# Patient Record
Sex: Female | Born: 1935 | Race: White | Hispanic: No | Smoking: Never smoker
Health system: Southern US, Community
[De-identification: ages and names within clinical notes are randomized; demographics above are authoritative.]

## PROBLEM LIST (undated history)

## (undated) DIAGNOSIS — I10 Essential (primary) hypertension: Secondary | ICD-10-CM

## (undated) DIAGNOSIS — T7840XA Allergy, unspecified, initial encounter: Secondary | ICD-10-CM

## (undated) DIAGNOSIS — F419 Anxiety disorder, unspecified: Secondary | ICD-10-CM

## (undated) DIAGNOSIS — K219 Gastro-esophageal reflux disease without esophagitis: Secondary | ICD-10-CM

## (undated) DIAGNOSIS — J189 Pneumonia, unspecified organism: Secondary | ICD-10-CM

## (undated) DIAGNOSIS — IMO0001 Reserved for inherently not codable concepts without codable children: Secondary | ICD-10-CM

## (undated) DIAGNOSIS — K648 Other hemorrhoids: Secondary | ICD-10-CM

## (undated) DIAGNOSIS — F32A Depression, unspecified: Secondary | ICD-10-CM

## (undated) DIAGNOSIS — F329 Major depressive disorder, single episode, unspecified: Secondary | ICD-10-CM

## (undated) DIAGNOSIS — K5790 Diverticulosis of intestine, part unspecified, without perforation or abscess without bleeding: Secondary | ICD-10-CM

## (undated) DIAGNOSIS — G4700 Insomnia, unspecified: Secondary | ICD-10-CM

## (undated) DIAGNOSIS — D649 Anemia, unspecified: Secondary | ICD-10-CM

## (undated) DIAGNOSIS — K635 Polyp of colon: Secondary | ICD-10-CM

## (undated) DIAGNOSIS — Z5189 Encounter for other specified aftercare: Secondary | ICD-10-CM

## (undated) DIAGNOSIS — M1990 Unspecified osteoarthritis, unspecified site: Secondary | ICD-10-CM

## (undated) HISTORY — DX: Allergy, unspecified, initial encounter: T78.40XA

## (undated) HISTORY — DX: Major depressive disorder, single episode, unspecified: F32.9

## (undated) HISTORY — PX: COLON SURGERY: SHX602

## (undated) HISTORY — DX: Other hemorrhoids: K64.8

## (undated) HISTORY — PX: UPPER GASTROINTESTINAL ENDOSCOPY: SHX188

## (undated) HISTORY — DX: Gastro-esophageal reflux disease without esophagitis: K21.9

## (undated) HISTORY — DX: Reserved for inherently not codable concepts without codable children: IMO0001

## (undated) HISTORY — DX: Polyp of colon: K63.5

## (undated) HISTORY — DX: Depression, unspecified: F32.A

## (undated) HISTORY — DX: Insomnia, unspecified: G47.00

## (undated) HISTORY — DX: Anemia, unspecified: D64.9

## (undated) HISTORY — DX: Diverticulosis of intestine, part unspecified, without perforation or abscess without bleeding: K57.90

## (undated) HISTORY — DX: Unspecified osteoarthritis, unspecified site: M19.90

## (undated) HISTORY — DX: Essential (primary) hypertension: I10

## (undated) HISTORY — PX: CHOLECYSTECTOMY: SHX55

---

## 1994-10-18 HISTORY — PX: LOW ANTERIOR BOWEL RESECTION: SUR1240

## 1998-08-18 ENCOUNTER — Other Ambulatory Visit: Admission: RE | Admit: 1998-08-18 | Discharge: 1998-08-18 | Payer: Self-pay | Admitting: Obstetrics and Gynecology

## 1998-10-02 ENCOUNTER — Other Ambulatory Visit: Admission: RE | Admit: 1998-10-02 | Discharge: 1998-10-02 | Payer: Self-pay | Admitting: Gastroenterology

## 1998-11-10 ENCOUNTER — Ambulatory Visit (HOSPITAL_COMMUNITY): Admission: RE | Admit: 1998-11-10 | Discharge: 1998-11-10 | Payer: Self-pay | Admitting: *Deleted

## 1999-04-01 ENCOUNTER — Ambulatory Visit (HOSPITAL_COMMUNITY): Admission: RE | Admit: 1999-04-01 | Discharge: 1999-04-01 | Payer: Self-pay | Admitting: Obstetrics and Gynecology

## 1999-04-01 ENCOUNTER — Encounter: Payer: Self-pay | Admitting: Obstetrics and Gynecology

## 1999-10-01 ENCOUNTER — Other Ambulatory Visit: Admission: RE | Admit: 1999-10-01 | Discharge: 1999-10-01 | Payer: Self-pay | Admitting: Obstetrics and Gynecology

## 1999-10-01 ENCOUNTER — Ambulatory Visit (HOSPITAL_COMMUNITY): Admission: RE | Admit: 1999-10-01 | Discharge: 1999-10-01 | Payer: Self-pay | Admitting: Family Medicine

## 1999-10-01 ENCOUNTER — Encounter: Payer: Self-pay | Admitting: Obstetrics and Gynecology

## 2001-10-20 ENCOUNTER — Other Ambulatory Visit: Admission: RE | Admit: 2001-10-20 | Discharge: 2001-10-20 | Payer: Self-pay | Admitting: Obstetrics and Gynecology

## 2002-06-04 ENCOUNTER — Encounter: Admission: RE | Admit: 2002-06-04 | Discharge: 2002-06-04 | Payer: Self-pay | Admitting: Obstetrics and Gynecology

## 2002-06-04 ENCOUNTER — Encounter: Payer: Self-pay | Admitting: Obstetrics and Gynecology

## 2004-01-03 ENCOUNTER — Other Ambulatory Visit: Admission: RE | Admit: 2004-01-03 | Discharge: 2004-01-03 | Payer: Self-pay | Admitting: Obstetrics and Gynecology

## 2004-08-27 ENCOUNTER — Ambulatory Visit: Payer: Self-pay | Admitting: Gastroenterology

## 2004-10-05 ENCOUNTER — Ambulatory Visit: Payer: Self-pay | Admitting: Gastroenterology

## 2005-01-20 ENCOUNTER — Other Ambulatory Visit: Admission: RE | Admit: 2005-01-20 | Discharge: 2005-01-20 | Payer: Self-pay | Admitting: Obstetrics and Gynecology

## 2005-08-19 ENCOUNTER — Ambulatory Visit: Payer: Self-pay | Admitting: Gastroenterology

## 2005-11-24 ENCOUNTER — Ambulatory Visit: Payer: Self-pay | Admitting: Gastroenterology

## 2006-01-04 ENCOUNTER — Ambulatory Visit: Payer: Self-pay | Admitting: Gastroenterology

## 2007-04-06 ENCOUNTER — Emergency Department (HOSPITAL_COMMUNITY): Admission: EM | Admit: 2007-04-06 | Discharge: 2007-04-07 | Payer: Self-pay | Admitting: Emergency Medicine

## 2007-04-14 ENCOUNTER — Emergency Department (HOSPITAL_COMMUNITY): Admission: EM | Admit: 2007-04-14 | Discharge: 2007-04-14 | Payer: Self-pay | Admitting: Emergency Medicine

## 2008-11-05 ENCOUNTER — Encounter: Admission: RE | Admit: 2008-11-05 | Discharge: 2008-11-05 | Payer: Self-pay | Admitting: Family Medicine

## 2010-01-20 ENCOUNTER — Encounter: Admission: RE | Admit: 2010-01-20 | Discharge: 2010-01-20 | Payer: Self-pay | Admitting: Family Medicine

## 2011-01-07 ENCOUNTER — Telehealth: Payer: Self-pay | Admitting: Internal Medicine

## 2011-01-07 NOTE — Telephone Encounter (Signed)
Susan Henry states pt was seen in their office today with blood in her stool.  Patient has history with Dr Corinda Gubler and will come in and see Dr Leone Payor tomorrow at 11:00.  Susan Henry will notify the patient and send records.

## 2011-01-08 ENCOUNTER — Other Ambulatory Visit (INDEPENDENT_AMBULATORY_CARE_PROVIDER_SITE_OTHER): Payer: Medicare Other | Admitting: Internal Medicine

## 2011-01-08 ENCOUNTER — Other Ambulatory Visit (INDEPENDENT_AMBULATORY_CARE_PROVIDER_SITE_OTHER): Payer: Medicare Other

## 2011-01-08 ENCOUNTER — Ambulatory Visit (INDEPENDENT_AMBULATORY_CARE_PROVIDER_SITE_OTHER): Payer: Medicare Other | Admitting: Internal Medicine

## 2011-01-08 ENCOUNTER — Encounter: Payer: Self-pay | Admitting: Internal Medicine

## 2011-01-08 VITALS — BP 126/64 | HR 60 | Ht 66.0 in | Wt 155.0 lb

## 2011-01-08 DIAGNOSIS — D649 Anemia, unspecified: Secondary | ICD-10-CM

## 2011-01-08 DIAGNOSIS — K648 Other hemorrhoids: Secondary | ICD-10-CM

## 2011-01-08 DIAGNOSIS — K625 Hemorrhage of anus and rectum: Secondary | ICD-10-CM

## 2011-01-08 DIAGNOSIS — D509 Iron deficiency anemia, unspecified: Secondary | ICD-10-CM

## 2011-01-08 HISTORY — DX: Other hemorrhoids: K64.8

## 2011-01-08 LAB — CBC WITH DIFFERENTIAL/PLATELET
Basophils Relative: 0.5 % (ref 0.0–3.0)
Eosinophils Relative: 1.2 % (ref 0.0–5.0)
Lymphocytes Relative: 25.7 % (ref 12.0–46.0)
MCV: 93.2 fl (ref 78.0–100.0)
Neutrophils Relative %: 64.9 % (ref 43.0–77.0)
RBC: 3.34 Mil/uL — ABNORMAL LOW (ref 3.87–5.11)
WBC: 6.5 10*3/uL (ref 4.5–10.5)

## 2011-01-08 LAB — VITAMIN B12: Vitamin B-12: 424 pg/mL (ref 211–911)

## 2011-01-08 LAB — IBC PANEL
Iron: 59 ug/dL (ref 42–145)
Saturation Ratios: 13 % — ABNORMAL LOW (ref 20.0–50.0)
Transferrin: 323.8 mg/dL (ref 212.0–360.0)

## 2011-01-08 MED ORDER — HYDROCORTISONE 2.5 % RE CREA: TOPICAL_CREAM | Freq: Two times a day (BID) | RECTAL | Status: AC

## 2011-01-08 NOTE — Assessment & Plan Note (Signed)
She has described a single episode of progressive blood on the toilet paper 3 days ago. She has hemorrhoids noted from prior colonoscopies in 2007 and 2001. Anoscopic exam today demonstrates inflamed internal hemorrhoids. I think this is the most likely cause of this rectal bleeding. Her story today is somewhat inconsistent with the notes from [place] , but she says she only had one episode of bleeding on the toilet paper and confirms that with me. Will treat with hydrocortisone cream at this point. She does have a very mild anemia with hemoglobin 11.1 and a normal MCV. Given that she had a colonoscopy in 2007 with only a 4 mm polyp, and internal/external hemorrhoids, I don't think we need to pursue a colonoscopy based upon what we know now. That may be needed depending upon the anemia workup. I will review her paper chart and records further. Her colonoscopy in December of 2001 showed a 4 mm polyp as well she does also have diverticulosis.

## 2011-01-08 NOTE — Assessment & Plan Note (Signed)
Her hemoglobin is 11.1 with a normal MCV on labs yesterday. I don't know if this is new or old at this point as I have no frame of reference. We'll repeat a CBC as well as iron 10 B12 studies and determine further plans pending that.

## 2011-01-08 NOTE — Patient Instructions (Signed)
You were slightly anemic but seems unlikely to be due to the one episode of rectal bleeding and hemorrhoids. Will recheck CBC and do anemia studies. Please pick up your hemorrhoid cream at the pharmacy. Let us know if there is persistent bleeding trouble or changes. We will notify you about lab results next week and will let Dr. Kevan Ny know also.

## 2011-01-08 NOTE — Progress Notes (Signed)
  Subjective:    Patient ID: Susan Henry, female    DOB: 1924/11/17, 77 y.o.   MRN: [id number]  Rectal Bleeding  The current episode started 3 to 5 days ago. The onset was sudden. The problem has been resolved. The patient is experiencing no pain. The stool is described as soft.    Bright red blood per rectum occurred one time 3 days ago. Saw Dr. Kevan Ny yesterday and was sent to me. CBC ordered, was told it was normal. No bleeding since then. Normal bowel movement since. Denies change in bowels, constipation or diarrhea recently. She cannot recall other bleeding. No abdominal pain. Blood was only on the toilet paper.  Review of Systems  Gastrointestinal: Positive for hematochezia.  Musculoskeletal: Positive for back pain.   all other review of systems negative or as per the history of present illness     Objective:   Physical Exam  Constitutional: She is oriented to person, place, and time. She appears well-developed and well-nourished.  HENT:  Head: Normocephalic.  Neck: Neck supple. No thyromegaly present.  Cardiovascular: Normal rate and regular rhythm.   Murmur heard.      2/6 RUSB murmur  Pulmonary/Chest: Effort normal and breath sounds normal.  Abdominal: Soft. Bowel sounds are normal. She exhibits no mass. There is no rebound and no guarding.  Genitourinary:        With female staff present there is a small tag in the anoderm. There is brown stool but no rectal mass and normal resting tone.  anoscopy is performed demonstrating inflamed irritated hemorrhoids and anal canal.  Musculoskeletal: She exhibits no edema.  Neurological: She is alert and oriented to person, place, and time.  Skin: Skin is warm and dry.  Psychiatric: She has a normal mood and affect. Her behavior is normal.          Assessment & Plan:

## 2011-01-11 ENCOUNTER — Telehealth: Payer: Self-pay | Admitting: Internal Medicine

## 2011-01-11 ENCOUNTER — Telehealth: Payer: Self-pay | Admitting: *Deleted

## 2011-01-11 MED ORDER — FERROUS SULFATE 325 (65 FE) MG PO TABS: 325.0000 mg | ORAL_TABLET | Freq: Two times a day (BID) | ORAL | Status: DC

## 2011-01-11 NOTE — Telephone Encounter (Signed)
Call patient and explain that iron is low. B12 was ok. She will need to be scheduled for a colonoscopy to investigate iron deficiency anemia. I still think bleeding episode was from hemorrhoids but even if that were not occurring a colonoscopy is appropriate. Depending upon what that sows she could need other testing but will wait for that. She will need to  ferrous sulfate 325 mg 2 times a day to replace iron (this is OTC) and I ordered it into med list She can have a pre-visit and then hold the ferrous sulfate as per protocol after that Stan Head

## 2011-01-12 NOTE — Telephone Encounter (Signed)
Notified pt of Dr Marvell Fuller recommendations for a COLON d/t her iron being low and that her B12 was ok. She will need to take ferrous sulfate 325mg , 1 bid.  PV scheduled for 02/01/11 @ 1100 and COLON for 02/16/11 @2pm . Per Dr Leone Payor, she will hold the Ferrous Sulfate per protocol for COLON. Pt stated understanding.

## 2011-01-13 ENCOUNTER — Telehealth: Payer: Self-pay | Admitting: Internal Medicine

## 2011-01-13 NOTE — Telephone Encounter (Signed)
Pt's daughter wanted an earlier appt for pt since she had Iron deficiency anemia and she didn't want pt to know. R/S pt for Pre Visit for 01/15/11 @ 1:30 and her  COLON, 01/20/11  2:30pm. Pt stated understanding.

## 2011-01-15 ENCOUNTER — Encounter: Payer: Self-pay | Admitting: Internal Medicine

## 2011-01-15 ENCOUNTER — Ambulatory Visit (AMBULATORY_SURGERY_CENTER): Payer: Medicare Other | Admitting: *Deleted

## 2011-01-15 VITALS — Ht 67.0 in | Wt 158.0 lb

## 2011-01-15 DIAGNOSIS — D509 Iron deficiency anemia, unspecified: Secondary | ICD-10-CM

## 2011-01-15 DIAGNOSIS — Z1211 Encounter for screening for malignant neoplasm of colon: Secondary | ICD-10-CM

## 2011-01-15 MED ORDER — PEG-KCL-NACL-NASULF-NA ASC-C 100 G PO SOLR: 1.0000 | Freq: Once | ORAL | Status: AC

## 2011-01-17 HISTORY — PX: COLONOSCOPY: SHX174

## 2011-01-18 ENCOUNTER — Telehealth: Payer: Self-pay | Admitting: Internal Medicine

## 2011-01-18 NOTE — Telephone Encounter (Signed)
Unable to reach pt- no answering machine- will call back.

## 2011-01-18 NOTE — Telephone Encounter (Signed)
Should not be a problem re: ferrous sulfate. Ok to proceed wih colonoscopy No more ferrous sulfate for now

## 2011-01-18 NOTE — Telephone Encounter (Signed)
Notified pt's husband Dr Leone Payor stated to stop the iron now until her procedure; he stated understanding.

## 2011-01-18 NOTE — Telephone Encounter (Signed)
Pt's husband called to ask about the iron supplement pt is taking. Pt has her COLON on 01/20/11 @ 2:30pm. Pt was r/s to an earlier day per her daughter's request- previous appt was 02/16/11. Daughter didn't want her mom waiting that long, but she didn't want me to tell her mom she had called. I informed pt we had an earlier appt d/t cancellations. Anyway, pt should have started the ferrous sulfate around 01/12/11. Husband read the pre visit notes and stated to stop the iron 2 weeks prior to COLON. Pt could not have been on drug < 1 week, will this make a difference? Thanks.

## 2011-01-20 ENCOUNTER — Encounter: Payer: Self-pay | Admitting: Internal Medicine

## 2011-01-20 ENCOUNTER — Ambulatory Visit (AMBULATORY_SURGERY_CENTER): Payer: Medicare Other | Admitting: Internal Medicine

## 2011-01-20 VITALS — BP 149/84 | HR 64 | Temp 98.1°F | Resp 20 | Ht 67.0 in | Wt 170.0 lb

## 2011-01-20 DIAGNOSIS — K5730 Diverticulosis of large intestine without perforation or abscess without bleeding: Secondary | ICD-10-CM

## 2011-01-20 DIAGNOSIS — K625 Hemorrhage of anus and rectum: Secondary | ICD-10-CM

## 2011-01-20 DIAGNOSIS — K648 Other hemorrhoids: Secondary | ICD-10-CM

## 2011-01-20 DIAGNOSIS — D649 Anemia, unspecified: Secondary | ICD-10-CM

## 2011-01-20 DIAGNOSIS — D509 Iron deficiency anemia, unspecified: Secondary | ICD-10-CM

## 2011-01-20 MED ORDER — SODIUM CHLORIDE 0.9 % IV SOLN: 500.0000 mL | INTRAVENOUS | Status: DC

## 2011-01-20 NOTE — Patient Instructions (Signed)
No cause of your iron-deficiency anemia was seen on the colonoscopy today. It may be due to lack of iron ingestion but it is appropriate for you to schedule an upper GI endoscopy to evaluate for microscopic blood loss from the upper GI tract. My office will schedule that if you agree.

## 2011-01-21 ENCOUNTER — Telehealth: Payer: Self-pay | Admitting: *Deleted

## 2011-01-21 NOTE — Telephone Encounter (Signed)

## 2011-01-27 ENCOUNTER — Telehealth: Payer: Self-pay

## 2011-01-27 NOTE — Telephone Encounter (Signed)
No answer and no machine, I will continue to try and reach the patient 

## 2011-01-28 NOTE — Telephone Encounter (Signed)
I spoke with the patient about setting up an EGD as suggested on her colonoscopy report.  Patient declines to schedule at this time.

## 2011-02-16 ENCOUNTER — Other Ambulatory Visit: Payer: Medicare Other | Admitting: Internal Medicine

## 2011-02-23 ENCOUNTER — Encounter: Payer: Self-pay | Admitting: Internal Medicine

## 2011-02-23 NOTE — Telephone Encounter (Signed)
This encounter was created in error - please disregard.

## 2011-03-16 ENCOUNTER — Emergency Department (HOSPITAL_COMMUNITY)
Admission: EM | Admit: 2011-03-16 | Discharge: 2011-03-16 | Disposition: A | Discharge status: Home / Self Care | Payer: Medicare Other | Attending: Emergency Medicine | Admitting: Emergency Medicine

## 2011-03-16 ENCOUNTER — Emergency Department (HOSPITAL_COMMUNITY): Payer: Medicare Other

## 2011-03-16 DIAGNOSIS — W010XXA Fall on same level from slipping, tripping and stumbling without subsequent striking against object, initial encounter: Secondary | ICD-10-CM | POA: Insufficient documentation

## 2011-03-16 DIAGNOSIS — I10 Essential (primary) hypertension: Secondary | ICD-10-CM | POA: Insufficient documentation

## 2011-03-16 DIAGNOSIS — S01501A Unspecified open wound of lip, initial encounter: Secondary | ICD-10-CM | POA: Insufficient documentation

## 2011-03-16 DIAGNOSIS — S0003XA Contusion of scalp, initial encounter: Secondary | ICD-10-CM | POA: Insufficient documentation

## 2011-03-16 DIAGNOSIS — S0083XA Contusion of other part of head, initial encounter: Secondary | ICD-10-CM | POA: Insufficient documentation

## 2011-11-03 ENCOUNTER — Other Ambulatory Visit: Payer: Self-pay | Admitting: Internal Medicine

## 2011-11-04 NOTE — Telephone Encounter (Signed)
Called the patient to see what medication she needed a refill on. Patient couldn't give me any kind of information, she didn't know what medication it was except that the pharmacy told her it was not habit forming. I told the patient that I couldn't do anything if she couldn't tell me what medication she needed. Patient requested a copy of her medication list mailed to her maybe then she should be able to figure it out.

## 2011-11-10 ENCOUNTER — Other Ambulatory Visit: Payer: Self-pay | Admitting: Obstetrics and Gynecology

## 2011-11-16 ENCOUNTER — Inpatient Hospital Stay (HOSPITAL_COMMUNITY)
Admission: AD | Admit: 2011-11-16 | Discharge: 2011-11-25 | DRG: 378 | Disposition: A | Discharge status: Home / Self Care | Payer: Medicare Other | Source: Ambulatory Visit | Attending: Internal Medicine | Admitting: Internal Medicine

## 2011-11-16 ENCOUNTER — Encounter: Payer: Self-pay | Admitting: Internal Medicine

## 2011-11-16 ENCOUNTER — Ambulatory Visit (INDEPENDENT_AMBULATORY_CARE_PROVIDER_SITE_OTHER): Payer: Medicare Other | Admitting: Internal Medicine

## 2011-11-16 DIAGNOSIS — F3289 Other specified depressive episodes: Secondary | ICD-10-CM | POA: Diagnosis present

## 2011-11-16 DIAGNOSIS — K922 Gastrointestinal hemorrhage, unspecified: Secondary | ICD-10-CM | POA: Diagnosis present

## 2011-11-16 DIAGNOSIS — E785 Hyperlipidemia, unspecified: Secondary | ICD-10-CM | POA: Diagnosis present

## 2011-11-16 DIAGNOSIS — M129 Arthropathy, unspecified: Secondary | ICD-10-CM | POA: Diagnosis present

## 2011-11-16 DIAGNOSIS — K5731 Diverticulosis of large intestine without perforation or abscess with bleeding: Principal | ICD-10-CM | POA: Diagnosis present

## 2011-11-16 DIAGNOSIS — F329 Major depressive disorder, single episode, unspecified: Secondary | ICD-10-CM | POA: Diagnosis present

## 2011-11-16 DIAGNOSIS — D62 Acute posthemorrhagic anemia: Secondary | ICD-10-CM | POA: Diagnosis not present

## 2011-11-16 DIAGNOSIS — Z860100 Personal history of colon polyps, unspecified: Secondary | ICD-10-CM

## 2011-11-16 DIAGNOSIS — K5730 Diverticulosis of large intestine without perforation or abscess without bleeding: Secondary | ICD-10-CM

## 2011-11-16 DIAGNOSIS — I10 Essential (primary) hypertension: Secondary | ICD-10-CM | POA: Insufficient documentation

## 2011-11-16 DIAGNOSIS — K219 Gastro-esophageal reflux disease without esophagitis: Secondary | ICD-10-CM | POA: Diagnosis present

## 2011-11-16 HISTORY — DX: Pneumonia, unspecified organism: J18.9

## 2011-11-16 HISTORY — DX: Encounter for other specified aftercare: Z51.89

## 2011-11-16 HISTORY — DX: Anxiety disorder, unspecified: F41.9

## 2011-11-16 HISTORY — DX: Reserved for inherently not codable concepts without codable children: IMO0001

## 2011-11-16 LAB — CBC
HCT: 38.3 % (ref 36.0–46.0)
MCHC: 33.7 g/dL (ref 30.0–36.0)
MCV: 97 fL (ref 78.0–100.0)
RDW: 12.5 % (ref 11.5–15.5)

## 2011-11-16 LAB — COMPREHENSIVE METABOLIC PANEL
AST: 23 U/L (ref 0–37)
Albumin: 3.9 g/dL (ref 3.5–5.2)
Alkaline Phosphatase: 99 U/L (ref 39–117)
BUN: 17 mg/dL (ref 6–23)
Chloride: 98 mEq/L (ref 96–112)
Creatinine, Ser: 0.77 mg/dL (ref 0.50–1.10)
GFR calc Af Amer: 87 mL/min — ABNORMAL LOW (ref >90)
Glucose, Bld: 123 mg/dL — ABNORMAL HIGH (ref 70–99)
Total Bilirubin: 0.4 mg/dL (ref 0.3–1.2)
Total Protein: 8 g/dL (ref 6.0–8.3)

## 2011-11-16 LAB — ABO/RH: ABO/RH(D): A POS

## 2011-11-16 LAB — PROTIME-INR
INR: 1.02 (ref 0.00–1.49)
Prothrombin Time: 13.6 seconds (ref 11.6–15.2)

## 2011-11-16 MED ORDER — ONDANSETRON HCL 4 MG PO TABS: 4.0000 mg | ORAL_TABLET | Freq: Four times a day (QID) | ORAL | Status: DC | PRN

## 2011-11-16 MED ORDER — LORAZEPAM 1 MG PO TABS
1.0000 mg | ORAL_TABLET | Freq: Three times a day (TID) | ORAL | Status: DC | PRN
  Administered 2011-11-16 – 2011-11-24 (×8): 1 mg via ORAL
  Filled 2011-11-16 (×9): qty 1

## 2011-11-16 MED ORDER — FLUTICASONE PROPIONATE 50 MCG/ACT NA SUSP
2.0000 | Freq: Every day | NASAL | Status: DC
  Administered 2011-11-20 – 2011-11-24 (×3): 2 via NASAL
  Filled 2011-11-16: qty 16

## 2011-11-16 MED ORDER — CITALOPRAM HYDROBROMIDE 20 MG PO TABS
20.0000 mg | ORAL_TABLET | Freq: Every day | ORAL | Status: DC
  Filled 2011-11-16: qty 1

## 2011-11-16 MED ORDER — ONDANSETRON HCL 4 MG/2ML IJ SOLN: 4.0000 mg | Freq: Three times a day (TID) | INTRAMUSCULAR | Status: DC | PRN

## 2011-11-16 MED ORDER — ACETAMINOPHEN 325 MG PO TABS
650.0000 mg | ORAL_TABLET | Freq: Four times a day (QID) | ORAL | Status: DC | PRN
  Administered 2011-11-16 – 2011-11-17 (×2): 650 mg via ORAL
  Filled 2011-11-16 (×3): qty 2

## 2011-11-16 MED ORDER — PANTOPRAZOLE SODIUM 40 MG PO TBEC
40.0000 mg | DELAYED_RELEASE_TABLET | Freq: Every day | ORAL | Status: DC
  Administered 2011-11-17 – 2011-11-25 (×8): 40 mg via ORAL
  Filled 2011-11-16 (×12): qty 1

## 2011-11-16 MED ORDER — ACETAMINOPHEN 650 MG RE SUPP: 650.0000 mg | Freq: Four times a day (QID) | RECTAL | Status: DC | PRN

## 2011-11-16 MED ORDER — HYDROCODONE-ACETAMINOPHEN 5-325 MG PO TABS: 1.0000 | ORAL_TABLET | ORAL | Status: DC | PRN

## 2011-11-16 MED ORDER — FUROSEMIDE 20 MG PO TABS
20.0000 mg | ORAL_TABLET | Freq: Every day | ORAL | Status: DC
  Administered 2011-11-17 – 2011-11-24 (×7): 20 mg via ORAL
  Filled 2011-11-16 (×10): qty 1

## 2011-11-16 MED ORDER — KCL IN DEXTROSE-NACL 10-5-0.45 MEQ/L-%-% IV SOLN
INTRAVENOUS | Status: DC
  Administered 2011-11-16 – 2011-11-18 (×4): via INTRAVENOUS
  Filled 2011-11-16 (×8): qty 1000

## 2011-11-16 NOTE — Progress Notes (Addendum)
Subjective:    Patient ID: Susan Henry, female    DOB: July 16, 1925, 78 y.o.   MRN: [id number]  HPI Elderly white woman with known hemorrhoids. She had a colonoscopy in 01/2011. She developed rectal bleeding this AM. Bright red blood on the toilet paper. Daughter is here and says that the patient's that she saw blood in the stool in the commode.Mild abdominal cramps. Denies constipation or bowel habit changes. No heavy lifting. Denies other episodes of bleeding, just wants this morning. She was last seen in the spring of 2012. She had some rectal bleeding that I thought was from hemorrhoids. She underwent a colonoscopy because she had a low ferritin, and a hemoglobin of 10. It demonstrated hemorrhoids and diverticulosis. I recommended an upper endoscopy for completeness but she did not follow through with that. I do not know what happened with her anemia since then. She has since stopped taking iron. She denies lightheadedness, chest pain or significant dyspnea. She is somewhat anxious over the bleeding. Allergies  Allergen Reactions  . Alprazolam Er Rash   Outpatient Prescriptions Prior to Visit  Medication Sig Dispense Refill  . amLODipine (NORVASC) 5 MG tablet Take 5 mg by mouth daily.        . bisoprolol (ZEBETA) 5 MG tablet Take 5 mg by mouth daily.        . Calcium Carbonate (CALCIUM 600 PO) Take 1 tablet by mouth daily.        . citalopram (CELEXA) 20 MG tablet Take 20 mg by mouth daily.        . ergocalciferol (VITAMIN D2) 50000 UNITS capsule Take 50,000 Units by mouth once a week.        . furosemide (LASIX) 20 MG tablet Take 20 mg by mouth daily.        Marland Kitchen lisinopril (PRINIVIL,ZESTRIL) 20 MG tablet Take 20 mg by mouth daily.        . RABEprazole (ACIPHEX) 20 MG tablet Take 20 mg by mouth daily.        . ferrous sulfate 325 (65 FE) MG tablet Take 1 tablet (325 mg total) by mouth 2 (two) times daily.      . fluticasone (FLONASE) 50 MCG/ACT nasal spray 2 sprays by Nasal route daily.         Marland Kitchen LORazepam (ATIVAN) 1 MG tablet Take 1 mg by mouth every 8 (eight) hours.        . Multiple Vitamins-Minerals (MULTIVITAL PO) Take 1 tablet by mouth daily.        Marland Kitchen PROCTOSOL HC 2.5 % rectal cream Place 1 application rectally at bedtime. Prefers suppositories       Facility-Administered Medications Prior to Visit  Medication Dose Route Frequency Provider Last Rate Last Dose  . 0.9 %  sodium chloride infusion  500 mL Intravenous Continuous Iva Boop, MD       Past Medical History  Diagnosis Date  . Hypertension   . Reflux   . Hyperlipidemia   . Depression   . Aortic sclerosis   . Insomnia   . Colon polyps     Hx of  adenomatous  . Diverticulosis   . Hemorrhoids, internal, with bleeding 01/08/2011  . Arthritis   . GERD (gastroesophageal reflux disease)   . Anemia     Iron deficiency   . Allergy     Seasonal   Past Surgical History  Procedure Date  . Cholecystectomy   . Upper gastrointestinal endoscopy   . Low anterior  resection for diverticulitis   . Colonoscopy 01/2011   History   Social History  . Marital Status: Married, adult daughter here today             .     Occupational History  . Unemployed      Homemaker   Social History Main Topics  . Smoking status: Never Smoker   . Smokeless tobacco: Never Used  . Alcohol Use: 3.5 oz/week    7 drink(s) per week     rare  . Drug Use: No  .       Family History  Problem Relation Age of Onset  . Colon cancer Neg Hx   . Pancreatic cancer Father          Review of Systems Mild cough lately, question allergies. Wears eyeglasses. All other review of systems appears negative at this time. Except as mentioned in the history of present illness.    Objective:   Physical Exam General:  Well-developed, well-nourished and in no acute distress Eyes:  anicteric. ENT:   Mouth and posterior pharynx free of lesions.  Neck:   supple w/o thyromegaly or mass.  Lungs: Clear to auscultation  bilaterally. Heart:  S1S2, no rubs, murmurs, gallops. Abdomen:  soft, non-tender, no hepatosplenomegaly, hernia, or mass and BS+.  Rectal: With female staff present, normal anal determine whether there is a slight amount of blood on the outside. Rectal exam shows maroon stool. Anoscopy confirms this, her hemorrhoids do   not look active. Lymph:  no cervical or supraclavicular adenopathy. Extremities:   no edema Neuro:  A&O x 3. , Grossly nonfocal Psych:  appropriate mood and affect.   Last colonoscopy 01/20/2011. Severe left-sided diverticulosis, mild right-sided diverticulosis, internal hemorrhoids.      Assessment & Plan:   1. Gastrointestinal hemorrhage    This sounds like a diverticular bleed as it is painless and she has known diverticulosis. The color of stools meds that. I did not get a history of melena. She needs to be admitted to the hospital for close observation and consideration for further workup depending upon the clinical course. Will hold her antihypertensives, give IV fluids, put on a clear liquid diet and sort things out further as the clinical course progresses. Her daughter and husband are here in and explain this to all 3. She could need repeat endoscopy, upper endoscopy versus colonoscopy or other studies to try to determine the source of bleeding. Typically diverticular bleeding is self-limited and if that is the overall course would observe and support if that is possible. Her Celexa is a bleeding risk factor, and will hold that at least temporarily, monitoring for withdrawal

## 2011-11-16 NOTE — Patient Instructions (Signed)
Go to [facility] admitting for bed placement (in-patient) for GI bleed. You will be admitted to [address].

## 2011-11-16 NOTE — Progress Notes (Signed)
Addended by: Sammuel Cooper on: 11/16/2011 04:39 PM   Modules accepted: Orders

## 2011-11-16 NOTE — H&P (Signed)
[facility] History and Physical    Patient ID: Susan Henry, female    DOB: November 25, 1924, 78 y.o.   MRN: [id number]   HPI Elderly white woman with known hemorrhoids. She had a colonoscopy in 01/2011. She developed rectal bleeding this AM. Bright red blood on the toilet paper. Daughter is here and says that the patient's that she saw blood in the stool in the commode.Mild abdominal cramps. Denies constipation or bowel habit changes. No heavy lifting. Denies other episodes of bleeding, just wants this morning. She was last seen in the spring of 2012. She had some rectal bleeding that I thought was from hemorrhoids. She underwent a colonoscopy because she had a low ferritin, and a hemoglobin of 10. It demonstrated hemorrhoids and diverticulosis. I recommended an upper endoscopy for completeness but she did not follow through with that. I do not know what happened with her anemia since then. She has since stopped taking iron. She denies lightheadedness, chest pain or significant dyspnea. She is somewhat anxious over the bleeding.  Allergies   Allergen  Reactions   .  Alprazolam Er  Rash    Outpatient Prescriptions Prior to Visit   Medication  Sig  Dispense  Refill   .  amLODipine (NORVASC) 5 MG tablet  Take 5 mg by mouth daily.           .  bisoprolol (ZEBETA) 5 MG tablet  Take 5 mg by mouth daily.           .  Calcium Carbonate (CALCIUM 600 PO)  Take 1 tablet by mouth daily.           .  citalopram (CELEXA) 20 MG tablet  Take 20 mg by mouth daily.           .  ergocalciferol (VITAMIN D2) 50000 UNITS capsule  Take 50,000 Units by mouth once a week.           .  furosemide (LASIX) 20 MG tablet  Take 20 mg by mouth daily.           Marland Kitchen  lisinopril (PRINIVIL,ZESTRIL) 20 MG tablet  Take 20 mg by mouth daily.           .  RABEprazole (ACIPHEX) 20 MG tablet  Take 20 mg by mouth daily.           .  ferrous sulfate 325 (65 FE) MG tablet  Take 1 tablet (325 mg total) by mouth 2 (two) times  daily.         .  fluticasone (FLONASE) 50 MCG/ACT nasal spray  2 sprays by Nasal route daily.           Marland Kitchen  LORazepam (ATIVAN) 1 MG tablet  Take 1 mg by mouth every 8 (eight) hours.           .  Multiple Vitamins-Minerals (MULTIVITAL PO)  Take 1 tablet by mouth daily.           Marland Kitchen  PROCTOSOL HC 2.5 % rectal cream  Place 1 application rectally at bedtime. Prefers suppositories             Facility-Administered Medications Prior to Visit   Medication  Dose  Route  Frequency  Provider  Last Rate  Last Dose   .  0.9 %  sodium chloride infusion   500 mL  Intravenous  Continuous  Iva Boop, MD          Past Medical History  Diagnosis  Date   .  Hypertension     .  Reflux     .  Hyperlipidemia     .  Depression     .  Aortic sclerosis     .  Insomnia     .  Colon polyps         Hx of  adenomatous   .  Diverticulosis     .  Hemorrhoids, internal, with bleeding  01/08/2011   .  Arthritis     .  GERD (gastroesophageal reflux disease)     .  Anemia         Iron deficiency    .  Allergy         Seasonal    Past Surgical History   Procedure  Date   .  Cholecystectomy     .  Upper gastrointestinal endoscopy     .  Low anterior resection for diverticulitis     .  Colonoscopy  01/2011       History       Social History   .  Marital Status:  Married, adult daughter here today                        Occupational History   .  Unemployed          Homemaker       Social History Main Topics   .  Smoking status:  Never Smoker    .  Smokeless tobacco:  Never Used   .  Alcohol Use:  3.5 oz/week       7 drink(s) per week         rare   .  Drug Use:  No            Family History   Problem  Relation  Age of Onset   .  Colon cancer  Neg Hx     .  Pancreatic cancer  Father        Review of Systems Mild cough lately, question allergies. Wears eyeglasses. All other review of systems appears negative at this time. Except as mentioned in the history of present illness.     Objective:    Physical Exam     BP Pulse Ht Wt BMI    128/64  68  5\' 6"  (1.676 m)  163 lb (73.936 kg)  26.31 kg/m2   General:         Well-developed, well-nourished and in no acute distress Eyes:               anicteric. ENT:               Mouth and posterior pharynx free of lesions.  Neck:              supple w/o thyromegaly or mass.  Lungs:            Clear to auscultation bilaterally. Heart:             S1S2, no rubs, murmurs, gallops. Abdomen:        soft, non-tender, no hepatosplenomegaly, hernia, or mass and BS+.  Rectal:            With female staff present, normal anal determine whether there is a slight amount of blood on the outside. Rectal exam shows maroon stool. Anoscopy confirms this, her hemorrhoids do  not look active. Lymph:            no cervical or supraclavicular adenopathy. Extremities:   no edema Neuro:            A&O x 3. , Grossly nonfocal Psych:             appropriate mood and affect.     Last colonoscopy 01/20/2011. Severe left-sided diverticulosis, mild right-sided diverticulosis, internal hemorrhoids.       Assessment & Plan:       1.  Gastrointestinal hemorrhage     This sounds like a diverticular bleed as it is painless and she has known diverticulosis. The color of stools meds that. I did not get a history of melena. She needs to be admitted to the hospital for close observation and consideration for further workup depending upon the clinical course. Will hold her antihypertensives, give IV fluids, put on a clear liquid diet and sort things out further as the clinical course progresses. Her daughter and husband are here in and explain this to all 3. She could need repeat endoscopy, upper endoscopy versus colonoscopy or other studies to try to determine the source of bleeding. Typically diverticular bleeding is self-limited and if that is the overall course would observe and support if that is possible. Her Celexa is a bleeding  risk factor, and will hold that at least temporarily, monitoring for withdrawal   Iva Boop, MD, [facility] [phone] (pager) 11/16/2011 5:29 PM

## 2011-11-17 LAB — HEMOGLOBIN AND HEMATOCRIT, BLOOD
Hemoglobin: 11.3 g/dL — ABNORMAL LOW (ref 12.0–15.0)
Hemoglobin: 10.7 g/dL — ABNORMAL LOW (ref 12.0–15.0)

## 2011-11-17 NOTE — Progress Notes (Signed)
Patient ID: Susan Henry, female   DOB: October 14, 1925, 78 y.o.   MRN: [id number] [facility] Progress Note  Subjective: Doing well, she had a few Bm's last evening and apparently 2 bloody Bm's early this am. She feels fine, no c/o cramping,weakness etc.  Objective:  Vital signs in last 24 hours: Temp:  [97.4 F (36.3 C)-98.2 F (36.8 C)] 98.2 F (36.8 C) (01/30 0607) Pulse Rate:  [63-74] 63  (01/30 0607) Resp:  [18] 18  (01/30 0607) BP: (128-166)/(64-87) 159/74 mmHg (01/30 0607) SpO2:  [92 %-96 %] 96 % (01/30 0607) Weight:  [73.936 kg (163 lb)] 73.936 kg (163 lb) (01/29 1440) Last BM Date: 11/16/11 (bloody stools) General:   Alert,  Well-developed,    in NAD Heart:  Regular rate and rhythm; no murmurs Pulm;clear Abdomen:  Soft, nontender and nondistended. Normal bowel sounds Extremities:  Without edema. Neurologic:  Alert and  oriented x4;  grossly normal neurologically. Psych:  Alert and cooperative. Normal mood and affect.  Intake/Output from previous day:   Intake/Output this shift:    Lab Results:  Basename 11/17/11 0804 11/16/11 2305 11/16/11 1830  WBC -- -- 7.2  HGB 11.3* 12.2 12.9  HCT 34.1* 36.4 38.3  PLT -- -- 343   BMET  Basename 11/16/11 1830  NA 137  K 3.7  CL 98  CO2 28  GLUCOSE 123*  BUN 17  CREATININE 0.77  CALCIUM 9.6   LFT  Basename 11/16/11 1830  PROT 8.0  ALBUMIN 3.9  AST 23  ALT 19  ALKPHOS 99  BILITOT 0.4  BILIDIR --  IBILI --   PT/INR  Basename 11/16/11 1830  LABPROT 13.6  INR 1.02    Assessment / Plan: #1 78 yo female with acute lower Gi bleed-very likely diverticular in origin. She is stable ,hgb down 2 grams.  She had a colonoscopy in 4/12, will observe today,if persistent bleding then bleeding scan  Continue clear liquids,serial hgb's, and transfuse as needed. Discussed with pt and family. Active Problems:  GI hemorrhage     LOS: 1 day   [name]  11/17/2011, 9:17 AM    ________________________________________________________________________  Corinda Gubler [facility] MD note:  I personally examined the patient, reviewed the data and agree with the assessment and plan described above.  Continues to have rectal bleeding, pretty small volume and Hb is >11 still.  Will observe throughout the day, if she has persistent bleeding then will plan to prep for colonoscopy tomorrow to try to find and stop the bleeding site.   [name], MD [facility] Pager [phone]

## 2011-11-17 NOTE — Progress Notes (Signed)
Patient reports attempting bowel movements twice this a.m., and just evacuating blood. Nurse tech commented that it was "a lot" of blood.  Collection container placed in commode, and orders given to Nurse tech to save any future evacuations.  Will continue to monitor.

## 2011-11-18 ENCOUNTER — Encounter (HOSPITAL_COMMUNITY): Payer: Self-pay | Admitting: *Deleted

## 2011-11-18 ENCOUNTER — Encounter (HOSPITAL_COMMUNITY)
Admission: AD | Disposition: A | Discharge status: Home / Self Care | Payer: Self-pay | Source: Ambulatory Visit | Attending: Internal Medicine

## 2011-11-18 DIAGNOSIS — K5730 Diverticulosis of large intestine without perforation or abscess without bleeding: Secondary | ICD-10-CM

## 2011-11-18 HISTORY — PX: COLONOSCOPY: SHX5424

## 2011-11-18 HISTORY — PX: ESOPHAGOGASTRODUODENOSCOPY: SHX5428

## 2011-11-18 LAB — HEMOGLOBIN AND HEMATOCRIT, BLOOD
HCT: 31.6 % — ABNORMAL LOW (ref 36.0–46.0)
Hemoglobin: 10.2 g/dL — ABNORMAL LOW (ref 12.0–15.0)
Hemoglobin: 10.7 g/dL — ABNORMAL LOW (ref 12.0–15.0)

## 2011-11-18 SURGERY — COLONOSCOPY
Anesthesia: Moderate Sedation

## 2011-11-18 MED ORDER — PEG-KCL-NACL-NASULF-NA ASC-C 100 G PO SOLR
1.0000 | Freq: Once | ORAL | Status: AC
  Administered 2011-11-18: 100 g via ORAL
  Filled 2011-11-18: qty 1

## 2011-11-18 MED ORDER — FENTANYL NICU IV SYRINGE 50 MCG/ML
INJECTION | INTRAMUSCULAR | Status: DC | PRN
  Administered 2011-11-18 (×2): 25 ug via INTRAVENOUS

## 2011-11-18 MED ORDER — MIDAZOLAM HCL 10 MG/2ML IJ SOLN
INTRAMUSCULAR | Status: AC
  Filled 2011-11-18: qty 2

## 2011-11-18 MED ORDER — MIDAZOLAM HCL 10 MG/2ML IJ SOLN
INTRAMUSCULAR | Status: DC | PRN
  Administered 2011-11-18 (×2): 1 mg via INTRAVENOUS

## 2011-11-18 MED ORDER — FENTANYL CITRATE 0.05 MG/ML IJ SOLN
INTRAMUSCULAR | Status: AC
  Filled 2011-11-18: qty 4

## 2011-11-18 NOTE — Op Note (Signed)
[facility] [address] [place], [place]  [address]  COLONOSCOPY PROCEDURE REPORT  PATIENT:  Susan Henry, Glynn  MR#:  [id number] BIRTHDATE:  [birth year], 86 yrs. old  GENDER:  female ENDOSCOPIST:  Rachael Fee, MD PROCEDURE DATE:  11/18/2011 PROCEDURE:  Colonoscopy 19147 ASA CLASS:  Class III INDICATIONS:  red rectal bleeding, known diverticulosis MEDICATIONS:   Fentanyl 50 mcg IV, Versed 2 mg IV  DESCRIPTION OF PROCEDURE:   After the risks benefits and alternatives of the procedure were thoroughly explained, informed consent was obtained.  Digital rectal exam was performed and revealed no rectal masses.   The  endoscope was introduced through the anus and advanced to the terminal ileum which was intubated for a short distance, without limitations.  The quality of the prep was fair..  The instrument was then slowly withdrawn as the colon was fully examined. <<PROCEDUREIMAGES>> FINDINGS:  Scattered diverticula were found throughout the colon. There were dozens of large and small diverticulum throughout the colon. There was recent, red blood also throughout the colon. There was red blood in terminal ileum. I washed the colon extensively, going into and out of the colon twice and could never find the exact site of bleeding (see image1, image2, image3, image4, image5, and image6).   Retroflexed views in the rectum revealed no abnormalities. COMPLICATIONS:  None  ENDOSCOPIC IMPRESSION: 1) Diverticula, scattered throughout the colon. There was red bood throughout colon and also in terminal ileum.  It is VERY unlikely she is having an UGI bleed (bright red blood in colon, hemodynamically stable).  I suspect diverticular bleed with TI "backwash." The exact site of bleeding was not identified however.  RECOMMENDATIONS: Will transfuse one unit blood overnight. This will be the first unit she's needed. If she continues to bleeding, then nuc med scan to help localize the  bleeding.  _____________________________ Rachael Fee, MD  cc: Stan Head, MD  n. eSIGNED:   Rachael Fee at 11/18/2011 04:47 PM  Santiago Bumpers, [phone]

## 2011-11-18 NOTE — Interval H&P Note (Signed)
History and Physical Interval Note:  11/18/2011 3:46 PM  Susan Henry  has presented today for surgery, with the diagnosis of gi bleed  The various methods of treatment have been discussed with the patient and family. After consideration of risks, benefits and other options for treatment, the patient has consented to  Procedure(s): COLONOSCOPY ESOPHAGOGASTRODUODENOSCOPY (EGD) as a surgical intervention .  The patients' history has been reviewed, patient examined, no change in status, stable for surgery.  I have reviewed the patients' chart and labs.  Questions were answered to the patient's satisfaction.     [name]

## 2011-11-18 NOTE — Progress Notes (Signed)
Patient ID: Susan Henry, female   DOB: [birth year], 78 y.o.   MRN: [id number] [facility] Progress Note  Subjective: No c/o abdominal pain ,cramping. Feels OK. Nurse repots one dark bloody stool last pm around 7, then one l;arger bloody stool this am. Hgb down to 10.2.  Objective:  Vital signs in last 24 hours: Temp:  [97.5 F (36.4 C)-98.3 F (36.8 C)] 97.5 F (36.4 C) (01/31 0601) Pulse Rate:  [62-66] 66  (01/31 0601) Resp:  [18] 18  (01/31 0601) BP: (125-151)/(69-82) 128/69 mmHg (01/31 0601) SpO2:  [93 %-98 %] 97 % (01/31 0601) Last BM Date: 11/17/11 General:   Alert,  Well-developed,    in NAD Heart:  Regular rate and rhythm; no murmurs Pulm;clear Abdomen:  Soft, nontender and nondistended. Normal bowel sounds, without guarding, Extremities:  Without edema. Neurologic:  Alert and  oriented x4;  grossly normal neurologically. Psych:  Alert and cooperative. Normal mood and affect.  Intake/Output from previous day: 01/30 0701 - 01/31 0700 In: 3336 [P.O.:360; I.V.:2976] Out: -  Intake/Output this shift:    Lab Results:  Basename 11/18/11 0750 11/17/11 2320 11/17/11 1530 11/16/11 1830  WBC -- -- -- 7.2  HGB 10.2* 10.7* 10.6* --  HCT 31.6* 31.8* 32.5* --  PLT -- -- -- 343   BMET  Basename 11/16/11 1830  NA 137  K 3.7  CL 98  CO2 28  GLUCOSE 123*  BUN 17  CREATININE 0.77  CALCIUM 9.6   LFT  Basename 11/16/11 1830  PROT 8.0  ALBUMIN 3.9  AST 23  ALT 19  ALKPHOS 99  BILITOT 0.4  BILIDIR --  IBILI --   PT/INR  Basename 11/16/11 1830  LABPROT 13.6  INR 1.02   Assessment / Plan: #1 78 yo female with presumed diverticular bleed, continued slow bleed.  Will prep this am for colonoscopy, and possible EGD late this afternoon. #2 Anemia- secondary to blood loss, no transfusion requirement as yet. Active Problems:  GI hemorrhage     LOS: 2 days   [name]  11/18/2011, 9:39 AM    ________________________________________________________________________  [facility] MD note:  I personally examined the patient, reviewed the data and agree with the assessment and plan described above.   [name], MD [facility] Pager [phone]

## 2011-11-19 ENCOUNTER — Encounter (HOSPITAL_COMMUNITY): Payer: Self-pay | Admitting: Gastroenterology

## 2011-11-19 LAB — HEMOGLOBIN AND HEMATOCRIT, BLOOD: Hemoglobin: 10.5 g/dL — ABNORMAL LOW (ref 12.0–15.0)

## 2011-11-19 LAB — CBC
MCH: 31.7 pg (ref 26.0–34.0)
MCV: 94.4 fL (ref 78.0–100.0)
Platelets: 257 10*3/uL (ref 150–400)
RBC: 3.03 MIL/uL — ABNORMAL LOW (ref 3.87–5.11)
RDW: 13.4 % (ref 11.5–15.5)
WBC: 5.7 10*3/uL (ref 4.0–10.5)

## 2011-11-19 NOTE — Progress Notes (Signed)
   Subjective: Colonoscopy yesterday, see results in chart.  I gave her 1 unit prbc's after the procedure based on the amount of red blood seen in colon.  RN reports one  Episode of blood clots per rectum early last night.  None since.  She feels well.     Objective: Vital signs in last 24 hours: Temp:  [97.5 F (36.4 C)-98.8 F (37.1 C)] 98.6 F (37 C) (02/01 0627) Pulse Rate:  [78-83] 83  (02/01 0627) Resp:  [11-35] 16  (02/01 0627) BP: (119-164)/(46-82) 139/68 mmHg (02/01 0627) SpO2:  [92 %-100 %] 97 % (02/01 0627) Weight:  [160 lb (72.576 kg)] 160 lb (72.576 kg) (01/31 1530) Last BM Date: 11/18/11 General: alert and oriented times 3 Heart: regular rate and rythm Abdomen: soft, non-tender, non-distended, normal bowel sounds  Lab Results:  Basename 11/19/11 0140 11/18/11 1755 11/18/11 0750 11/16/11 1830  WBC 5.7 -- -- 7.2  HGB 9.6* 10.7* 10.2* --  PLT 257 -- -- 343  MCV 94.4 -- -- 97.0   BMET  Basename 11/16/11 1830  NA 137  K 3.7  CL 98  CO2 28  GLUCOSE 123*  BUN 17  CREATININE 0.77  CALCIUM 9.6   LFT  Basename 11/16/11 1830  PROT 8.0  ALBUMIN 3.9  AST 23  ALT 19  ALKPHOS 99  BILITOT 0.4  BILIDIR --  IBILI --   PT/INR  Basename 11/16/11 1830  INR 1.02     Assessment/Plan: 78 y.o. female with GI bleeding, presumed diverticular  Seems like bleeding has slowed or stopped altogether.  Will obvserve another 24 hours for clear rebleeding.  HL IV and start solid foods.  H/h at 5pm and then cbc in AM.    [name], MD  11/19/2011, 7:23 AM [facility] Pager [phone]

## 2011-11-19 NOTE — Progress Notes (Signed)
Another episode of bleeding, approximately 40 cc, with several large clots visible.  PA notified.

## 2011-11-19 NOTE — Plan of Care (Signed)
Problem: Consults Goal: GI Bleeding Patient Education See Patient Education Module for education specifics.  Outcome: Completed/Met Date Met:  11/19/11 Handouts on GI bleeding, diverticulitis.

## 2011-11-19 NOTE — Clinical Documentation Improvement (Signed)
Anemia Documentation Clarification Query  THIS DOCUMENT IS NOT A PERMANENT PART OF THE MEDICAL RECORD  RESPOND TO THE THIS QUERY, FOLLOW THE INSTRUCTIONS BELOW:  1. If needed, update documentation for the patient's encounter via the notes activity.  2. Access this query again and click edit on the In [employer].  3. After updating, or not, click F2 to complete all highlighted (required) fields concerning your review. Select "additional documentation in the medical record" OR "no additional documentation provided".  4. Click Sign note button.  5. The deficiency will fall out of your In Basket *Please let us know if you are not able to complete this workflow by phone or e-mail (listed below).          11/19/11  Dear Dr. Kennedy Bucker and/or Associates  In an effort to better capture your patient's severity of illness, reflect appropriate length of stay and utilization of resources, a review of the patient medical record has revealed the following indicators.    Based on your clinical judgment, please clarify and document in a progress note and/or discharge summary the clinical condition associated with the following supporting information:  In responding to this query please exercise your independent judgment.  The fact that a query is asked, does not imply that any particular answer is desired or expected.  11/18/11 Progr note..." Anemia- secondary to blood loss..." For Accurate DX specificity & severity can noted "Anemia- secondary to blood loss" be further clarifed with type of "blood loss". Thank you  Possible Clinical Conditions: Iron deficiency anemia Pernicious anemia  Acute Blood Loss Anemia  Chronic blood loss anemia  Anemia due to chronic disease Anemia due to malignancy  Aplastic anemia Precipitous drop in Hematocrit Sickle cell anemia  Other Condition (please specify)  Cannot Clinically Determine  Supporting Information: Signs and Symptoms: 11/18/11 progr note..."  78 yo female with presumed diverticular bleed, continued slow bleed..."  Diagnostics: per 11/19/11 Prog Note: 11/19/11 0140         11/18/11 1755        11/18/11 0750  HGB    9.6*                    10.7*                         10.2*   Fe studies:  Treatments: 11/18/11 OP note..."Will transfuse one unit blood overnight..." 11/18/11: EGD & Colonoscopy  Serial H&H monitoring: 11/19/11 Repeat H/H  You may use possible, probable, or suspect with inpatient documentation. Possible, probable, suspected diagnoses MUST be documented at the time of discharge.   Thank You,  Toribio Harbour, RN, BSN, CCDS Certified Clinical Documentation Specialist Pager: [phone]  Health Information Management [place]

## 2011-11-19 NOTE — Progress Notes (Signed)
Patient has had two additional bouts of blood per rectum this afternoon, roughly 100 cc's each, less clots.  Will continue to monitor.  Roney Mans. RN, BSN 11/19/2011 6:17 PM

## 2011-11-19 NOTE — Progress Notes (Addendum)
Daughter called this nurse to the room to report "lots of blood everywhere" when the patient sat on the commode.  Tech had already flushed, but reported "about 200 cc's" of blood and clots.  PA notified, new orders received.  Will continue to monitor.  Other than the blood, patient tolerated a solid breakfast well with no complaints of pain/nausea/discomfort.

## 2011-11-20 LAB — CBC
MCH: 31.8 pg (ref 26.0–34.0)
MCV: 94.8 fL (ref 78.0–100.0)
Platelets: 255 10*3/uL (ref 150–400)
RBC: 2.86 MIL/uL — ABNORMAL LOW (ref 3.87–5.11)
RDW: 13.3 % (ref 11.5–15.5)
WBC: 5.9 10*3/uL (ref 4.0–10.5)

## 2011-11-20 LAB — TYPE AND SCREEN
ABO/RH(D): A POS
Antibody Screen: NEGATIVE
Unit division: 0

## 2011-11-20 LAB — HEMOGLOBIN AND HEMATOCRIT, BLOOD
HCT: 27.6 % — ABNORMAL LOW (ref 36.0–46.0)
Hemoglobin: 9.7 g/dL — ABNORMAL LOW (ref 12.0–15.0)

## 2011-11-20 NOTE — Progress Notes (Signed)
Patient ID: Susan Henry, female   DOB: December 21, 1924, 78 y.o.   MRN: [id number] [facility] Progress Note  Subjective: Anxious but otherwise feels fine-family in room. She had 2-3 small BM's yesterday afternoon- bloody with clots but none since. Denies any SOb,dizziness etc.  Objective:  Vital signs in last 24 hours: Temp:  [97.5 F (36.4 C)-98.5 F (36.9 C)] 97.5 F (36.4 C) (02/02 0500) Pulse Rate:  [69-84] 69  (02/02 0500) Resp:  [16] 16  (02/02 0500) BP: (120-131)/(60-68) 131/60 mmHg (02/02 0500) SpO2:  [98 %-99 %] 99 % (02/02 0500) Last BM Date: 11/19/11 General:   Alert,  Well-developed,    in NAD Heart:  Regular rate and rhythm; no murmurs Pulm;clear Abdomen:  Soft, nontender and nondistended. Normal bowel sounds, without guarding,  Extremities:  Without edema. Neurologic:  Alert and  oriented x4;  grossly normal neurologically. Psych:  Alert and cooperative. Normal mood and affect.  Intake/Output from previous day: 02/01 0701 - 02/02 0700 In: 720 [P.O.:720] Out: -  Intake/Output this shift:    Lab Results:  Basename 11/20/11 0345 11/19/11 1356 11/19/11 0140  WBC 5.9 -- 5.7  HGB 9.1* 10.5* 9.6*  HCT 27.1* 30.1* 28.6*  PLT 255 -- 257   Assessment / Plan: #1  78 yo female with stuttering presumed diverticular bleeding-no bleeding past 15 hours- she had not been bleeding fast enough to warrant bleeding scan/arteriogram, and has only required 1 unit blood tx so far Continue observation another 24 hours-hopefully resolving. Active Problems:  GI hemorrhage  Diverticulosis of colon (without mention of hemorrhage)     LOS: 4 days   Susan Henry  11/20/2011, 8:45 AM    ________________________________________________________________________  [facility] MD note:  I personally examined the patient, reviewed the data and agree with the assessment and plan described above.  Had very small red bloody BM this AM.  Very slow, stuttering lower gi  bleeding.  Obviously frustrating. Fortunately she's only required 1 unit blood so far.  Will observe another 24 hours.   Susan Bunting, MD [facility] Pager [phone]

## 2011-11-21 LAB — HEMOGLOBIN AND HEMATOCRIT, BLOOD
HCT: 28 % — ABNORMAL LOW (ref 36.0–46.0)
Hemoglobin: 9.7 g/dL — ABNORMAL LOW (ref 12.0–15.0)
Hemoglobin: 9.6 g/dL — ABNORMAL LOW (ref 12.0–15.0)

## 2011-11-21 MED ORDER — PEG-KCL-NACL-NASULF-NA ASC-C 100 G PO SOLR
1.0000 | Freq: Once | ORAL | Status: AC
  Administered 2011-11-21: 100 g via ORAL
  Filled 2011-11-21: qty 1

## 2011-11-21 NOTE — Progress Notes (Signed)
Patient ID: Susan Henry, female   DOB: 1925/08/07, 78 y.o.   MRN: [id number] [facility] Progress Note  Subjective: Feels fine, had 2 bm's yesterday afternoon that were non bloody. Had one BM this am which was bloody...  Objective:  Vital signs in last 24 hours: Temp:  [97.8 F (36.6 C)-98.3 F (36.8 C)] 98.3 F (36.8 C) (02/03 0459) Pulse Rate:  [67-116] 67  (02/03 0459) Resp:  [18-20] 20  (02/03 0459) BP: (121-134)/(74-80) 131/76 mmHg (02/03 0459) SpO2:  [94 %-98 %] 94 % (02/03 0459) Last BM Date: 11/20/11 General:   Alert,  Well-developed,    in NAD Heart:  Regular rate and rhythm; no murmurs Pulm; Abdomen:  Soft, nontender and nondistended. Normal bowel sounds, without guarding, and without rebound.   Extremities:  Without edema. Neurologic:  Alert and  oriented x4;  grossly normal neurologically. Psych:  Alert and cooperative. Normal mood and affect.  Intake/Output from previous day: 02/02 0701 - 02/03 0700 In: 720 [P.O.:720] Out: -  Intake/Output this shift:    Lab Results:  Basename 11/20/11 2140 11/20/11 0935 11/20/11 0345 11/19/11 0140  WBC -- -- 5.9 5.7  HGB 9.4* 9.7* 9.1* --  HCT 27.6* 28.6* 27.1* --  PLT -- -- 255 257    Assessment / Plan: #1 78 yo female with prolonged slow presumed diverticular bleeding, so far one unit blood transfusion 2-3 days ago. ? Capsule to r/o distal ileal source.. Continue observation today, serial hgb's  #2 Anemia secondary to acute blood loss.     LOS: 5 days   [name]  11/21/2011, 8:44 AM    ________________________________________________________________________  [facility] MD note:  I personally examined the patient, reviewed the data and agree with the assessment and plan described above.  Colonoscopy 3 days ago showed diverticulosis, red blood throughout colon and some red in TI. EGD was normal.  Still minor, sputtering red rectal bleeding but has only needed ONE unit blood in the 4-5 days  she's been here. Too slow a bleed to pick up on nuc scan or angio.  I am going to prep her with moviprep today, tomorrow AM and then will discuss her case with my partners at sign out rounds tomorrow am (SB capsule study vs. Repeat colonoscopy).    She had surgery remotely ("LAR for complicated diverticulitis" per previous h and p) but I cannot find op report in E chart or other formal mentions of this. Pt and family not sure about details of surgery.  I did not see a left sided anastomosis on colonscopy last week, but there was a lot of red blood and so it could have been missed.   [name], MD [facility] Pager [phone]

## 2011-11-22 LAB — HEMOGLOBIN AND HEMATOCRIT, BLOOD: HCT: 29.2 % — ABNORMAL LOW (ref 36.0–46.0)

## 2011-11-22 NOTE — Progress Notes (Addendum)
Mrs Susan Henry is alert and oriented. Pt continue to have bloody stools during the night. At 0315 this morning encouraged  pt began drinking the 2nd part of  the remaining portion of moviprep.  Pt verbalized understanding the reason for drinking the prep and also her NPO status post drinking the prep. Encouraged pt call the nurse whenever she is done drinking her prep. Will continue to monitor.

## 2011-11-22 NOTE — Progress Notes (Signed)
Patient seen and I agree with the above documentation, including the assessment and plan. Repeat colon prep last night, now with no further bleeding. Hgb has been stable. If active rebleeding today, will attempt repeat colonoscopy.  Other option is tagged RBC study and angio if positive, but bleeding has been low volume and stuttering. Will follow.

## 2011-11-22 NOTE — Progress Notes (Addendum)
Susan Henry is alert and oriented. Pt continue to have bloody stools during the night. At 0315 this morning encouraged  pt began drinking the 2nd part of  the remaining portion of moviprep.  Pt verbalized understanding the reason for drinking the prep and also her NPO status post drinking the prep. Encouraged pt call the nurse whenever she is done drinking her prep. Will continue to monitor. 

## 2011-11-22 NOTE — Progress Notes (Signed)
[facility] Progress Note  SUBJECTIVE: Awoken from sleep, thinks she may have had a small  Amount of blood in some of her bowel movements after bowel prep but not certain.  OBJECTIVE:  Vital signs in last 24 hours: Temp:  [97.5 F (36.4 C)-98.6 F (37 C)] 97.5 F (36.4 C) (02/04 0513) Pulse Rate:  [95-99] 95  (02/04 0513) Resp:  [16-20] 20  (02/04 0513) BP: (132-144)/(71-86) 144/71 mmHg (02/04 0513) SpO2:  [95 %-98 %] 95 % (02/04 0513) Last BM Date: 11/21/11 General:    white female in NAD Heart:  regular rate and rhythm; no murmurs Lungs: Respirations even and unlabored Abdomen:  Soft, nontender and nondistended. Normal bowel sounds. Extremities:  Without edema. Neurologic:  Still sleepy, oriented,  grossly normal neurologically. Psych:  Cooperative. Normal mood and affect.   Lab Results:  Basename 11/21/11 2051 11/21/11 0834 11/20/11 2140 11/20/11 0345  WBC -- -- -- 5.9  HGB 9.6* 9.7* 9.4* --  HCT 28.5* 28.0* 27.6* --  PLT -- -- -- 255    ASSESSMENT / PLAN:  1.  78 yo female with slow, albeit prolonged, lower GI bleed presumed to be diverticular. Prepped yesterday for possible colonoscopy today but bleeding seems to have stopped or at least slowed significantly. Unfortunately she flushed stool and it wasn't seen by staff but reportedly clear. She completed bowel prep with exception of one glass. Will continue to monitor her today, she is due for hemoglobin recheck in an hour.   Keep NPO for now until we can verify that stool is in fact clear. Asked RN to call and let me know if next BM contains blood.   2.  Anemia secondary to acute blood loss. Has required a unit of blood. Due for repeat CBC in a hour.   LOS: 6 days   Willette Cluster  11/22/2011, 8:28 AM

## 2011-11-22 NOTE — Progress Notes (Signed)
Patient was on clear liquid diet but around 1500 patient was getting upset and really wanted more food.  Per MD can advance diet to heart healthy. After dinner patient thought she needed to have a bowel movement but when she went it was just gas but this RN observed small amount of red blood with small clot but no stool.  Informed Dr. Rhea Belton who states just to monitor patient through the night and decisions will be made in the morning.

## 2011-11-23 LAB — HEMOGLOBIN AND HEMATOCRIT, BLOOD
HCT: 27.6 % — ABNORMAL LOW (ref 36.0–46.0)
Hemoglobin: 9.3 g/dL — ABNORMAL LOW (ref 12.0–15.0)

## 2011-11-23 MED ORDER — METOCLOPRAMIDE HCL 5 MG/ML IJ SOLN
10.0000 mg | Freq: Once | INTRAMUSCULAR | Status: AC
  Administered 2011-11-23: 10 mg via INTRAVENOUS
  Filled 2011-11-23: qty 2

## 2011-11-23 MED ORDER — PEG 3350-KCL-NA BICARB-NACL 420 G PO SOLR
4000.0000 mL | Freq: Once | ORAL | Status: AC
  Administered 2011-11-23: 4000 mL via ORAL
  Filled 2011-11-23: qty 4000

## 2011-11-23 NOTE — Progress Notes (Signed)
[facility]  SUBJECTIVE: feels okay but passed about of bloody stool with small clots early this am.   OBJECTIVE:  Vital signs in last 24 hours: Temp:  [97.6 F (36.4 C)-98.3 F (36.8 C)] 98.3 F (36.8 C) (02/05 0601) Pulse Rate:  [78-98] 78  (02/05 0601) Resp:  [20] 20  (02/05 0601) BP: (126-146)/(76-82) 146/82 mmHg (02/05 0601) SpO2:  [96 %-98 %] 98 % (02/05 0601) Last BM Date: 11/23/11 General: pleasant white female in NAD Heart:  Regular rate and rhythm; no murmurs Lungs: Respirations even and unlabored Abdomen:  Soft, nontender and nondistended. Normal bowel sounds. Extremities:  Without edema. Neurologic:  Alert and oriented,  grossly normal neurologically. Psych:  Cooperative. Normal mood and affect.   Lab Results:  Basename 11/23/11 0925 11/22/11 2051 11/22/11 0846  WBC -- -- --  HGB 9.4* 9.6* 9.8*  HCT 26.6* 28.2* 29.2*  PLT -- -- --    ASSESSMENT / PLAN:  1. 78 yo female with slow persistent lower GI bleed presumed to be diverticular.Patient really wanted to eat yesterday and after several hours of not bleeding she was given a regular diet. Unfortunately she had a bloody BM this am. According to nurse, blood did not look old. Patient put back on NPO. Will start another bowel prep (slow prep) and probably repeat colonoscopy since bleeding has persisted for a least 5 days now. Suppose it is possible that her bleeding this am could have been from internal hemorrhoids but no way to know for sure at this point.   2. Anemia of acute blood loss, she has only required one unit of blood thus far. Will continue to monitor and transfuse if necessary.    LOS: 7 days   Willette Cluster  11/23/2011, 11:36 AM

## 2011-11-23 NOTE — Progress Notes (Addendum)
Patient had another bowel movement per rectum, approximately 30 cc's frank, fresh blood.    Bowel movements occurring frequently with bowel prep as expected, mostly fresh-appearing blood, very little stool detected, some clots.  Will continue to monitor.

## 2011-11-23 NOTE — Progress Notes (Signed)
Pt. Called nurse into room.  Had just been in Bathroom, bowel movement consisted of approx. 250 ml liquid stool, mostly blood, with some flecks of what appeared to be small clots.  No new symptoms reported.  Lester Kinsman Lonia Mad, RN

## 2011-11-23 NOTE — Progress Notes (Signed)
I agree with the above documentation, including the assessment and plan. Low vol bleeding throughout the day. Will plan on repeat colonoscopy tomorrow Follow HCTs If high vol, tagged scan

## 2011-11-24 ENCOUNTER — Encounter (HOSPITAL_COMMUNITY)
Admission: AD | Disposition: A | Discharge status: Home / Self Care | Payer: Self-pay | Source: Ambulatory Visit | Attending: Internal Medicine

## 2011-11-24 ENCOUNTER — Encounter (HOSPITAL_COMMUNITY): Payer: Self-pay | Admitting: *Deleted

## 2011-11-24 HISTORY — PX: COLONOSCOPY: SHX5424

## 2011-11-24 LAB — TYPE AND SCREEN
Antibody Screen: NEGATIVE
Unit division: 0

## 2011-11-24 LAB — HEMOGLOBIN AND HEMATOCRIT, BLOOD
HCT: 27.6 % — ABNORMAL LOW (ref 36.0–46.0)
Hemoglobin: 9.1 g/dL — ABNORMAL LOW (ref 12.0–15.0)

## 2011-11-24 SURGERY — COLONOSCOPY
Anesthesia: Moderate Sedation

## 2011-11-24 MED ORDER — PANTOPRAZOLE SODIUM 40 MG IV SOLR
40.0000 mg | Freq: Once | INTRAVENOUS | Status: AC
  Administered 2011-11-24: 40 mg via INTRAVENOUS
  Filled 2011-11-24: qty 40

## 2011-11-24 MED ORDER — FENTANYL CITRATE 0.05 MG/ML IJ SOLN
INTRAMUSCULAR | Status: DC | PRN
  Administered 2011-11-24 (×2): 25 ug via INTRAVENOUS

## 2011-11-24 MED ORDER — MIDAZOLAM HCL 10 MG/2ML IJ SOLN
INTRAMUSCULAR | Status: AC
  Filled 2011-11-24: qty 2

## 2011-11-24 MED ORDER — SODIUM CHLORIDE 0.45 % IV SOLN: Freq: Once | INTRAVENOUS | Status: DC

## 2011-11-24 MED ORDER — FENTANYL CITRATE 0.05 MG/ML IJ SOLN
INTRAMUSCULAR | Status: AC
  Filled 2011-11-24: qty 2

## 2011-11-24 MED ORDER — FENTANYL NICU IV SYRINGE 50 MCG/ML: INJECTION | INTRAMUSCULAR | Status: DC | PRN

## 2011-11-24 MED ORDER — MIDAZOLAM HCL 5 MG/5ML IJ SOLN
INTRAMUSCULAR | Status: DC | PRN
  Administered 2011-11-24 (×2): 2 mg via INTRAVENOUS

## 2011-11-24 NOTE — Op Note (Signed)
[facility] [address] [place], [place]  [address]  COLONOSCOPY PROCEDURE REPORT  PATIENT:  Susan Henry, Susan Henry  MR#:  [id number] BIRTHDATE:  1924/11/16, 86 yrs. old  GENDER:  female ENDOSCOPIST:  Carie Caddy. Pyrtle, MD REF. BY:  [facility], PROCEDURE DATE:  11/24/2011 PROCEDURE:  Diagnostic Colonoscopy ASA CLASS:  Class II INDICATIONS:  rectal bleeding MEDICATIONS:   Fentanyl 50 mcg IV, Versed 4 mg IV  DESCRIPTION OF PROCEDURE:   After the risks benefits and alternatives of the procedure were thoroughly explained, informed consent was obtained.  Digital rectal exam was performed and revealed no rectal masses.   The  endoscope was introduced through the anus and advanced to the ileum, without limitations.  The quality of the prep was good, using MoviPrep.  The instrument was then slowly withdrawn as the colon was fully examined. <<PROCEDUREIMAGES>>  FINDINGS:  A small diverticulum was found terminal ileum  This was otherwise a normal examination of the terminal ileum (about 30 cm examined)  Severe diverticulosis was found ascending colon to sigmoid colon with no evidence of active or recent bleeding. There were a few very small flecks of heme in the sigmoid.  Copious lavage and irrigation performed and no single culprit diverticulum identified.  Small internal hemorrhoids were found.   Retroflexed views in the rectum revealed no other findings other than those already described. The scope was then withdrawn from the cecum and the procedure completed.  COMPLICATIONS:  None ENDOSCOPIC IMPRESSION: 1) Diverticulum in the terminal ileum 2) Otherwise normal examination in the terminal ileum 3) Severe diverticulosis ascending colon to sigmoid colon. No active bleeding. 4) Small internal hemorrhoids  RECOMMENDATIONS: 1) Advance diet 2) Repeat CBC in the am 3) If counts stable and no further bleeding, consider discharge.   Carie Caddy. Rhea Belton, MD  CC:  Stan Head,  MD The Patient  n. eSIGNED:   Carie Caddy. Pyrtle at 11/24/2011 12:27 PM  Santiago Bumpers, [phone]

## 2011-11-24 NOTE — Interval H&P Note (Signed)
History and Physical Interval Note:  11/24/2011 11:34 AM  Susan Henry  has presented today for surgery, with the diagnosis of hematochezia.  The various methods of treatment have been discussed with the patient and family. After consideration of risks, benefits and other options for treatment, the patient has consented to  Procedure(s): COLONOSCOPY as a surgical intervention .  The patients' history has been reviewed, patient examined, no change in status, stable for surgery.  I have reviewed the patients' chart and labs.  Questions were answered to the patient's satisfaction.     Shanetta Nicolls M

## 2011-11-25 ENCOUNTER — Other Ambulatory Visit: Payer: Self-pay

## 2011-11-25 ENCOUNTER — Encounter (HOSPITAL_COMMUNITY): Payer: Self-pay | Admitting: Internal Medicine

## 2011-11-25 DIAGNOSIS — D509 Iron deficiency anemia, unspecified: Secondary | ICD-10-CM

## 2011-11-25 LAB — HEMOGLOBIN AND HEMATOCRIT, BLOOD: HCT: 27.3 % — ABNORMAL LOW (ref 36.0–46.0)

## 2011-11-25 LAB — GLUCOSE, CAPILLARY: Glucose-Capillary: 114 mg/dL — ABNORMAL HIGH (ref 70–99)

## 2011-11-25 MED ORDER — INTEGRA PLUS PO CAPS
1.0000 | ORAL_CAPSULE | ORAL | Status: DC
Start: 2011-11-25 — End: 2011-11-25

## 2011-11-25 MED ORDER — INTEGRA PLUS PO CAPS: 1.0000 | ORAL_CAPSULE | ORAL | Status: DC

## 2011-11-25 NOTE — Progress Notes (Signed)
[facility] Progress Note  SUBJECTIVE: feels okay, ready to go home. No further bleeding  OBJECTIVE:  Vital signs in last 24 hours: Temp:  [97.6 F (36.4 C)-99.7 F (37.6 C)] 98.4 F (36.9 C) (02/07 0634) Pulse Rate:  [69-96] 70  (02/07 0634) Resp:  [5-20] 16  (02/07 0634) BP: (115-171)/(64-91) 121/68 mmHg (02/07 0634) SpO2:  [92 %-100 %] 97 % (02/07 0634) Last BM Date: 11/23/11 General:    white female in NAD Heart:  Regular rate and rhythm Lungs: Respirations even and unlabored, lungs CTA bilaterally Abdomen:  Soft, nontender and nondistended. Normal bowel sounds. Extremities:  Without edema. Neurologic:  Alert and oriented,  grossly normal neurologically. Psych:  Cooperative. Normal mood and affect.  Lab Results:  Basename 11/25/11 0829 11/24/11 2055 11/24/11 0926  WBC -- -- --  HGB 9.3* 9.1* 9.2*  HCT 27.3* 27.1* 27.6*  PLT -- -- --    ASSESSMENT / PLAN:  1. 78 yo female with slow, but prolonged lower GI bleed, presumed  diverticular. Bleeding resolved. Repeat colonoscopy yesterday showed only small diverticulum in terminal ileum and severe diverticulosis involving ascending colon to sigmoid. No blood seen in colon.  2. Anemia of acute blood loss, she received one unit of blood this admission and that was several days ago . Hemoglobin has remained about 9.1 since. Though stable, hemoglobin about 3gms lower than when admitted. Will start her on daily iron, she will pick up sample at office. Cautioned potential for constipation with iron. She will take stool softener as needed. To guide iron therapy will make arrangements for repeat cbc in a couple of months     LOS: 9 days   Willette Cluster  11/25/2011, 11:12 AM

## 2011-11-25 NOTE — Progress Notes (Signed)
I agree with the above documentation, including the assessment and plan. No further bleeding.  Will d/c home. Will stop by our office to pick up oral iron supplement. Followup with Dr. Leone Payor

## 2011-12-06 NOTE — Discharge Summary (Signed)
Gastroenterology Discharge Summary  Name: Susan Henry MRN: [id number] DOB: 10/10/1925 78 y.o. PCP:  [name], MD, MD  Date of Admission: 11/16/2011  4:51 PM Date of Discharge: 11/25/2011 Discharging MD:  Erick Blinks, MD Primary GI:  Stan Head, MD  Discharge Diagnosis: 1.Diverticular Bleed, prolonged 2.Anemia of acute blood loss. Despite prolonged bleeding patient's hemoglobin never fell beyond 9.1, even before the one unit of blood she required this admission.  3. HTN  Consultations:  none  GI Procedures Performed: Colonoscopy 11/18/11  (Dr. Christella Hartigan) - refer to HPI  Colonoscopy 11/24/11 (Dr. Rhea Belton).- refer to HPI  History/Physical Exam:  See Admission H&P  Admission HPI:  Done by Stan Head, MD  Elderly white woman with known hemorrhoids. She had a colonoscopy in 01/2011. She developed rectal bleeding this AM. Bright red blood on the toilet paper. Daughter is here and says that the patient's that she saw blood in the stool in the commode.Mild abdominal cramps. Denies constipation or bowel habit changes. No heavy lifting. Denies other episodes of bleeding, just wants this morning. She was last seen in the spring of 2012. She had some rectal bleeding that I thought was from hemorrhoids. She underwent a colonoscopy because she had a low ferritin, and a hemoglobin of 10. It demonstrated hemorrhoids and diverticulosis. I recommended an upper endoscopy for completeness but she did not follow through with that. I do not know what happened with her anemia since then. She has since stopped taking iron. She denies lightheadedness, chest pain or significant dyspnea. She is somewhat anxious over the bleeding.   Hospital Course by problem list:  1. Diverticular Bleed -. Once admitted patient continued to bleed intermittently and after a couple of days it was decided that colonoscopy would be pursued. Colonoscopy by Dr. Christella Hartigan revealed scattered diverticula and red blood  throughout the colon and also in the terminal ileum. Following the procedure, patient was given one unit of blood based on the amount of blood seen in the colon. Her hemoglobin at that time was around 10.5 but that was down 2 g from admission. Exact source of bleeding was not found during the colonoscopy. Over the following couple of days patient continued to bleed intermittently though not rapidly enough to be detected by red blood cell scan or angiogram. Persistent bleeding patient ultimately underwent a second colonoscopy, this time with Dr. Rhea Belton. Findings included a small diverticulum in the terminal ileum, severe diverticulosis ascending colon the sigmoid colon without active bleeding or recent bleeding.. Patient was released home 11/25/11 after having been 24 hours out from any further bleeding..  2. Anemia of acute blood loss - Her hemoglobin fell approximately 2 grams within first 48-72 hours. Based on 2 gm drop and amount of blood seen in colon during colonoscopy, patient was transfused one unit of PRBC. This admission her hemoglobin never fell below 9.1, even prior to transfusion.   3. HTN, stable throughout this admission  Discharge Vitals:  BP 126/77  Pulse 72  Temp(Src) 97.6 F (36.4 C) (Oral)  Resp 16  Ht 5\' 5"  (1.651 m)  Wt 160 lb (72.576 kg)  BMI 26.63 kg/m2  SpO2 98%  Discharge Labs: No results found for this or any previous visit (from the past 24 hour(s)).  Disposition and follow-up:   Susan Henry was discharged from [facility] on 11/25/11 in stable condition.    Follow-up Appointments: Patient to have CBC in 6-8 weeks. Recommendations about whether to continue oral iron would be  made based on results.  Discharge Medications: Medication List  As of 12/06/2011  2:33 PM   STOP taking these medications         naproxen sodium 220 MG tablet         TAKE these medications         ACIPHEX 20 MG tablet   Generic drug: RABEprazole   Take 20 mg by mouth  daily.      bisoprolol 5 MG tablet   Commonly known as: ZEBETA   Take 5 mg by mouth daily.      CALCIUM 600 PO   Take 1 tablet by mouth daily.      CELEXA 20 MG tablet   Generic drug: citalopram   Take 20 mg by mouth daily.      ergocalciferol 50000 UNITS capsule   Commonly known as: VITAMIN D2   Take 50,000 Units by mouth once a week.      INTEGRA PLUS Caps   Take 1 capsule by mouth 1 day or 1 dose.      LASIX 20 MG tablet   Generic drug: furosemide   Take 20 mg by mouth daily.      lisinopril 20 MG tablet   Commonly known as: PRINIVIL,ZESTRIL   Take 20 mg by mouth daily.      NORVASC 5 MG tablet   Generic drug: amLODipine   Take 5 mg by mouth daily.            Signed: Willette Cluster 12/06/2011, 2:33 PM

## 2011-12-07 ENCOUNTER — Telehealth: Payer: Self-pay | Admitting: *Deleted

## 2011-12-07 DIAGNOSIS — D649 Anemia, unspecified: Secondary | ICD-10-CM

## 2011-12-07 NOTE — Telephone Encounter (Signed)
Susan Henry, please make sure patient has a CBC scheduled for about 6 weeks because we will need to tell her whether to continue iron or not. Thanks               Patient has a CBC in for 01/24/11 already. Note to remind patient.

## 2012-01-24 ENCOUNTER — Telehealth: Payer: Self-pay | Admitting: *Deleted

## 2012-01-24 NOTE — Telephone Encounter (Signed)
Message copied by Daphine Deutscher on Mon Jan 24, 2012  2:13 PM ------      Message from: Daphine Deutscher      Created: Tue Dec 07, 2011  8:38 AM       Call and remind patient CBC due on 01/24/12 for PG

## 2012-01-24 NOTE — Telephone Encounter (Signed)
Spoke with patient and she will come this week for labs.

## 2012-01-26 ENCOUNTER — Other Ambulatory Visit (INDEPENDENT_AMBULATORY_CARE_PROVIDER_SITE_OTHER): Payer: Medicare Other

## 2012-01-26 DIAGNOSIS — D509 Iron deficiency anemia, unspecified: Secondary | ICD-10-CM

## 2012-01-26 LAB — CBC WITH DIFFERENTIAL/PLATELET
Basophils Absolute: 0.1 10*3/uL (ref 0.0–0.1)
Eosinophils Absolute: 0.1 10*3/uL (ref 0.0–0.7)
Lymphocytes Relative: 32.3 % (ref 12.0–46.0)
MCHC: 32.5 g/dL (ref 30.0–36.0)
MCV: 90.8 fl (ref 78.0–100.0)
Monocytes Absolute: 0.4 10*3/uL (ref 0.1–1.0)
Neutro Abs: 3.7 10*3/uL (ref 1.4–7.7)
Neutrophils Relative %: 57.6 % (ref 43.0–77.0)
RDW: 14.7 % — ABNORMAL HIGH (ref 11.5–14.6)

## 2012-07-29 ENCOUNTER — Inpatient Hospital Stay (HOSPITAL_COMMUNITY)
Admission: EM | Admit: 2012-07-29 | Discharge: 2012-08-02 | DRG: 378 | Disposition: A | Discharge status: Home / Self Care | Payer: Medicare Other | Attending: Internal Medicine | Admitting: Internal Medicine

## 2012-07-29 ENCOUNTER — Telehealth: Payer: Self-pay | Admitting: Gastroenterology

## 2012-07-29 DIAGNOSIS — Z860100 Personal history of colon polyps, unspecified: Secondary | ICD-10-CM

## 2012-07-29 DIAGNOSIS — K922 Gastrointestinal hemorrhage, unspecified: Secondary | ICD-10-CM | POA: Diagnosis present

## 2012-07-29 DIAGNOSIS — F3289 Other specified depressive episodes: Secondary | ICD-10-CM | POA: Diagnosis present

## 2012-07-29 DIAGNOSIS — D62 Acute posthemorrhagic anemia: Secondary | ICD-10-CM | POA: Diagnosis present

## 2012-07-29 DIAGNOSIS — K648 Other hemorrhoids: Secondary | ICD-10-CM

## 2012-07-29 DIAGNOSIS — I9589 Other hypotension: Secondary | ICD-10-CM | POA: Diagnosis not present

## 2012-07-29 DIAGNOSIS — F329 Major depressive disorder, single episode, unspecified: Secondary | ICD-10-CM | POA: Diagnosis present

## 2012-07-29 DIAGNOSIS — K5731 Diverticulosis of large intestine without perforation or abscess with bleeding: Principal | ICD-10-CM | POA: Diagnosis present

## 2012-07-29 DIAGNOSIS — K219 Gastro-esophageal reflux disease without esophagitis: Secondary | ICD-10-CM | POA: Diagnosis present

## 2012-07-29 DIAGNOSIS — K5730 Diverticulosis of large intestine without perforation or abscess without bleeding: Secondary | ICD-10-CM | POA: Diagnosis present

## 2012-07-29 DIAGNOSIS — F411 Generalized anxiety disorder: Secondary | ICD-10-CM | POA: Diagnosis present

## 2012-07-29 DIAGNOSIS — D509 Iron deficiency anemia, unspecified: Secondary | ICD-10-CM | POA: Diagnosis present

## 2012-07-29 DIAGNOSIS — G4700 Insomnia, unspecified: Secondary | ICD-10-CM | POA: Diagnosis present

## 2012-07-29 DIAGNOSIS — D649 Anemia, unspecified: Secondary | ICD-10-CM

## 2012-07-29 DIAGNOSIS — I10 Essential (primary) hypertension: Secondary | ICD-10-CM | POA: Diagnosis present

## 2012-07-29 DIAGNOSIS — I959 Hypotension, unspecified: Secondary | ICD-10-CM

## 2012-07-29 LAB — CBC WITH DIFFERENTIAL/PLATELET
Basophils Relative: 0 % (ref 0–1)
Eosinophils Absolute: 0.1 10*3/uL (ref 0.0–0.7)
Hemoglobin: 9.5 g/dL — ABNORMAL LOW (ref 12.0–15.0)
MCH: 31.5 pg (ref 26.0–34.0)
MCHC: 33 g/dL (ref 30.0–36.0)
Monocytes Relative: 5 % (ref 3–12)
Neutro Abs: 10.3 10*3/uL — ABNORMAL HIGH (ref 1.7–7.7)
Neutrophils Relative %: 84 % — ABNORMAL HIGH (ref 43–77)
Platelets: 294 10*3/uL (ref 150–400)
RBC: 3.02 MIL/uL — ABNORMAL LOW (ref 3.87–5.11)

## 2012-07-29 MED ORDER — SODIUM CHLORIDE 0.9 % IV SOLN
1000.0000 mL | INTRAVENOUS | Status: DC
  Administered 2012-07-30: 1000 mL via INTRAVENOUS

## 2012-07-29 MED ORDER — ONDANSETRON HCL 4 MG/2ML IJ SOLN
4.0000 mg | Freq: Four times a day (QID) | INTRAMUSCULAR | Status: DC
  Administered 2012-07-30: 4 mg via INTRAVENOUS
  Filled 2012-07-29: qty 2

## 2012-07-29 MED ORDER — SODIUM CHLORIDE 0.9 % IV SOLN
1000.0000 mL | Freq: Once | INTRAVENOUS | Status: AC
  Administered 2012-07-30: 1000 mL via INTRAVENOUS

## 2012-07-29 NOTE — Telephone Encounter (Signed)
On call note @ 2030. Pts husband reports she has had 2 episodes of diarrhea with bleeding that started about 30 min ago. No other symptoms. Had hospitalization with a diverticular bleed earlier this year. Advised to stay on clear liquids and rest at home tonight. If bleeding recurs or she has ongoing diarrhea go to nearest ED for evaluation. Pt husband understands and agrees with the plan.

## 2012-07-29 NOTE — ED Notes (Signed)
Per EMS, the patient advised that she has bright red blood with large clots in her stool.  She vomited one time in the presence of EMS, but no blood was noted in her emesis.  The patient has a hx of diverticulitis and GI bleed, however her prior bleed wasn't this bad.

## 2012-07-30 ENCOUNTER — Inpatient Hospital Stay (HOSPITAL_COMMUNITY): Payer: Medicare Other

## 2012-07-30 ENCOUNTER — Encounter (HOSPITAL_COMMUNITY): Payer: Self-pay | Admitting: Family Medicine

## 2012-07-30 DIAGNOSIS — K922 Gastrointestinal hemorrhage, unspecified: Secondary | ICD-10-CM

## 2012-07-30 LAB — COMPREHENSIVE METABOLIC PANEL
ALT: 14 U/L (ref 0–35)
AST: 17 U/L (ref 0–37)
Albumin: 2.9 g/dL — ABNORMAL LOW (ref 3.5–5.2)
Alkaline Phosphatase: 96 U/L (ref 39–117)
BUN: 15 mg/dL (ref 6–23)
Chloride: 100 mEq/L (ref 96–112)
Potassium: 3.6 mEq/L (ref 3.5–5.1)
Sodium: 134 mEq/L — ABNORMAL LOW (ref 135–145)
Total Bilirubin: 0.3 mg/dL (ref 0.3–1.2)
Total Protein: 6.1 g/dL (ref 6.0–8.3)

## 2012-07-30 MED ORDER — TECHNETIUM TC 99M-LABELED RED BLOOD CELLS IV KIT
22.0000 | PACK | Freq: Once | INTRAVENOUS | Status: AC | PRN
  Administered 2012-07-30: 22 via INTRAVENOUS

## 2012-07-30 MED ORDER — HYDROCODONE-ACETAMINOPHEN 5-325 MG PO TABS: 1.0000 | ORAL_TABLET | ORAL | Status: DC | PRN

## 2012-07-30 MED ORDER — SODIUM CHLORIDE 0.9 % IV BOLUS (SEPSIS)
1000.0000 mL | Freq: Once | INTRAVENOUS | Status: AC
  Administered 2012-07-30: 1000 mL via INTRAVENOUS

## 2012-07-30 MED ORDER — BISOPROLOL FUMARATE 5 MG PO TABS
5.0000 mg | ORAL_TABLET | Freq: Every day | ORAL | Status: DC
  Filled 2012-07-30: qty 1

## 2012-07-30 MED ORDER — ACETAMINOPHEN 325 MG PO TABS
650.0000 mg | ORAL_TABLET | Freq: Four times a day (QID) | ORAL | Status: DC | PRN
  Administered 2012-08-01: 650 mg via ORAL
  Filled 2012-07-30: qty 2

## 2012-07-30 MED ORDER — WHITE PETROLATUM GEL
Status: AC
  Administered 2012-07-30: 06:00:00
  Filled 2012-07-30: qty 5

## 2012-07-30 MED ORDER — POTASSIUM CHLORIDE IN NACL 20-0.9 MEQ/L-% IV SOLN
INTRAVENOUS | Status: DC
  Administered 2012-07-30 – 2012-08-01 (×2): via INTRAVENOUS
  Filled 2012-07-30 (×6): qty 1000

## 2012-07-30 MED ORDER — AMLODIPINE BESYLATE 5 MG PO TABS
5.0000 mg | ORAL_TABLET | Freq: Every day | ORAL | Status: DC
  Filled 2012-07-30: qty 1

## 2012-07-30 MED ORDER — LISINOPRIL 20 MG PO TABS
20.0000 mg | ORAL_TABLET | Freq: Every day | ORAL | Status: DC
  Filled 2012-07-30: qty 1

## 2012-07-30 MED ORDER — ONDANSETRON HCL 4 MG PO TABS: 4.0000 mg | ORAL_TABLET | Freq: Four times a day (QID) | ORAL | Status: DC | PRN

## 2012-07-30 MED ORDER — ALUM & MAG HYDROXIDE-SIMETH 200-200-20 MG/5ML PO SUSP: 30.0000 mL | Freq: Four times a day (QID) | ORAL | Status: DC | PRN

## 2012-07-30 MED ORDER — ZOLPIDEM TARTRATE 5 MG PO TABS
5.0000 mg | ORAL_TABLET | Freq: Every evening | ORAL | Status: DC | PRN
  Administered 2012-07-31 – 2012-08-01 (×2): 5 mg via ORAL
  Filled 2012-07-30 (×2): qty 1

## 2012-07-30 MED ORDER — ONDANSETRON HCL 4 MG/2ML IJ SOLN: 4.0000 mg | Freq: Four times a day (QID) | INTRAMUSCULAR | Status: DC | PRN

## 2012-07-30 MED ORDER — ACETAMINOPHEN 650 MG RE SUPP: 650.0000 mg | Freq: Four times a day (QID) | RECTAL | Status: DC | PRN

## 2012-07-30 MED ORDER — PANTOPRAZOLE SODIUM 40 MG PO TBEC
40.0000 mg | DELAYED_RELEASE_TABLET | Freq: Every day | ORAL | Status: DC
  Administered 2012-07-30 – 2012-08-01 (×3): 40 mg via ORAL
  Filled 2012-07-30 (×4): qty 1

## 2012-07-30 NOTE — Progress Notes (Signed)
Pt from 6N. VSS. Oriented to room. Call bell within reach. No c/o pain. Order to transfuse blood. Consent obtained. Will monitor pt.

## 2012-07-30 NOTE — Progress Notes (Addendum)
Pt chart reviewed and talked to [place]. She is getting 3rd unit of PRBCs. Last hour, she has frequent bloody clots in stool. HR elevated. BP stable. Dr. Russella Dar has ordered nuclear tagged RBC scan today. Nurse will call radiology to find out about it.   302 pm talked to surgeon and he will come and see the patient.   428 pm called by nurse whitney about BP 97/53. Advised her to start 4th unit of PRBCs and 1000 cc IV NS bolus at this time. Pt is stable on visit and does not have much symptoms except feeling tired. Updated her condition to her daughter. Plan for nuclear scan noted and will be done later today. I also consider calling CCM to help with management given her ongoing low BP and GI bleed.

## 2012-07-30 NOTE — H&P (Signed)
PCP:   [name], MD   Chief Complaint:  Low GI bleed  HPI: This is a 78 year old female with known history of diverticulosis, this evening at approximately 9 PM she had a proximal mid to 4 episodes of bright red blood per rectum. She had no abdominal pain today, however, she did have abdominal pain  2 days and ago. She also had some nausea and vomiting which has since resolved. The family called Dr. Russella Dar who said that if it recurred to come to the ER. The bright red blood per rectum did recur and she came to ER.  Review of Systems:  The patient denies anorexia, fever, weight loss,, vision loss, decreased hearing, hoarseness, chest pain, syncope, dyspnea on exertion, peripheral edema, balance deficits, hemoptysis, severe indigestion/heartburn, hematuria, incontinence, genital sores, muscle weakness, suspicious skin lesions, transient blindness, difficulty walking, depression, unusual weight change, abnormal bleeding, enlarged lymph nodes, angioedema, and breast masses.  Past Medical History: Past Medical History  Diagnosis Date  . Hypertension   . Reflux   . Aortic sclerosis   . Insomnia   . Colon polyps     Hx of  adenomatous  . Diverticulosis   . Hemorrhoids, internal, with bleeding 01/08/2011  . Arthritis   . GERD (gastroesophageal reflux disease)   . Anemia     Iron deficiency   . Allergy     Seasonal  . Anxiety   . Depression   . Pneumonia   . Blood transfusion    Past Surgical History  Procedure Date  . Cholecystectomy   . Upper gastrointestinal endoscopy   . Low anterior bowel resection 1996    diverticulitis  . Colonoscopy 01/2011  . Colonoscopy 11/18/2011    Procedure: COLONOSCOPY;  Surgeon: [name], MD;  Location: [facility];  Service: Endoscopy;  Laterality: N/A;  . Esophagogastroduodenoscopy 11/18/2011    Procedure: ESOPHAGOGASTRODUODENOSCOPY (EGD);  Surgeon: [name], MD;  Location: [facility];  Service: Endoscopy;  Laterality: N/A;  .  Colonoscopy 11/24/2011    Procedure: COLONOSCOPY;  Surgeon: Erick Blinks, MD;  Location: [facility];  Service: Gastroenterology;  Laterality: N/A;    Medications: Prior to Admission medications   Medication Sig Start Date End Date Taking? Authorizing Provider  amLODipine (NORVASC) 5 MG tablet Take 5 mg by mouth daily.     Yes Historical Provider, MD  bisoprolol (ZEBETA) 5 MG tablet Take 5 mg by mouth daily.    Yes Historical Provider, MD  Calcium Carbonate (CALCIUM 600 PO) Take 1 tablet by mouth daily.     Yes Historical Provider, MD  furosemide (LASIX) 20 MG tablet Take 20 mg by mouth daily.     Yes Historical Provider, MD  lisinopril (PRINIVIL,ZESTRIL) 20 MG tablet Take 20 mg by mouth daily.     Yes Historical Provider, MD  RABEprazole (ACIPHEX) 20 MG tablet Take 20 mg by mouth daily.     Yes Historical Provider, MD  vitamin C (ASCORBIC ACID) 500 MG tablet Take 500 mg by mouth daily.   Yes Historical Provider, MD    Allergies:   Allergies  Allergen Reactions  . Alprazolam Rash    Social History:  reports that she has never smoked. She has never used smokeless tobacco. She reports that she drinks about 3.5 ounces of alcohol per week. She reports that she does not use illicit drugs.  Family History: Family History  Problem Relation Age of Onset  . Colon cancer Neg Hx   . Anesthesia problems Neg Hx   .  Hypotension Neg Hx   . Malignant hyperthermia Neg Hx   . Pseudochol deficiency Neg Hx   . Pancreatic cancer Father     Physical Exam: Filed Vitals:   07/29/12 2301 07/29/12 2303 07/29/12 2305  BP: 105/58  105/58  Pulse: 75  83  Temp: 97.7 F (36.5 C)  97.9 F (36.6 C)  TempSrc: Oral    Resp: 29  17  Height:   5\' 7"  (1.702 m)  Weight:   65.772 kg (145 lb)  SpO2: 92% 100% 98%    General:  Alert and oriented times three, well developed and nourished, no acute distress Eyes: PERRLA, pink conjunctiva, no scleral icterus ENT: Moist oral mucosa, neck supple, no  thyromegaly Lungs: clear to ascultation, no wheeze, no crackles, no use of accessory muscles Cardiovascular: regular rate and rhythm, no regurgitation, no gallops, no murmurs. No carotid bruits, no JVD Abdomen: soft, positive BS, non-tender, non-distended, no organomegaly, not an acute abdomen GU: not examined Neuro: CN II - XII grossly intact, sensation intact Musculoskeletal: strength 5/5 all extremities, no clubbing, cyanosis or edema Skin: no rash, no subcutaneous crepitation, no decubitus Psych: appropriate patient   Labs on Admission:   Basename 07/29/12 2321  NA 134*  K 3.6  CL 100  CO2 23  GLUCOSE 147*  BUN 15  CREATININE 0.85  CALCIUM 8.5  MG --  PHOS --    Basename 07/29/12 2321  AST 17  ALT 14  ALKPHOS 96  BILITOT 0.3  PROT 6.1  ALBUMIN 2.9*   No results found for this basename: LIPASE:2,AMYLASE:2 in the last 72 hours  Basename 07/29/12 2321  WBC 12.3*  NEUTROABS 10.3*  HGB 9.5*  HCT 28.8*  MCV 95.4  PLT 294   No results found for this basename: CKTOTAL:3,CKMB:3,CKMBINDEX:3,TROPONINI:3 in the last 72 hours No components found with this basename: POCBNP:3 No results found for this basename: DDIMER:2 in the last 72 hours No results found for this basename: HGBA1C:2 in the last 72 hours No results found for this basename: CHOL:2,HDL:2,LDLCALC:2,TRIG:2,CHOLHDL:2,LDLDIRECT:2 in the last 72 hours No results found for this basename: TSH,T4TOTAL,FREET3,T3FREE,THYROIDAB in the last 72 hours No results found for this basename: VITAMINB12:2,FOLATE:2,FERRITIN:2,TIBC:2,IRON:2,RETICCTPCT:2 in the last 72 hours  Micro Results: No results found for this or any previous visit (from the past 240 hour(s)).   Radiological Exams on Admission: No results found.  Assessment/Plan Present on Admission:  .GI hemorrhage .Diverticulosis of colon Admit to MedSurg N.p.o., IV fluid hydration Type and screen, serial H&H's Protonix I.V. GI Dr. Russella Dar  aware .Hypertension Stable resume home medications   Full code SCDs for DVT prophylaxis  Onika Gudiel 07/30/2012, 1:10 AM

## 2012-07-30 NOTE — Consult Note (Signed)
Reason for Consult:gi bleed Referring Physician: Dr. Hildred Laser is an 78 y.o. female.  HPI: 77 yof with history of a diverticular bleed earlier this year.  She underwent two colonoscopies per the gi note at that time.  First showed small amount of blood throughout the colon with some in TI.  No specific lesion was found.  Repeat was done that showed no blood in her colon but a small diverituculum in her TI and severe diverticulosis throughout the colon.  Earlier this week she had episode of nausea, diarrhea without blood.  Then last night she developed several brb bms and was brought to hospital.  She is receiving I think her third unit of blood now. We are asked to see for for presumed recurrent diverticular bleed. She does not have any significant complaints right now.  Past Medical History  Diagnosis Date  . Hypertension   . Reflux   . Aortic sclerosis   . Insomnia   . Colon polyps     Hx of  adenomatous  . Diverticulosis   . Hemorrhoids, internal, with bleeding 01/08/2011  . Arthritis   . GERD (gastroesophageal reflux disease)   . Anemia     Iron deficiency   . Allergy     Seasonal  . Anxiety   . Depression   . Pneumonia   . Blood transfusion     Past Surgical History  Procedure Date  . Cholecystectomy   . Upper gastrointestinal endoscopy   . Low anterior bowel resection 1996    diverticulitis  . Colonoscopy 01/2011  . Colonoscopy 11/18/2011    Procedure: COLONOSCOPY;  Surgeon: [name], MD;  Location: [facility];  Service: Endoscopy;  Laterality: N/A;  . Esophagogastroduodenoscopy 11/18/2011    Procedure: ESOPHAGOGASTRODUODENOSCOPY (EGD);  Surgeon: [name], MD;  Location: [facility];  Service: Endoscopy;  Laterality: N/A;  . Colonoscopy 11/24/2011    Procedure: COLONOSCOPY;  Surgeon: Erick Blinks, MD;  Location: [facility];  Service: Gastroenterology;  Laterality: N/A;  . Colon surgery   I think she has had prior sigmoid vs lar for polyps she  stated  Family History  Problem Relation Age of Onset  . Colon cancer Neg Hx   . Anesthesia problems Neg Hx   . Hypotension Neg Hx   . Malignant hyperthermia Neg Hx   . Pseudochol deficiency Neg Hx   . Pancreatic cancer Father     Social History:  reports that she has never smoked. She has never used smokeless tobacco. She reports that she drinks about 3.5 ounces of alcohol per week. She reports that she does not use illicit drugs.  Allergies:  Allergies  Allergen Reactions  . Alprazolam Rash    Medications: I have reviewed the patient's current medications.  Results for orders placed during the hospital encounter of 07/29/12 (from the past 48 hour(s))  CBC WITH DIFFERENTIAL     Status: Abnormal   Collection Time   07/29/12 11:21 PM      Component Value Range Comment   WBC 12.3 (*) 4.0 - 10.5 K/uL    RBC 3.02 (*) 3.87 - 5.11 MIL/uL    Hemoglobin 9.5 (*) 12.0 - 15.0 g/dL    HCT 40.9 (*) 81.1 - 46.0 %    MCV 95.4  78.0 - 100.0 fL    MCH 31.5  26.0 - 34.0 pg    MCHC 33.0  30.0 - 36.0 g/dL    RDW 91.4  78.2 - 95.6 %  Platelets 294  150 - 400 K/uL    Neutrophils Relative 84 (*) 43 - 77 %    Neutro Abs 10.3 (*) 1.7 - 7.7 K/uL    Lymphocytes Relative 10 (*) 12 - 46 %    Lymphs Abs 1.2  0.7 - 4.0 K/uL    Monocytes Relative 5  3 - 12 %    Monocytes Absolute 0.7  0.1 - 1.0 K/uL    Eosinophils Relative 1  0 - 5 %    Eosinophils Absolute 0.1  0.0 - 0.7 K/uL    Basophils Relative 0  0 - 1 %    Basophils Absolute 0.0  0.0 - 0.1 K/uL   COMPREHENSIVE METABOLIC PANEL     Status: Abnormal   Collection Time   07/29/12 11:21 PM      Component Value Range Comment   Sodium 134 (*) 135 - 145 mEq/L    Potassium 3.6  3.5 - 5.1 mEq/L    Chloride 100  96 - 112 mEq/L    CO2 23  19 - 32 mEq/L    Glucose, Bld 147 (*) 70 - 99 mg/dL    BUN 15  6 - 23 mg/dL    Creatinine, Ser 6.44  0.50 - 1.10 mg/dL    Calcium 8.5  8.4 - 03.4 mg/dL    Total Protein 6.1  6.0 - 8.3 g/dL    Albumin 2.9 (*)  3.5 - 5.2 g/dL    AST 17  0 - 37 U/L    ALT 14  0 - 35 U/L    Alkaline Phosphatase 96  39 - 117 U/L    Total Bilirubin 0.3  0.3 - 1.2 mg/dL    GFR calc non Af Amer 61 (*) >90 mL/min    GFR calc Af Amer 71 (*) >90 mL/min   PROTIME-INR     Status: Normal   Collection Time   07/29/12 11:21 PM      Component Value Range Comment   Prothrombin Time 14.5  11.6 - 15.2 seconds    INR 1.15  0.00 - 1.49   LACTIC ACID, PLASMA     Status: Normal   Collection Time   07/29/12 11:22 PM      Component Value Range Comment   Lactic Acid, Venous 1.9  0.5 - 2.2 mmol/L   TYPE AND SCREEN     Status: Normal (Preliminary result)   Collection Time   07/29/12 11:30 PM      Component Value Range Comment   ABO/RH(D) A POS      Antibody Screen NEG      Sample Expiration 08/01/2012      Unit Number [id number]      Blood Component Type RED CELLS,LR      Unit division 00      Status of Unit ISSUED      Transfusion Status OK TO TRANSFUSE      Crossmatch Result Compatible      Unit Number [id number]      Blood Component Type RED CELLS,LR      Unit division 00      Status of Unit ISSUED      Transfusion Status OK TO TRANSFUSE      Crossmatch Result Compatible      Unit Number [id number]      Blood Component Type RED CELLS,LR      Unit division 00      Status of Unit ISSUED      Transfusion Status  OK TO TRANSFUSE      Crossmatch Result Compatible      Unit Number [id number]      Blood Component Type RED CELLS,LR      Unit division 00      Status of Unit ALLOCATED      Transfusion Status OK TO TRANSFUSE      Crossmatch Result Compatible      Unit Number [id number]      Blood Component Type RED CELLS,LR      Unit division 00      Status of Unit ALLOCATED      Transfusion Status OK TO TRANSFUSE      Crossmatch Result Compatible      Unit Number [id number]      Blood Component Type RED CELLS,LR      Unit division 00      Status of Unit ALLOCATED      Transfusion Status OK TO  TRANSFUSE      Crossmatch Result Compatible     ABO/RH     Status: Normal   Collection Time   07/29/12 11:30 PM      Component Value Range Comment   ABO/RH(D) A POS     PREPARE RBC (CROSSMATCH)     Status: Normal   Collection Time   07/30/12  3:30 AM      Component Value Range Comment   Order Confirmation ORDER PROCESSED BY BLOOD BANK     PREPARE RBC (CROSSMATCH)     Status: Normal   Collection Time   07/30/12  3:30 AM      Component Value Range Comment   Order Confirmation ORDER PROCESSED BY BLOOD BANK     MRSA PCR SCREENING     Status: Normal   Collection Time   07/30/12  9:36 AM      Component Value Range Comment   MRSA by PCR NEGATIVE  NEGATIVE   PREPARE RBC (CROSSMATCH)     Status: Normal   Collection Time   07/30/12  1:05 PM      Component Value Range Comment   Order Confirmation ORDER PROCESSED BY BLOOD BANK       No results found.  Review of Systems  Constitutional: Negative for fever and chills.  Gastrointestinal: Positive for blood in stool. Negative for abdominal pain.   Blood pressure 139/65, pulse 87, temperature 97.8 F (36.6 C), temperature source Oral, resp. rate 20, height 5\' 6"  (1.676 m), weight 154 lb 12.2 oz (70.2 kg), SpO2 100.00%. Physical Exam  Vitals reviewed. Constitutional: She appears well-developed and well-nourished. She appears distressed.  Neck: Neck supple.  Cardiovascular: Regular rhythm and normal heart sounds.  Tachycardia present.   Respiratory: Effort normal and breath sounds normal. She has no wheezes. She has no rales.  GI: Soft. Bowel sounds are normal. There is no tenderness. No hernia.      Assessment/Plan: GI bleed  She appears to have lower gi bleed that would presumably be from diverticular source given her history.  This is significant bleed that certainly may require operative intervention if it does not stop.  It is also recurrent.  If she requires emergency operation it will be completion subtotal colectomy as she has  diverticuli throughout colon.  Ideally we can localize this and she does not need emergency right now. Agree with tagged scan followed by angiogram as first step to localize.   Dino Borntreger 07/30/2012, 3:30 PM

## 2012-07-30 NOTE — Progress Notes (Signed)
Pt began having frequent loose bright red bloody stool. Vital signs began to change. Heart rate had increased from 80's to sustaining within the 100's, blood pressure changed from 130-140/50-60 to 90-100/50-60. Pt stated she felt lethargic but denied any dizziness or light headedness. Dr. Russella Dar was notified and ordered nuclear med GI loss exam. Dr. Allena Katz was also notified and new order received to begin 1L bolus. Will accompany pt to Nuclear med and continue to monitor.

## 2012-07-30 NOTE — Consult Note (Signed)
Referring Provider: [facility] Primary Care Physician:  [name], MD Primary Gastroenterologist:  Catha Brow  Reason for Consultation:  Acute GI Bleed  HPI: Susan Henry is a 78 y.o. female known to Dr. Leone Payor with history of recurrent diverticular bleeding. Her history is also positive for chronic GERD, hypertension, aortic sclerosis, and history of adenomatous colon polyps.  Patient was admitted last night through the emergency room after acute onset of bright red blood per act him last evening at about 9 PM Patient had 4 episodes of passage of a large amount of bright red blood. She was hemodynamically stable in the emergency room the hemoglobin was low at 9.5 hematocrit of 28.8 . Her last hemoglobin in the system was 11.2 back in April of 2013. For early in the morning hours she had another large episode of bright red blood and became presyncopal with this and dropped her blood pressure into the 70s systolic . She was transferred to step down and is being transfused. There is no repeat hemoglobin available as yet.  Patient underwent endoscopy in January of 2013 and also had 2 colonoscopies during that admission all done for GI bleeding. First colonoscopy was done 11/18/2011 and at that time she had a small amount of blood throughout the colon backwashing into the terminal ileum. There was no culprit diverticulum identified. Repeat colonoscopy was done 11/24/2011. There is no blood in her colon at that time she did have a small diverticulum noted in the terminal ileum and severe diverticulosis from the ascending colon to the sigmoid colon noted. She's not had any problems with bleeding in the interim until last evening. She's not on any regular aspirin or NSAIDs  Pt feels better currently, no further bleeding since 4 am   Past Medical History  Diagnosis Date  . Hypertension   . Reflux   . Aortic sclerosis   . Insomnia   . Colon polyps     Hx of  adenomatous  .  Diverticulosis   . Hemorrhoids, internal, with bleeding 01/08/2011  . Arthritis   . GERD (gastroesophageal reflux disease)   . Anemia     Iron deficiency   . Allergy     Seasonal  . Anxiety   . Depression   . Pneumonia   . Blood transfusion     Past Surgical History  Procedure Date  . Cholecystectomy   . Upper gastrointestinal endoscopy   . Low anterior bowel resection 1996    diverticulitis  . Colonoscopy 01/2011  . Colonoscopy 11/18/2011    Procedure: COLONOSCOPY;  Surgeon: [name], MD;  Location: [facility];  Service: Endoscopy;  Laterality: N/A;  . Esophagogastroduodenoscopy 11/18/2011    Procedure: ESOPHAGOGASTRODUODENOSCOPY (EGD);  Surgeon: [name], MD;  Location: [facility];  Service: Endoscopy;  Laterality: N/A;  . Colonoscopy 11/24/2011    Procedure: COLONOSCOPY;  Surgeon: Erick Blinks, MD;  Location: [facility];  Service: Gastroenterology;  Laterality: N/A;  . Colon surgery     Prior to Admission medications   Medication Sig Start Date End Date Taking? Authorizing Provider  amLODipine (NORVASC) 5 MG tablet Take 5 mg by mouth daily.     Yes Historical Provider, MD  bisoprolol (ZEBETA) 5 MG tablet Take 5 mg by mouth daily.    Yes Historical Provider, MD  Calcium Carbonate (CALCIUM 600 PO) Take 1 tablet by mouth daily.     Yes Historical Provider, MD  furosemide (LASIX) 20 MG tablet Take 20 mg by mouth daily.  Yes Historical Provider, MD  lisinopril (PRINIVIL,ZESTRIL) 20 MG tablet Take 20 mg by mouth daily.     Yes Historical Provider, MD  RABEprazole (ACIPHEX) 20 MG tablet Take 20 mg by mouth daily.     Yes Historical Provider, MD  vitamin C (ASCORBIC ACID) 500 MG tablet Take 500 mg by mouth daily.   Yes Historical Provider, MD    Current Facility-Administered Medications  Medication Dose Route Frequency Provider Last Rate Last Dose  . 0.9 %  sodium chloride infusion  1,000 mL Intravenous Once Olivia Mackie, MD 999 mL/hr at 07/30/12 0001 1,000 mL at  07/30/12 0001  . 0.9 % NaCl with KCl 20 mEq/ L  infusion   Intravenous Continuous Debby Crosley, MD 75 mL/hr at 07/30/12 0653    . acetaminophen (TYLENOL) tablet 650 mg  650 mg Oral Q6H PRN Debby Crosley, MD       Or  . acetaminophen (TYLENOL) suppository 650 mg  650 mg Rectal Q6H PRN Debby Crosley, MD      . alum & mag hydroxide-simeth (MAALOX/MYLANTA) 200-200-20 MG/5ML suspension 30 mL  30 mL Oral Q6H PRN Debby Crosley, MD      . HYDROcodone-acetaminophen (NORCO/VICODIN) 5-325 MG per tablet 1-2 tablet  1-2 tablet Oral Q4H PRN Debby Crosley, MD      . ondansetron (ZOFRAN) tablet 4 mg  4 mg Oral Q6H PRN Debby Crosley, MD       Or  . ondansetron (ZOFRAN) injection 4 mg  4 mg Intravenous Q6H PRN Debby Crosley, MD      . pantoprazole (PROTONIX) EC tablet 40 mg  40 mg Oral Q1200 Debby Crosley, MD      . sodium chloride 0.9 % bolus 1,000 mL  1,000 mL Intravenous Once Vernona Rieger A [place], PA   1,000 mL at 07/30/12 0500  . white petrolatum (VASELINE) gel           . zolpidem (AMBIEN) tablet 5 mg  5 mg Oral QHS PRN Debby Crosley, MD      . DISCONTD: 0.9 %  sodium chloride infusion  1,000 mL Intravenous Continuous Olivia Mackie, MD 125 mL/hr at 07/30/12 0001 1,000 mL at 07/30/12 0001  . DISCONTD: amLODipine (NORVASC) tablet 5 mg  5 mg Oral Daily Debby Crosley, MD      . DISCONTD: bisoprolol (ZEBETA) tablet 5 mg  5 mg Oral Daily Debby Crosley, MD      . DISCONTD: lisinopril (PRINIVIL,ZESTRIL) tablet 20 mg  20 mg Oral Daily Debby Crosley, MD      . DISCONTD: ondansetron (ZOFRAN) injection 4 mg  4 mg Intravenous Q6H Olivia Mackie, MD   4 mg at 07/30/12 0006    Allergies as of 07/29/2012 - Review Complete 07/29/2012  Allergen Reaction Noted  . Alprazolam Rash 01/08/2011    Family History  Problem Relation Age of Onset  . Colon cancer Neg Hx   . Anesthesia problems Neg Hx   . Hypotension Neg Hx   . Malignant hyperthermia Neg Hx   . Pseudochol deficiency Neg Hx   . Pancreatic cancer Father     History     Social History  . Marital Status: Married    Spouse Name: N/A    Number of Children: N/A  . Years of Education: N/A   Occupational History  . Unemployed      Homemaker   Social History Main Topics  . Smoking status: Never Smoker   . Smokeless tobacco: Never Used  . Alcohol  Use: 3.5 oz/week    7 drink(s) per week  . Drug Use: No  . Sexually Active: Not on file   Other Topics Concern  . Not on file   Social History Narrative  . No narrative on file    Review of Systems: Pertinent positive and negative review of systems were noted in the above HPI section.  All other review of systems was otherwise negative.  Physical Exam: Vital signs in last 24 hours: Temp:  [97.4 F (36.3 C)-98.6 F (37 C)] 98.2 F (36.8 C) (10/13 0920) Pulse Rate:  [71-93] 81  (10/13 0920) Resp:  [14-29] 15  (10/13 0920) BP: (76-147)/(49-87) 146/74 mmHg (10/13 0905) SpO2:  [92 %-100 %] 100 % (10/13 0905) Weight:  [145 lb (65.772 kg)-154 lb 12.2 oz (70.2 kg)] 154 lb 12.2 oz (70.2 kg) (10/13 0220)   General:   Alert,  Well-developed, elderly female well-nourished, pleasant and cooperative in NAD Head:  Normocephalic and atraumatic. Eyes:  Sclera clear, no icterus.  Conjunctiva pale. Ears:  Normal auditory acuity. Nose:  No deformity, discharge,  or lesions. Mouth:  No deformity or lesions.   Neck:  Supple; no masses or thyromegaly. Lungs:  Clear throughout to auscultation.   No wheezes, crackles, or rhonchi.  Heart:  Regular rate and rhythm;systolic murmur Abdomen:  Soft,nontender, BS active,nonpalp mass or hsm.   Rectal:  Deferred, brb documented Msk:  Symmetrical without gross deformities. . Pulses:  Normal pulses noted. Extremities:  Without clubbing or edema. Neurologic:  Alert and  oriented x4;  grossly normal neurologically. Skin:  Intact without significant lesions or rashes.. Psych:  Alert and cooperative. Normal mood and affect.  Intake/Output from previous day: 10/12 0701 -  10/13 0700 In: 1420 [I.V.:1075; Blood:345] Out: -  Intake/Output this shift: Total I/O In: 162.5 [I.V.:150; Blood:12.5] Out: -   Lab Results:  Basename 07/29/12 2321  WBC 12.3*  HGB 9.5*  HCT 28.8*  PLT 294   BMET  Basename 07/29/12 2321  NA 134*  K 3.6  CL 100  CO2 23  GLUCOSE 147*  BUN 15  CREATININE 0.85  CALCIUM 8.5   LFT  Basename 07/29/12 2321  PROT 6.1  ALBUMIN 2.9*  AST 17  ALT 14  ALKPHOS 96  BILITOT 0.3  BILIDIR --  IBILI --   PT/INR  Basename 07/29/12 2321  LABPROT 14.5  INR 1.15   IMPRESSION:  #1 78 yo female with recurrent acute lower GI bleeding onset last pm and associated with presyncope and hypotension. She is stable currently hemodynamically and has not had any further bleeding over the past 7 hours #2 anemia- secondary to acute blood loss #3 history of acute diverticular bleed January 2013. Patient had 2 colonoscopies during that admission and has known severe pan diverticular disease #4 aortic sclerosis #5 history of hypertension  PLAN: #1 observe closely today for evidence of rebleeding, if she does rebleed would obtain stat bleeding scan and then consider arteriogram with selective embolization. #2 serial hemoglobins every 6 hours and transfuse to keep her hemoglobin above 9 #3 start clear liquids #4 surgical consult- pt has had 2 bleeds in the past 10 months requiring hospitalization Thank you, will follow with you  [name]  07/30/2012, 10:48 AM    I have taken a history, examined the patient and reviewed the chart. I agree with the extender's note, impression and recommendations. Acute lower GI bleed with hypotension, presumed to be another diverticular bleed. No clear need to repeat the colonoscopy  at this time. Tagged RBC scan and possible angiogram for recurrent bleeding. Surgical consult.   Meryl Dare MD [facility]

## 2012-07-30 NOTE — Progress Notes (Signed)
Nurse called and reported that patient had large bowel movement (melena). While in bathroom became pale and had syncope/near syncope. Became hypotensive as low as 70s systolic, HR 104. She was placed back in bed. IVF increased. Currently patient just reports feeling tired but denies any dizziness, chest pain. Spoke with Dr. Joneen Roach, will transport pt to SDU. Two units PRBC ordered to be transfused.

## 2012-07-30 NOTE — Progress Notes (Signed)
07/30/2012 report called to [facility] on 3300/ vs 97.4t /87/ bp 115/50/ 20r 100% 2L/

## 2012-07-30 NOTE — ED Provider Notes (Signed)
History     CSN: [id number]  Arrival date & time 07/29/12  2248   First MD Initiated Contact with Patient 07/29/12 2259      Chief Complaint  Patient presents with  . Rectal Bleeding    (Consider location/radiation/quality/duration/timing/severity/associated sxs/prior treatment) HPI 78 year old female presents emergency apartment complaining of 2 episodes of rectal bleeding. Patient reports episodes happened this afternoon, she reports large amounts of blood and clots passed per rectum. She has history of same in the past. She denies any abdominal pain or cramping associated with this bleeding. She is not on any blood thinners. Patient had episode of nausea and vomiting with EMS, denies any upper abdominal pain, and no vomiting since that time. Patient has history of bowel resection in 1996 secondary to diverticulitis. Her last colonoscopy was in February for similar presentation.  Past Medical History  Diagnosis Date  . Hypertension   . Reflux   . Hyperlipidemia   . Aortic sclerosis   . Insomnia   . Colon polyps     Hx of  adenomatous  . Diverticulosis   . Hemorrhoids, internal, with bleeding 01/08/2011  . Arthritis   . GERD (gastroesophageal reflux disease)   . Anemia     Iron deficiency   . Allergy     Seasonal  . Anxiety   . Depression   . Pneumonia   . Blood transfusion     Past Surgical History  Procedure Date  . Cholecystectomy   . Upper gastrointestinal endoscopy   . Low anterior bowel resection 1996    diverticulitis  . Colonoscopy 01/2011  . Colonoscopy 11/18/2011    Procedure: COLONOSCOPY;  Surgeon: [name], MD;  Location: [facility];  Service: Endoscopy;  Laterality: N/A;  . Esophagogastroduodenoscopy 11/18/2011    Procedure: ESOPHAGOGASTRODUODENOSCOPY (EGD);  Surgeon: [name], MD;  Location: [facility];  Service: Endoscopy;  Laterality: N/A;  . Colonoscopy 11/24/2011    Procedure: COLONOSCOPY;  Surgeon: Erick Blinks, MD;  Location: [facility]  ENDOSCOPY;  Service: Gastroenterology;  Laterality: N/A;    Family History  Problem Relation Age of Onset  . Colon cancer Neg Hx   . Anesthesia problems Neg Hx   . Hypotension Neg Hx   . Malignant hyperthermia Neg Hx   . Pseudochol deficiency Neg Hx   . Pancreatic cancer Father     History  Substance Use Topics  . Smoking status: Never Smoker   . Smokeless tobacco: Never Used  . Alcohol Use: 3.5 oz/week    7 drink(s) per week    OB History    Grav Para Term Preterm Abortions TAB SAB Ect Mult Living                  Review of Systems  All other systems reviewed and are negative.    Allergies  Alprazolam  Home Medications   Current Outpatient Rx  Name Route Sig Dispense Refill  . AMLODIPINE BESYLATE 5 MG PO TABS Oral Take 5 mg by mouth daily.      Marland Kitchen BISOPROLOL FUMARATE 5 MG PO TABS Oral Take 5 mg by mouth daily.     Marland Kitchen CALCIUM 600 PO Oral Take 1 tablet by mouth daily.      . FUROSEMIDE 20 MG PO TABS Oral Take 20 mg by mouth daily.      Marland Kitchen LISINOPRIL 20 MG PO TABS Oral Take 20 mg by mouth daily.      Marland Kitchen RABEPRAZOLE SODIUM 20 MG PO TBEC Oral Take 20 mg  by mouth daily.      Marland Kitchen VITAMIN C 500 MG PO TABS Oral Take 500 mg by mouth daily.      BP 105/58  Pulse 83  Temp 97.9 F (36.6 C) (Oral)  Resp 17  Ht 5\' 7"  (1.702 m)  Wt 145 lb (65.772 kg)  BMI 22.71 kg/m2  SpO2 98%  Physical Exam  Nursing note and vitals reviewed. Constitutional: She is oriented to person, place, and time. She appears well-developed and well-nourished.  HENT:  Head: Normocephalic and atraumatic.  Nose: Nose normal.  Mouth/Throat: Oropharynx is clear and moist.  Eyes: Conjunctivae normal and EOM are normal. Pupils are equal, round, and reactive to light.  Neck: Normal range of motion. Neck supple. No JVD present. No tracheal deviation present. No thyromegaly present.  Cardiovascular: Normal rate, regular rhythm, normal heart sounds and intact distal pulses.  Exam reveals no gallop and no  friction rub.   No murmur heard. Pulmonary/Chest: Effort normal and breath sounds normal. No stridor. No respiratory distress. She has no wheezes. She has no rales. She exhibits no tenderness.  Abdominal: Soft. Bowel sounds are normal. She exhibits no distension and no mass. There is no tenderness. There is no rebound and no guarding.  Genitourinary:       Rectal exam with bright red blood, no inflamed hemorrhoids noted no pain with rectal exam  Musculoskeletal: Normal range of motion. She exhibits no edema and no tenderness.  Lymphadenopathy:    She has no cervical adenopathy.  Neurological: She is alert and oriented to person, place, and time. She exhibits normal muscle tone. Coordination normal.  Skin: Skin is warm and dry. No rash noted. No erythema. No pallor.  Psychiatric: She has a normal mood and affect. Her behavior is normal. Judgment and thought content normal.    ED Course  Procedures (including critical care time)  Labs Reviewed  CBC WITH DIFFERENTIAL - Abnormal; Notable for the following:    WBC 12.3 (*)     RBC 3.02 (*)     Hemoglobin 9.5 (*)     HCT 28.8 (*)     Neutrophils Relative 84 (*)     Neutro Abs 10.3 (*)     Lymphocytes Relative 10 (*)     All other components within normal limits  PROTIME-INR  TYPE AND SCREEN  COMPREHENSIVE METABOLIC PANEL  LACTIC ACID, PLASMA   No results found.   Date: 07/30/2012  Rate: 81  Rhythm: normal sinus rhythm  QRS Axis: left  Intervals: normal  ST/T Wave abnormalities: normal  Conduction Disutrbances:left bundle branch block  Narrative Interpretation:   Old EKG Reviewed: none available    1. Lower GI bleeding   2. Anemia       MDM  78 year old female with rectal bleeding. We'll get standard GI bleeding labs. Expect admission to the hospital and evaluation by gastroenterology.        Olivia Mackie, MD 07/30/12 [phone]

## 2012-07-30 NOTE — Progress Notes (Signed)
0245 Pt went to rest room with assistance/ Pt had a large portwine stool in toliet/ pt became pale and was placed on  the floor by nurse and assistance/Pt then was incont of another stool/ vs 85/62 104 r 17 placed on 2 L o2 / Placed in bed and Iv fluid opened up/ Dr called and rapid response notified

## 2012-07-31 DIAGNOSIS — K5730 Diverticulosis of large intestine without perforation or abscess without bleeding: Secondary | ICD-10-CM

## 2012-07-31 DIAGNOSIS — I959 Hypotension, unspecified: Secondary | ICD-10-CM

## 2012-07-31 LAB — CBC
HCT: 27.7 % — ABNORMAL LOW (ref 36.0–46.0)
HCT: 26.2 % — ABNORMAL LOW (ref 36.0–46.0)
Hemoglobin: 9.6 g/dL — ABNORMAL LOW (ref 12.0–15.0)
Hemoglobin: 9.6 g/dL — ABNORMAL LOW (ref 12.0–15.0)
Hemoglobin: 9.1 g/dL — ABNORMAL LOW (ref 12.0–15.0)
MCH: 30.5 pg (ref 26.0–34.0)
MCHC: 34.7 g/dL (ref 30.0–36.0)
MCV: 87.9 fL (ref 78.0–100.0)
MCV: 88.2 fL (ref 78.0–100.0)
RBC: 3.15 MIL/uL — ABNORMAL LOW (ref 3.87–5.11)
RBC: 2.98 MIL/uL — ABNORMAL LOW (ref 3.87–5.11)
RDW: 16.1 % — ABNORMAL HIGH (ref 11.5–15.5)
WBC: 9.1 10*3/uL (ref 4.0–10.5)
WBC: 8.6 10*3/uL (ref 4.0–10.5)
WBC: 9.6 10*3/uL (ref 4.0–10.5)

## 2012-07-31 LAB — PREPARE RBC (CROSSMATCH)

## 2012-07-31 LAB — HEMOGLOBIN AND HEMATOCRIT, BLOOD: HCT: 22.2 % — ABNORMAL LOW (ref 36.0–46.0)

## 2012-07-31 MED ORDER — SODIUM CHLORIDE 0.9 % IV BOLUS (SEPSIS)
500.0000 mL | Freq: Once | INTRAVENOUS | Status: AC
  Administered 2012-07-31: 500 mL via INTRAVENOUS

## 2012-07-31 NOTE — Progress Notes (Signed)
[facility] PROGRESS NOTE  Susan Henry ZOX:[id number] DOB: [birth year] DOA: 07/29/2012 PCP: [name], MD  Assessment/Plan: 1. GI hemorrhage- prob diverticular bleed, tagged RBC scan negative, ?need for arteriogram with selective embolization vs surgery,  appreciate GI and surgery assistance, H/H q 6 hours, transfusing two more units this AM.   2. Hypotension- giving 2 units PRBCs, IVF, not currently symptomatic, holding home medications 3. ABLA- give 2 units today, serial h/hs 4. Leave in SDU for now for decreased Blood pressure   Code Status: full Family Communication: daughter at bedside Disposition Plan: home when work up complete   Consultants:  GI  surgery  Procedures:  Nuclear scan   HPI/Subjective: no abdominal pain but did have episodes of bleeding during the night Low BP this AM as well   Objective: Filed Vitals:   07/31/12 0610 07/31/12 0630 07/31/12 0635 07/31/12 0730  BP: 89/65 87/56 86/53  106/56  Pulse: 117 110 106 95  Temp: 97.6 F (36.4 C) 97.4 F (36.3 C)  98 F (36.7 C)  TempSrc: Oral Oral  Oral  Resp: 15 18 16 16   Height:      Weight:      SpO2:   100% 100%    Intake/Output Summary (Last 24 hours) at 07/31/12 0857 Last data filed at 07/31/12 0800  Gross per 24 hour  Intake 2247.5 ml  Output      3 ml  Net 2244.5 ml   Filed Weights   07/29/12 2305 07/30/12 0220  Weight: 65.772 kg (145 lb) 70.2 kg (154 lb 12.2 oz)    Exam:   General:  A+Ox3, NAD  Cardiovascular: rrr  Respiratory: clear anterior, no wheezing  Abdomen: +BS, soft, NT/ND  Data Reviewed: Basic Metabolic Panel:  Lab 07/29/12 8119  NA 134*  K 3.6  CL 100  CO2 23  GLUCOSE 147*  BUN 15  CREATININE 0.85  CALCIUM 8.5  MG --  PHOS --   Liver Function Tests:  Lab 07/29/12 2321  AST 17  ALT 14  ALKPHOS 96  BILITOT 0.3  PROT 6.1  ALBUMIN 2.9*   No results found for this basename: LIPASE:5,AMYLASE:5 in the last 168 hours No results  found for this basename: AMMONIA:5 in the last 168 hours CBC:  Lab 07/31/12 0400 07/30/12 2122 07/29/12 2321  WBC -- -- 12.3*  NEUTROABS -- -- 10.3*  HGB 7.9* 9.4* 9.5*  HCT 22.2* 27.5* 28.8*  MCV -- -- 95.4  PLT -- -- 294   Cardiac Enzymes: No results found for this basename: CKTOTAL:5,CKMB:5,CKMBINDEX:5,TROPONINI:5 in the last 168 hours BNP (last 3 results) No results found for this basename: PROBNP:3 in the last 8760 hours CBG: No results found for this basename: GLUCAP:5 in the last 168 hours  Recent Results (from the past 240 hour(s))  MRSA PCR SCREENING     Status: Normal   Collection Time   07/30/12  9:36 AM      Component Value Range Status Comment   MRSA by PCR NEGATIVE  NEGATIVE Final      Studies: Nm Gi Blood Loss  07/30/2012  *RADIOLOGY REPORT*  Clinical Data: Bright red blood per rectum with clots.  History of GI hemorrhage.  Diverticulosis.  Internal hemorrhoids with bleeding.  NUCLEAR MEDICINE GASTROINTESTINAL BLEEDING STUDY  Technique:  Sequential abdominal images were obtained following intravenous administration of Tc-69m labeled red blood cells.  Radiopharmaceutical: CURIE ULTRATAG TECHNETIUM TC 25M- LABELED RED BLOOD CELLS IV KIT  Comparison: None.  Findings: There is an  area of mildly increased tracer uptake within the right lower abdomen/upper pelvis, which is persistent and immobile throughout 2 hours of imaging.  On the lateral view, this is felt to be positioned posteriorly.  There is vague more mild similar uptake in the left lower abdomen/upper pelvis.  IMPRESSION:  1.  No evidence of active GI bleeding. 2.  Nonspecific low level tracer uptake within the posterior lower right abdomen/upper pelvis, immobile.  Question ptotic right kidney.  Abdominal pelvic CT could be performed for anatomic correlation.   Original Report Authenticated By: Consuello Bossier, M.D.     Scheduled Meds:   . pantoprazole  40 mg Oral Q1200  . sodium chloride  1,000 mL  Intravenous Once  . sodium chloride  500 mL Intravenous Once   Continuous Infusions:   . 0.9 % NaCl with KCl 20 mEq / L 75 mL/hr at 07/31/12 0600    Active Problems:  Iron deficiency anemia, unspecified  Hypertension  GI hemorrhage  Diverticulosis of colon (without mention of hemorrhage)    Time spent: 35 min    [name]  [facility] Pager [phone]. If 8PM-8AM, please contact night-coverage at [email or web address], password [facility] 07/31/2012, 8:57 AM  LOS: 2 days

## 2012-07-31 NOTE — Progress Notes (Signed)
Clinical Child psychotherapist (CSW) received a referral stating, "Pt states husband brings girlfriend to the house and wants to talk about options on if/how she can get him out of the house." CSW informed by unit RN of inappropriate referral. CSW available if any CSW needs arise.  Theresia Bough, MSW, Theresia Majors [phone]

## 2012-07-31 NOTE — Progress Notes (Signed)
  Subjective: Last BM with some blood around 2am, no abdominal pain  Objective: Vital signs in last 24 hours: Temp:  [97.4 F (36.3 C)-98.7 F (37.1 C)] 98 F (36.7 C) (10/14 0730) Pulse Rate:  [75-122] 95  (10/14 0730) Resp:  [9-23] 16  (10/14 0730) BP: (86-150)/(51-99) 106/56 mmHg (10/14 0730) SpO2:  [99 %-100 %] 100 % (10/14 0730)    Intake/Output from previous day: 10/13 0701 - 10/14 0700 In: 2122.5 [I.V.:1045; Blood:1077.5] Out: 3 [Urine:2; Stool:1] Intake/Output this shift: Total I/O In: 200 [I.V.:75; Blood:125] Out: -   General appearance: alert and cooperative Resp: clear to auscultation bilaterally Cardio: regular rate and rhythm GI: soft, NT, ND, +BS  Lab Results:   [facility] 07/31/12 0400 07/30/12 2122 07/29/12 2321  WBC -- -- 12.3*  HGB 7.9* 9.4* --  HCT 22.2* 27.5* --  PLT -- -- 294   BMET  Basename 07/29/12 2321  NA 134*  K 3.6  CL 100  CO2 23  GLUCOSE 147*  BUN 15  CREATININE 0.85  CALCIUM 8.5   PT/INR  Basename 07/29/12 2321  LABPROT 14.5  INR 1.15   ABG No results found for this basename: PHART:2,PCO2:2,PO2:2,HCO3:2 in the last 72 hours  Studies/Results: Nm Gi Blood Loss  07/30/2012  *RADIOLOGY REPORT*  Clinical Data: Bright red blood per rectum with clots.  History of GI hemorrhage.  Diverticulosis.  Internal hemorrhoids with bleeding.  NUCLEAR MEDICINE GASTROINTESTINAL BLEEDING STUDY  Technique:  Sequential abdominal images were obtained following intravenous administration of Tc-93m labeled red blood cells.  Radiopharmaceutical: CURIE ULTRATAG TECHNETIUM TC 82M- LABELED RED BLOOD CELLS IV KIT  Comparison: None.  Findings: There is an area of mildly increased tracer uptake within the right lower abdomen/upper pelvis, which is persistent and immobile throughout 2 hours of imaging.  On the lateral view, this is felt to be positioned posteriorly.  There is vague more mild similar uptake in the left lower abdomen/upper pelvis.   IMPRESSION:  1.  No evidence of active GI bleeding. 2.  Nonspecific low level tracer uptake within the posterior lower right abdomen/upper pelvis, immobile.  Question ptotic right kidney.  Abdominal pelvic CT could be performed for anatomic correlation.   Original Report Authenticated By: Consuello Bossier, M.D.     Anti-infectives: Anti-infectives    None      Assessment/Plan: LGIB suspect due to diverticuli - receiving transfusion now, tag RBC scan negative.  If Hb does not come up/bleeding continues would get angiogram as this  Can be both diagnostic and therapeutic.  GI is also following and their input is appreciated.  If bleeding does not stop, she will need completion abdominal colectomy and ileostomy (permanent).  She is hoping to avoid surgery.  I spoke to her and her daughter regarding the plan.  We will follow.   LOS: 2 days    Francile Woolford E 07/31/2012

## 2012-07-31 NOTE — Progress Notes (Signed)
     [facility] Daily Rounding Note 07/31/2012, 8:16 AM  SUBJECTIVE:       2 bloody BMs overnight. No abd pain.  Feels pretty well.   OBJECTIVE:         Vital signs in last 24 hours:    Temp:  [97.4 F (36.3 C)-98.7 F (37.1 C)] 98 F (36.7 C) (10/14 0730) Pulse Rate:  [75-122] 95  (10/14 0730) Resp:  [9-23] 16  (10/14 0730) BP: (86-150)/(51-99) 106/56 mmHg (10/14 0730) SpO2:  [99 %-100 %] 100 % (10/14 0730)   General: Looks elderly, a bit frail.   Heart: RRR with murmer. Chest: clear.  Abdomen: soft, NT, ND.  Active BS  Extremities: no pedal edema Neuro/Psych:  Pleasant, not confused  Intake/Output from previous day: 10/13 0701 - 10/14 0700 In: 2122.5 [I.V.:1045; Blood:1077.5] Out: 3 [Urine:2; Stool:1]  Intake/Output this shift: Total I/O In: 200 [I.V.:75; Blood:125] Out: -   Lab Results:  Basename 07/31/12 0400 07/30/12 2122 07/29/12 2321  WBC -- -- 12.3*  HGB 7.9* 9.4* 9.5*  HCT 22.2* 27.5* 28.8*  PLT -- -- 294   BMET  Basename 07/29/12 2321  NA 134*  K 3.6  CL 100  CO2 23  GLUCOSE 147*  BUN 15  CREATININE 0.85  CALCIUM 8.5   LFT  Basename 07/29/12 2321  PROT 6.1  ALBUMIN 2.9*  AST 17  ALT 14  ALKPHOS 96  BILITOT 0.3  BILIDIR --  IBILI --   PT/INR  Basename 07/29/12 2321  LABPROT 14.5  INR 1.15   Studies/Results: [facility] Blood Loss 07/30/2012 IMPRESSION:  1.  No evidence of active GI bleeding. 2.  Nonspecific low level tracer uptake within the posterior lower right abdomen/upper pelvis, immobile.  Question ptotic right kidney.  Abdominal pelvic CT could be performed for anatomic correlation.   Original Report Authenticated By: Consuello Bossier, M.D.     ASSESMENT: *  LGIB, diverticular.  2 colonoscopies in Jan 2013 confirming tics. tagged cell scan 10/13, about one hour post height of bleeding acuity on 10/14, was negative.  Surgery is following.  *  ABL anemia.  S/P 5 units PRBCs thus far, to get unit # 6 today.       PLAN: *   Transfuse.  Consider Angiogram if recurrent bleeding, but would need to be ok'd by IR, given the negative tagged scan.  Last resort is total abdominal colectomy.  *  Let her have clears.    LOS: 2 days   Jennye Moccasin  07/31/2012, 8:16 AM Pager: [phone]    ________________________________________________________________________  [facility] MD note:  I personally examined the patient, reviewed the data and agree with the assessment and plan described above.  She is getting her 6th unit of PRMC this AM. Last admission (several months ago) she only required 1 unit blood and so this is obviously a more significant bleed.  Her last overt bleeding was about 12 hours ago.  NUC scan yesterday negative but if she has clear, overt rebleeding again today we should try to expedite repeat Nuc Scan to help localize the region in (likely) colon.  She probably will still have nuc med tracer in her blood throughout the day today. (would ask radiology before setting up test though).  Will follow along, agree with liquids for now.   [name], MD [facility] Pager [phone]

## 2012-08-01 DIAGNOSIS — D649 Anemia, unspecified: Secondary | ICD-10-CM

## 2012-08-01 DIAGNOSIS — I10 Essential (primary) hypertension: Secondary | ICD-10-CM

## 2012-08-01 LAB — CBC
HCT: 24.6 % — ABNORMAL LOW (ref 36.0–46.0)
Hemoglobin: 8.6 g/dL — ABNORMAL LOW (ref 12.0–15.0)
MCH: 30.8 pg (ref 26.0–34.0)
MCH: 30 pg (ref 26.0–34.0)
MCHC: 35 g/dL (ref 30.0–36.0)
MCV: 88.2 fL (ref 78.0–100.0)
MCV: 88.8 fL (ref 78.0–100.0)
Platelets: 154 K/uL (ref 150–400)
Platelets: 154 10*3/uL (ref 150–400)
RBC: 2.79 MIL/uL — ABNORMAL LOW (ref 3.87–5.11)
RDW: 15.9 % — ABNORMAL HIGH (ref 11.5–15.5)
RDW: 16 % — ABNORMAL HIGH (ref 11.5–15.5)
WBC: 7.8 K/uL (ref 4.0–10.5)
WBC: 9.3 10*3/uL (ref 4.0–10.5)

## 2012-08-01 MED ORDER — SODIUM CHLORIDE 0.9 % IJ SOLN
INTRAMUSCULAR | Status: AC
  Administered 2012-08-01: 10 mL
  Filled 2012-08-01: qty 10

## 2012-08-01 MED ORDER — LISINOPRIL 20 MG PO TABS
20.0000 mg | ORAL_TABLET | Freq: Every day | ORAL | Status: DC
  Administered 2012-08-01 – 2012-08-02 (×2): 20 mg via ORAL
  Filled 2012-08-01 (×2): qty 1

## 2012-08-01 MED ORDER — WHITE PETROLATUM GEL
Status: AC
  Filled 2012-08-01: qty 5

## 2012-08-01 NOTE — Progress Notes (Signed)
     [facility] Daily Rounding Note 08/01/2012, 8:31 AM  SUBJECTIVE:       No stools or BPR.  Not dizzy when she stands.  No nausea or abd pain.  OBJECTIVE:         Vital signs in last 24 hours:    Temp:  [97 F (36.1 C)-99 F (37.2 C)] 98 F (36.7 C) (10/15 0700) Pulse Rate:  [87-114] 114  (10/15 0300) Resp:  [7-23] 15  (10/15 0300) BP: (100-142)/(55-78) 130/73 mmHg (10/15 0300) SpO2:  [95 %-100 %] 96 % (10/15 0300) Last BM Date: 07/31/12 General: elderly, looks well   Heart: RRR with murmer Chest: clear B Abdomen: soft, NT, ND.  No HSM.  Active BS  Extremities: no pedal edema Neuro/Psych:  Pleasant, not anxious.  No gross deficits.   Intake/Output from previous day: 10/14 0701 - 10/15 0700 In: 3252.5 [P.O.:840; I.V.:1750; Blood:662.5] Out: 825 [Urine:825]  Intake/Output this shift:    Lab Results:  Basename 08/01/12 0527 07/31/12 2209 07/31/12 1633  WBC 7.8 9.6 8.6  HGB 8.6* 9.1* 9.6*  HCT 24.6* 26.2* 27.7*  PLT 154 163 147*   BMET  Basename 07/29/12 2321  NA 134*  K 3.6  CL 100  CO2 23  GLUCOSE 147*  BUN 15  CREATININE 0.85  CALCIUM 8.5   LFT  Basename 07/29/12 2321  PROT 6.1  ALBUMIN 2.9*  AST 17  ALT 14  ALKPHOS 96  BILITOT 0.3  BILIDIR --  IBILI --     ASSESMENT: * LGIB, diverticular. 2 colonoscopies in Jan 2013 confirming tics. tagged cell scan 10/13, about one hour post height of bleeding acuity on 10/14, was negative.  Surgery is following.  * ABL anemia. S/P 6 units PRBCs thus far. Hgb fairly stable.  *  Hyperglycemia.    PLAN: *  Advance to solids.  Ambulate pt.  D/c IVF *  CBC in AM  *  Move to non-tele bed?   LOS: 3 days   Susan Henry  08/01/2012, 8:31 AM Pager: [phone]    ________________________________________________________________________  [facility] MD note:  I personally examined the patient, reviewed the data and agree with the assessment and plan described above.  Feels well, no BM in about 24 hours (was  dark, no red blood).  Hungry, wants to advance diet.  IF no overt rebleeding, probably safe to go home tomorrow.   [name], MD [facility] Pager [phone]

## 2012-08-01 NOTE — Progress Notes (Signed)
Utilization review completed.  

## 2012-08-01 NOTE — Progress Notes (Signed)
Bleeding seems to have stopped.  I D/W GI PA at the bedside. Patient examined and I agree with the assessment and plan  Violeta Gelinas, MD, MPH, FACS Pager: [phone]  08/01/2012 1:20 PM

## 2012-08-01 NOTE — Progress Notes (Signed)
  Subjective: Appears comfortable this am, eating breakfast; denies any further abdominal pain or bloody stools. Daughter at bedside.  Objective: Vital signs in last 24 hours: Temp:  [97 F (36.1 C)-99 F (37.2 C)] 98 F (36.7 C) (10/15 0700) Pulse Rate:  [87-114] 114  (10/15 0300) Resp:  [7-23] 15  (10/15 0300) BP: (100-142)/(55-78) 130/73 mmHg (10/15 0300) SpO2:  [95 %-100 %] 96 % (10/15 0300) Last BM Date: 07/31/12  Intake/Output from previous day: 10/14 0701 - 10/15 0700 In: 3252.5 [P.O.:840; I.V.:1750; Blood:662.5] Out: 825 [Urine:825] Intake/Output this shift:    General appearance: alert, cooperative, appears stated age and no distress Chest: CTA bilaterally Cardiac:tachycardic at rest 90's sinus by monitor.  Abdomen: soft, non tender, no bloating, No further report of "bloody stools" since yesterday. + BS No NV. VSS, afebrile, H&H down slightly from yesterday's readings 8.6/24.6 vs 9.1/26.2  Plt 154 vs 163. INR 1.15  Lab Results:   Basename 08/01/12 0527 07/31/12 2209  WBC 7.8 9.6  HGB 8.6* 9.1*  HCT 24.6* 26.2*  PLT 154 163   BMET  Basename 07/29/12 2321  NA 134*  K 3.6  CL 100  CO2 23  GLUCOSE 147*  BUN 15  CREATININE 0.85  CALCIUM 8.5   PT/INR  Basename 07/29/12 2321  LABPROT 14.5  INR 1.15   ABG No results found for this basename: PHART:2,PCO2:2,PO2:2,HCO3:2 in the last 72 hours  Studies/Results: [facility] Blood Loss  07/30/2012  *RADIOLOGY REPORT*  Clinical Data: Bright red blood per rectum with clots.  History of GI hemorrhage.  Diverticulosis.  Internal hemorrhoids with bleeding.  NUCLEAR MEDICINE GASTROINTESTINAL BLEEDING STUDY  Technique:  Sequential abdominal images were obtained following intravenous administration of Tc-30m labeled red blood cells.  Radiopharmaceutical: CURIE ULTRATAG TECHNETIUM TC 48M- LABELED RED BLOOD CELLS IV KIT  Comparison: None.  Findings: There is an area of mildly increased tracer uptake within the right  lower abdomen/upper pelvis, which is persistent and immobile throughout 2 hours of imaging.  On the lateral view, this is felt to be positioned posteriorly.  There is vague more mild similar uptake in the left lower abdomen/upper pelvis.  IMPRESSION:  1.  No evidence of active GI bleeding. 2.  Nonspecific low level tracer uptake within the posterior lower right abdomen/upper pelvis, immobile.  Question ptotic right kidney.  Abdominal pelvic CT could be performed for anatomic correlation.   Original Report Authenticated By: Consuello Bossier, M.D.     Anti-infectives: Anti-infectives    None      Assessment/Plan:  s/p * No surgery found * LGIB secondary to suspected diverticula   Management per medicine team No surgical intervention at this time Continue to monitor Labs    LOS: 3 days    Susan Henry 08/01/2012

## 2012-08-01 NOTE — Progress Notes (Addendum)
Patient transferred to [facility] room 14 by this RN. Nurse tech called to room to arrive patient and patients nurse was  notified via secretary of patients arrival. VSS. Patients husband followed and present on patients arrival. No further needs at this time.  Wyn Quaker RN

## 2012-08-01 NOTE — Progress Notes (Signed)
[facility] PROGRESS NOTE  Susan Henry WUJ:[id number] DOB: 08/03/1925 DOA: 07/29/2012 PCP: [name], MD  Assessment/Plan: 1. GI hemorrhage- prob diverticular bleed, tagged RBC scan negative,  appreciate GI and surgery assistance, has given 6 units PRBCs so far. 2. Hypotension- s/p 6 units PRBCs,  not currently symptomatic, resume home medications 3. ABLA- cbc in AM, Hgb stable 4. tx out SDU to med surg bed- ?home to go home if not further bleeding tomm   Code Status: full Family Communication: daughter at bedside Disposition Plan: home when work up complete   Consultants:  GI  surgery  Procedures:  Nuclear scan   HPI/Subjective: Much better today hungry  Objective: Filed Vitals:   08/01/12 0900 08/01/12 1000 08/01/12 1100 08/01/12 1200  BP:      Pulse: 89 87 88 83  Temp:    97.9 F (36.6 C)  TempSrc:    Oral  Resp: 18 16 18 21   Height:      Weight:      SpO2: 96% 92% 98% 98%    Intake/Output Summary (Last 24 hours) at 08/01/12 1243 Last data filed at 08/01/12 0800  Gross per 24 hour  Intake   2390 ml  Output    825 ml  Net   1565 ml   Filed Weights   07/29/12 2305 07/30/12 0220  Weight: 65.772 kg (145 lb) 70.2 kg (154 lb 12.2 oz)    Exam:   General:  A+Ox3, NAD  Cardiovascular: rrr  Respiratory: clear anterior, no wheezing  Abdomen: +BS, soft, NT/ND  Data Reviewed: Basic Metabolic Panel:  Lab 07/29/12 9562  NA 134*  K 3.6  CL 100  CO2 23  GLUCOSE 147*  BUN 15  CREATININE 0.85  CALCIUM 8.5  MG --  PHOS --   Liver Function Tests:  Lab 07/29/12 2321  AST 17  ALT 14  ALKPHOS 96  BILITOT 0.3  PROT 6.1  ALBUMIN 2.9*   No results found for this basename: LIPASE:5,AMYLASE:5 in the last 168 hours No results found for this basename: AMMONIA:5 in the last 168 hours CBC:  Lab 08/01/12 1006 08/01/12 0527 07/31/12 2209 07/31/12 1633 07/31/12 1458 07/29/12 2321  WBC 9.3 7.8 9.6 8.6 9.1 --  NEUTROABS -- -- -- --  -- 10.3*  HGB 9.6* 8.6* 9.1* 9.6* 9.6* --  HCT 28.4* 24.6* 26.2* 27.7* 27.7* --  MCV 88.8 88.2 87.9 88.2 87.9 --  PLT 154 154 163 147* 67* --   Cardiac Enzymes: No results found for this basename: CKTOTAL:5,CKMB:5,CKMBINDEX:5,TROPONINI:5 in the last 168 hours BNP (last 3 results) No results found for this basename: PROBNP:3 in the last 8760 hours CBG: No results found for this basename: GLUCAP:5 in the last 168 hours  Recent Results (from the past 240 hour(s))  MRSA PCR SCREENING     Status: Normal   Collection Time   07/30/12  9:36 AM      Component Value Range Status Comment   MRSA by PCR NEGATIVE  NEGATIVE Final      Studies: [facility] Gi Blood Loss  07/30/2012  *RADIOLOGY REPORT*  Clinical Data: Bright red blood per rectum with clots.  History of GI hemorrhage.  Diverticulosis.  Internal hemorrhoids with bleeding.  NUCLEAR MEDICINE GASTROINTESTINAL BLEEDING STUDY  Technique:  Sequential abdominal images were obtained following intravenous administration of Tc-37m labeled red blood cells.  Radiopharmaceutical: CURIE ULTRATAG TECHNETIUM TC 29M- LABELED RED BLOOD CELLS IV KIT  Comparison: None.  Findings: There is an area of  mildly increased tracer uptake within the right lower abdomen/upper pelvis, which is persistent and immobile throughout 2 hours of imaging.  On the lateral view, this is felt to be positioned posteriorly.  There is vague more mild similar uptake in the left lower abdomen/upper pelvis.  IMPRESSION:  1.  No evidence of active GI bleeding. 2.  Nonspecific low level tracer uptake within the posterior lower right abdomen/upper pelvis, immobile.  Question ptotic right kidney.  Abdominal pelvic CT could be performed for anatomic correlation.   Original Report Authenticated By: Consuello Bossier, M.D.     Scheduled Meds:    . lisinopril  20 mg Oral Daily  . pantoprazole  40 mg Oral Q1200  . sodium chloride       Continuous Infusions:    . DISCONTD: 0.9 % NaCl with KCl  20 mEq / L Stopped (08/01/12 1000)    Active Problems:  Iron deficiency anemia, unspecified  Hypertension  GI hemorrhage  Diverticulosis of colon (without mention of hemorrhage)  Hypotension    Time spent: 25 min    Delsin Copen  [facility] Pager [phone]. If 8PM-8AM, please contact night-coverage at [email or web address], password [facility] 08/01/2012, 12:43 PM  LOS: 3 days

## 2012-08-02 LAB — TYPE AND SCREEN
ABO/RH(D): A POS
Antibody Screen: NEGATIVE
Unit division: 0
Unit division: 0
Unit division: 0
Unit division: 0
Unit division: 0
Unit division: 0
Unit division: 0
Unit division: 0

## 2012-08-02 LAB — CBC
Hemoglobin: 8.1 g/dL — ABNORMAL LOW (ref 12.0–15.0)
MCHC: 34.6 g/dL (ref 30.0–36.0)
RDW: 16 % — ABNORMAL HIGH (ref 11.5–15.5)

## 2012-08-02 LAB — HEMOGLOBIN AND HEMATOCRIT, BLOOD
HCT: 24.9 % — ABNORMAL LOW (ref 36.0–46.0)
Hemoglobin: 8.5 g/dL — ABNORMAL LOW (ref 12.0–15.0)

## 2012-08-02 NOTE — Progress Notes (Signed)
  Subjective: No more bloody BMs, up ambulating in hall  Objective: Vital signs in last 24 hours: Temp:  [97.6 F (36.4 C)-98.8 F (37.1 C)] 97.6 F (36.4 C) (10/16 0852) Pulse Rate:  [83-102] 93  (10/16 0852) Resp:  [16-21] 18  (10/16 0558) BP: (123-156)/(60-76) 156/73 mmHg (10/16 0852) SpO2:  [91 %-98 %] 97 % (10/16 0852) Last BM Date: 07/31/12  Intake/Output from previous day: 10/15 0701 - 10/16 0700 In: 75 [I.V.:75] Out: -  Intake/Output this shift: Total I/O In: 360 [P.O.:360] Out: -   AlertAmbulating in hall with daughter  Lab Results:   Basename 08/02/12 0918 08/02/12 0600 08/01/12 1006  WBC -- 6.8 9.3  HGB 8.5* 8.1* --  HCT 24.9* 23.4* --  PLT -- 197 154   BMET No results found for this basename: NA:2,K:2,CL:2,CO2:2,GLUCOSE:2,BUN:2,CREATININE:2,CALCIUM:2 in the last 72 hours PT/INR No results found for this basename: LABPROT:2,INR:2 in the last 72 hours ABG No results found for this basename: PHART:2,PCO2:2,PO2:2,HCO3:2 in the last 72 hours  Studies/Results: No results found.  Anti-infectives: Anti-infectives    None      Assessment/Plan: s/p * No surgery found * LGIB likely diverticular - seems to have stopped, Hb down some and this has been repeated, appreciate GI input, no surgery required at this time  LOS: 4 days    Jamonte Curfman E 08/02/2012

## 2012-08-02 NOTE — Progress Notes (Signed)
She was discharged before before I was able to round at [facility], however I agree with the plan, note by Jennye Moccasin.

## 2012-08-02 NOTE — Progress Notes (Signed)
Discharge home.

## 2012-08-02 NOTE — Progress Notes (Signed)
     [facility] Daily Rounding Note 08/02/2012, 8:31 AM  SUBJECTIVE:       No stools.  Walking in hall. Eating well  OBJECTIVE:         Vital signs in last 24 hours:    Temp:  [97.9 F (36.6 C)-98.8 F (37.1 C)] 98 F (36.7 C) (10/16 0558) Pulse Rate:  [83-102] 90  (10/16 0558) Resp:  [16-21] 18  (10/16 0558) BP: (123-140)/(60-76) 134/60 mmHg (10/16 0558) SpO2:  [91 %-98 %] 97 % (10/16 0558) Last BM Date: 07/31/12 General: elderly, not acutely ill.    Heart: RRR Chest: clear, no dyspnea Abdomen: soft, NT, ND, no HSM or mass  Extremities: no pedal edema Neuro/Psych:  Pleasant, alert, not confused.   Lab Results:  Basename 08/02/12 0600 08/01/12 1006 08/01/12 0527  WBC 6.8 9.3 7.8  HGB 8.1* 9.6* 8.6*  HCT 23.4* 28.4* 24.6*  PLT 197 154 154   ASSESMENT: * LGIB, diverticular. 2 colonoscopies in Jan 2013 confirming tics. Tagged cell scan 10/13, about one hour post height of bleeding acuity on 10/14, was negative. No BM or BPR in > 48 hours.  Surgery is following.  * ABL anemia. S/P 6 units PRBCs thus far. Hgb down but pt not symptomatic.       PLAN: *  H & H at 10 AM *  Family asked about an Iron supplement.  Pt not known to be Iron deficient (see labs of 01/08/11) and MCV is normal.   I recommended purchase of one small, 30 to 60 day supply, of MVI with iron which she could use until she finishes this up.  *  Will need follow up H & H within the next 7 days.  Can be done at primary care office, Dr Shaune Pollack.    Assendum:  1103 AM.   The recheck Hgb is 8.5.  I believe, given lack of gross bleeding and no anemia sxs, she is ok for discharge today.  Should have the H & H rechecked nest week.    LOS: 4 days   Jennye Moccasin  08/02/2012, 8:31 AM Pager: [phone]

## 2012-08-02 NOTE — Discharge Summary (Signed)
[facility]                                                                                   Susan Henry, is a 78 y.o. female  DOB 11-10-1924  MRN [id number].  Admission date:  07/29/2012  Discharge Date:  08/02/2012  Primary MD  [name], MD  Admitting Physician  Gery Pray, MD  Admission Diagnosis  Lower GI bleeding [578.9] Anemia [285.9]  Discharge Diagnosis     Active Problems:  Iron deficiency anemia, unspecified  Hypertension  GI hemorrhage  Diverticulosis of colon (without mention of hemorrhage)  Hypotension    Past Medical History  Diagnosis Date  . Hypertension   . Reflux   . Aortic sclerosis   . Insomnia   . Colon polyps     Hx of  adenomatous  . Diverticulosis   . Hemorrhoids, internal, with bleeding 01/08/2011  . Arthritis   . GERD (gastroesophageal reflux disease)   . Anemia     Iron deficiency   . Allergy     Seasonal  . Anxiety   . Depression   . Pneumonia   . Blood transfusion     Past Surgical History  Procedure Date  . Cholecystectomy   . Upper gastrointestinal endoscopy   . Low anterior bowel resection 1996    diverticulitis  . Colonoscopy 01/2011  . Colonoscopy 11/18/2011    Procedure: COLONOSCOPY;  Surgeon: [name], MD;  Location: [facility];  Service: Endoscopy;  Laterality: N/A;  . Esophagogastroduodenoscopy 11/18/2011    Procedure: ESOPHAGOGASTRODUODENOSCOPY (EGD);  Surgeon: [name], MD;  Location: [facility];  Service: Endoscopy;  Laterality: N/A;  . Colonoscopy 11/24/2011    Procedure: COLONOSCOPY;  Surgeon: Erick Blinks, MD;  Location: [facility];  Service: Gastroenterology;  Laterality: N/A;  . Colon surgery      Recommendations for primary care physician for things to follow:   Monitor H&H closely have patient follow with GI physician as outpatient.   Discharge Diagnoses:   Active Problems:  Iron deficiency anemia, unspecified  Hypertension  GI hemorrhage  Diverticulosis of colon (without mention of hemorrhage)  Hypotension    Discharge Condition: Stable   Diet recommendation: See Discharge Instructions below   Consults GI and general surgery   History of present illness and  Hospital Course:  See H&P, Labs, Consult and Test reports for all details in brief, patient was admitted for massive lower GI bleed likely diverticular in nature requiring 6 units of packed RBC transfusion, bleeding stopped over 24 hours ago with stable H&H, patient completely symptom-free with no occurrence of bleeding whatsoever. She had a negative tagged RBC nuclear scan this admission, Will be discharged home, have discussed this with GI, patient to follow with primary care physician and get H&H checked in 2 days, outpatient followup with GI in one week.  Agent has history of iron deficiency anemia will request primary care physician to please check an anemia panel on next visit and address as appropriate.    For her hypertension patient will continue her home medications.  Today   Subjective:   Susan Henry today has no headache,no chest abdominal pain,no new weakness  tingling or numbness, feels much better wants to go home today.    Objective:   Blood pressure 156/73, pulse 93, temperature 97.6 F (36.4 C), temperature source Oral, resp. rate 18, height 5\' 6"  (1.676 m), weight 70.2 kg (154 lb 12.2 oz), SpO2 97.00%.   Intake/Output Summary (Last 24 hours) at 08/02/12 1020 Last data filed at 08/02/12 0900  Gross per 24 hour  Intake    360 ml  Output      0 ml  Net    360 ml    Exam Awake Alert, Oriented *3, No new F.N deficits, Normal affect [place].AT,PERRAL Supple Neck,No JVD, No cervical lymphadenopathy appriciated.  Symmetrical Chest wall movement, Good air movement bilaterally, CTAB RRR,No Gallops,Rubs or new Murmurs, No Parasternal Heave +ve B.Sounds, Abd Soft, Non tender, No organomegaly appriciated, No rebound -guarding or rigidity. No  Cyanosis, Clubbing or edema, No new Rash or bruise  Data Review   Major procedures and Radiology Reports - PLEASE review detailed and final reports for all details in brief -       Nm Gi Blood Loss  07/30/2012  *RADIOLOGY REPORT*  Clinical Data: Bright red blood per rectum with clots.  History of GI hemorrhage.  Diverticulosis.  Internal hemorrhoids with bleeding.  NUCLEAR MEDICINE GASTROINTESTINAL BLEEDING STUDY  Technique:  Sequential abdominal images were obtained following intravenous administration of Tc-11m labeled red blood cells.  Radiopharmaceutical: CURIE ULTRATAG TECHNETIUM TC 44M- LABELED RED BLOOD CELLS IV KIT  Comparison: None.  Findings: There is an area of mildly increased tracer uptake within the right lower abdomen/upper pelvis, which is persistent and immobile throughout 2 hours of imaging.  On the lateral view, this is felt to be positioned posteriorly.  There is vague more mild similar uptake in the left lower abdomen/upper pelvis.  IMPRESSION:  1.  No evidence of active GI bleeding. 2.  Nonspecific low level tracer uptake within the posterior lower right abdomen/upper pelvis, immobile.  Question ptotic right kidney.  Abdominal pelvic CT could be performed for anatomic correlation.   Original Report Authenticated By: Consuello Bossier, M.D.     Micro Results      Recent Results (from the past 240 hour(s))  MRSA PCR SCREENING     Status: Normal   Collection Time   07/30/12  9:36 AM      Component Value Range Status Comment   MRSA by PCR NEGATIVE  NEGATIVE Final      CBC w Diff: Lab Results  Component Value Date   WBC 6.8 08/02/2012   HGB 8.5* 08/02/2012   HCT 24.9* 08/02/2012   PLT 197 08/02/2012   LYMPHOPCT 10* 07/29/2012   MONOPCT 5 07/29/2012   EOSPCT 1 07/29/2012   BASOPCT 0 07/29/2012    CMP: Lab Results  Component Value Date   NA 134* 07/29/2012   K 3.6 07/29/2012   CL 100 07/29/2012   CO2 23 07/29/2012   BUN 15 07/29/2012   CREATININE 0.85  07/29/2012   PROT 6.1 07/29/2012   ALBUMIN 2.9* 07/29/2012   BILITOT 0.3 07/29/2012   ALKPHOS 96 07/29/2012   AST 17 07/29/2012   ALT 14 07/29/2012  .   Discharge Instructions     Follow with Primary MD [name], MD in 2 days   Get CBC, CMP, checked 2 days by Primary MD and again as instructed by your Primary MD.    Get Medicines reviewed and adjusted.  Please request your Prim.MD to go over all Hospital Tests  and Procedure/Radiological results at the follow up, please get all Hospital records sent to your Prim MD by signing hospital release before you go home.  Activity: As tolerated with Full fall precautions use walker/cane & assistance as needed   Diet:  Heart healthy  For Heart failure patients - Check your Weight same time everyday, if you gain over 2 pounds, or you develop in leg swelling, experience more shortness of breath or chest pain, call your Primary MD immediately. Follow Cardiac Low Salt Diet and 1.8 lit/day fluid restriction.  Disposition Home   If you experience worsening of your admission symptoms, develop shortness of breath, life threatening emergency, suicidal or homicidal thoughts you must seek medical attention immediately by calling 911 or calling your MD immediately  if symptoms less severe.  You Must read complete instructions/literature along with all the possible adverse reactions/side effects for all the Medicines you take and that have been prescribed to you. Take any new Medicines after you have completely understood and accpet all the possible adverse reactions/side effects.   Do not drive and provide baby sitting services if your were admitted for syncope or siezures until you have seen by Primary MD or a Neurologist and advised to do so again.  Do not drive when taking Pain medications.    Do not take more than prescribed Pain, Sleep and Anxiety Medications  Special Instructions: If you have smoked or chewed Tobacco  in the last 2 yrs  please stop smoking, stop any regular Alcohol  and or any Recreational drug use.  Wear Seat belts while driving.  Follow-up Information    Follow up with [name], MD. Schedule an appointment as soon as possible for a visit in 2 days.   Contact information:   [address] [place] [address] [phone]       Follow up with [name], MD. Schedule an appointment as soon as possible for a visit in 1 week.   Contact information:   [address] [place] [address] [phone]            Discharge Medications     Medication List     As of 08/02/2012 10:20 AM    CONTINUE taking these medications         ACIPHEX 20 MG tablet   Generic drug: RABEprazole      bisoprolol 5 MG tablet   Commonly known as: ZEBETA      CALCIUM 600 PO      LASIX 20 MG tablet   Generic drug: furosemide      lisinopril 20 MG tablet   Commonly known as: PRINIVIL,ZESTRIL      NORVASC 5 MG tablet   Generic drug: amLODipine      vitamin C 500 MG tablet   Commonly known as: ASCORBIC ACID           Total Time in preparing paper work, data evaluation and todays exam - 35 minutes  Susa Raring K M.D on 08/02/2012 at 10:20 AM  [facility] Office  [phone]

## 2012-08-03 ENCOUNTER — Telehealth: Payer: Self-pay | Admitting: Gastroenterology

## 2012-08-03 NOTE — Telephone Encounter (Signed)
Patient will come see Mike Gip PA on 08/09/12 1:30

## 2012-08-03 NOTE — Telephone Encounter (Signed)
Can you please help me to find a good spot for this hospital f/u.  Thank you.

## 2012-08-08 ENCOUNTER — Inpatient Hospital Stay (HOSPITAL_COMMUNITY)
Admission: EM | Admit: 2012-08-08 | Discharge: 2012-08-10 | DRG: 982 | Disposition: A | Discharge status: Home / Self Care | Payer: Medicare Other | Attending: Internal Medicine | Admitting: Internal Medicine

## 2012-08-08 ENCOUNTER — Inpatient Hospital Stay (HOSPITAL_COMMUNITY): Payer: Medicare Other

## 2012-08-08 ENCOUNTER — Encounter (HOSPITAL_COMMUNITY): Payer: Self-pay | Admitting: Emergency Medicine

## 2012-08-08 DIAGNOSIS — Z860100 Personal history of colon polyps, unspecified: Secondary | ICD-10-CM

## 2012-08-08 DIAGNOSIS — K5731 Diverticulosis of large intestine without perforation or abscess with bleeding: Principal | ICD-10-CM | POA: Diagnosis present

## 2012-08-08 DIAGNOSIS — Z79899 Other long term (current) drug therapy: Secondary | ICD-10-CM

## 2012-08-08 DIAGNOSIS — K648 Other hemorrhoids: Secondary | ICD-10-CM

## 2012-08-08 DIAGNOSIS — I10 Essential (primary) hypertension: Secondary | ICD-10-CM

## 2012-08-08 DIAGNOSIS — K922 Gastrointestinal hemorrhage, unspecified: Secondary | ICD-10-CM

## 2012-08-08 DIAGNOSIS — F411 Generalized anxiety disorder: Secondary | ICD-10-CM | POA: Diagnosis present

## 2012-08-08 DIAGNOSIS — F3289 Other specified depressive episodes: Secondary | ICD-10-CM | POA: Diagnosis present

## 2012-08-08 DIAGNOSIS — I959 Hypotension, unspecified: Secondary | ICD-10-CM | POA: Diagnosis present

## 2012-08-08 DIAGNOSIS — F329 Major depressive disorder, single episode, unspecified: Secondary | ICD-10-CM | POA: Diagnosis present

## 2012-08-08 DIAGNOSIS — K5730 Diverticulosis of large intestine without perforation or abscess without bleeding: Secondary | ICD-10-CM | POA: Diagnosis present

## 2012-08-08 DIAGNOSIS — K219 Gastro-esophageal reflux disease without esophagitis: Secondary | ICD-10-CM | POA: Diagnosis present

## 2012-08-08 DIAGNOSIS — D509 Iron deficiency anemia, unspecified: Secondary | ICD-10-CM

## 2012-08-08 DIAGNOSIS — M129 Arthropathy, unspecified: Secondary | ICD-10-CM | POA: Diagnosis present

## 2012-08-08 DIAGNOSIS — D62 Acute posthemorrhagic anemia: Secondary | ICD-10-CM | POA: Diagnosis present

## 2012-08-08 LAB — COMPREHENSIVE METABOLIC PANEL
AST: 21 U/L (ref 0–37)
Albumin: 2.6 g/dL — ABNORMAL LOW (ref 3.5–5.2)
Alkaline Phosphatase: 75 U/L (ref 39–117)
Chloride: 101 mEq/L (ref 96–112)
Creatinine, Ser: 0.73 mg/dL (ref 0.50–1.10)
Potassium: 4.2 mEq/L (ref 3.5–5.1)
Total Bilirubin: 0.3 mg/dL (ref 0.3–1.2)

## 2012-08-08 LAB — CBC WITH DIFFERENTIAL/PLATELET
Basophils Absolute: 0 10*3/uL (ref 0.0–0.1)
Basophils Relative: 1 % (ref 0–1)
MCHC: 34.1 g/dL (ref 30.0–36.0)
Neutro Abs: 4.7 10*3/uL (ref 1.7–7.7)
Neutrophils Relative %: 69 % (ref 43–77)
RDW: 15.9 % — ABNORMAL HIGH (ref 11.5–15.5)

## 2012-08-08 LAB — PREPARE RBC (CROSSMATCH)

## 2012-08-08 LAB — OCCULT BLOOD, POC DEVICE: Fecal Occult Bld: POSITIVE

## 2012-08-08 MED ORDER — ACETAMINOPHEN 325 MG PO TABS
650.0000 mg | ORAL_TABLET | Freq: Four times a day (QID) | ORAL | Status: DC | PRN
Start: 2012-08-08 — End: 2012-08-10

## 2012-08-08 MED ORDER — FENTANYL CITRATE 0.05 MG/ML IJ SOLN
INTRAMUSCULAR | Status: DC | PRN
  Administered 2012-08-08: 50 ug via INTRAVENOUS

## 2012-08-08 MED ORDER — MIDAZOLAM HCL 2 MG/2ML IJ SOLN
INTRAMUSCULAR | Status: DC | PRN
  Administered 2012-08-08: 1 mg via INTRAVENOUS

## 2012-08-08 MED ORDER — TECHNETIUM TC 99M-LABELED RED BLOOD CELLS IV KIT
20.0000 | PACK | Freq: Once | INTRAVENOUS | Status: AC | PRN
  Administered 2012-08-08: 20 via INTRAVENOUS

## 2012-08-08 MED ORDER — CALCIUM CARBONATE-VITAMIN D 500-200 MG-UNIT PO TABS
1.0000 | ORAL_TABLET | Freq: Every day | ORAL | Status: DC
  Administered 2012-08-08 – 2012-08-10 (×3): 1 via ORAL
  Filled 2012-08-08 (×3): qty 1

## 2012-08-08 MED ORDER — BISOPROLOL FUMARATE 5 MG PO TABS
5.0000 mg | ORAL_TABLET | Freq: Every day | ORAL | Status: DC
  Administered 2012-08-08 – 2012-08-10 (×3): 5 mg via ORAL
  Filled 2012-08-08 (×3): qty 1

## 2012-08-08 MED ORDER — MIDAZOLAM HCL 2 MG/2ML IJ SOLN
INTRAMUSCULAR | Status: AC
  Filled 2012-08-08: qty 4

## 2012-08-08 MED ORDER — VITAMIN C 500 MG PO TABS
500.0000 mg | ORAL_TABLET | Freq: Every day | ORAL | Status: DC
  Administered 2012-08-09 – 2012-08-10 (×2): 500 mg via ORAL
  Filled 2012-08-08 (×3): qty 1

## 2012-08-08 MED ORDER — ACETAMINOPHEN 650 MG RE SUPP: 650.0000 mg | Freq: Four times a day (QID) | RECTAL | Status: DC | PRN

## 2012-08-08 MED ORDER — SODIUM CHLORIDE 0.9 % IV SOLN
INTRAVENOUS | Status: DC | PRN
  Administered 2012-08-08: 50 mL/h via INTRAVENOUS

## 2012-08-08 MED ORDER — HYDROCODONE-ACETAMINOPHEN 5-325 MG PO TABS: 1.0000 | ORAL_TABLET | ORAL | Status: DC | PRN

## 2012-08-08 MED ORDER — IOHEXOL 300 MG/ML  SOLN
250.0000 mL | Freq: Once | INTRAMUSCULAR | Status: AC | PRN
  Administered 2012-08-08: 100 mL via INTRAVENOUS

## 2012-08-08 MED ORDER — FERROUS SULFATE 325 (65 FE) MG PO TABS
325.0000 mg | ORAL_TABLET | Freq: Every day | ORAL | Status: DC
  Administered 2012-08-09 – 2012-08-10 (×2): 325 mg via ORAL
  Filled 2012-08-08 (×3): qty 1

## 2012-08-08 MED ORDER — FENTANYL CITRATE 0.05 MG/ML IJ SOLN
INTRAMUSCULAR | Status: AC
  Filled 2012-08-08: qty 4

## 2012-08-08 MED ORDER — SODIUM CHLORIDE 0.9 % IJ SOLN
3.0000 mL | Freq: Two times a day (BID) | INTRAMUSCULAR | Status: DC
  Administered 2012-08-08 – 2012-08-10 (×4): 3 mL via INTRAVENOUS

## 2012-08-08 MED ORDER — CITALOPRAM HYDROBROMIDE 20 MG PO TABS
20.0000 mg | ORAL_TABLET | Freq: Every day | ORAL | Status: DC
  Administered 2012-08-08 – 2012-08-10 (×3): 20 mg via ORAL
  Filled 2012-08-08 (×3): qty 1

## 2012-08-08 MED ORDER — PANTOPRAZOLE SODIUM 40 MG PO TBEC
40.0000 mg | DELAYED_RELEASE_TABLET | Freq: Every day | ORAL | Status: DC
  Administered 2012-08-08 – 2012-08-10 (×3): 40 mg via ORAL
  Filled 2012-08-08 (×3): qty 1

## 2012-08-08 MED ORDER — WHITE PETROLATUM GEL
Status: AC
  Administered 2012-08-08: 16:00:00
  Filled 2012-08-08: qty 5

## 2012-08-08 NOTE — Consult Note (Signed)
Reason for Consult:  Reccurent GI bleed Referring Physician: Onalee Hua Tat  Susan Henry is an 78 y.o. female.  ZOX:WRUE a history of GI bleed that was recently discharged from [facility] on 08/02/2012. At that time the patient had a GI bleed and was evaluated by both general surgery; as well as gastroenterology, Dr. [name]. The patient received 6 units of packed red blood cells on the last admission with stabilization of her hemoglobin. A nuclear medicine RBC scan was done 07/30/2012 did not show any active source of bleeding. The patient's hemoglobin remained stable and she was discharged with instructions to come back if she rebled. This morning, the patient noted bright red rectal bleeding when she had a bowel movement. She has had a total of 3 bright red BM's since her admission to the hospital per nursing report. At present she denies any dizziness, chest discomfort, shortness of breath, palpitations, nausea, vomiting, diarrhea, abdominal pain, fevers, chills. She states that she did take one dose of Aleve 2 days ago for a headache. No other NSAIDs. No other new medications except for iron. She has been following dietary guidelines as given her at discharge for her diverticulosis per her husband. At present she does not appear to be in any acute distress. We are asked to see her on consult  for presumed recurrent diverticular bleed and for any recommendations. Current H&H is 7.8/22.9,  Plts are 422,000 She is scheduled to receive two units of PRBC with recheck of cbc post transfusion. Iron,Iron binding, and ferritin studies are pending. GI has been consulted as well.  Past Medical History  Diagnosis Date  . Hypertension   . Reflux   . Aortic sclerosis   . Insomnia   . Colon polyps     Hx of  adenomatous  . Diverticulosis   . Hemorrhoids, internal, with bleeding 01/08/2011  . Arthritis   . GERD (gastroesophageal reflux disease)   . Anemia     Iron deficiency   . Allergy     Seasonal    . Anxiety   . Depression   . Pneumonia   . Blood transfusion     Past Surgical History  Procedure Date  . Cholecystectomy   . Upper gastrointestinal endoscopy   . Low anterior bowel resection 1996    diverticulitis  . Colonoscopy 01/2011  . Colonoscopy 11/18/2011    Procedure: COLONOSCOPY;  Surgeon: [name], MD;  Location: [facility];  Service: Endoscopy;  Laterality: N/A;  . Esophagogastroduodenoscopy 11/18/2011    Procedure: ESOPHAGOGASTRODUODENOSCOPY (EGD);  Surgeon: [name], MD;  Location: [facility];  Service: Endoscopy;  Laterality: N/A;  . Colonoscopy 11/24/2011    Procedure: COLONOSCOPY;  Surgeon: Erick Blinks, MD;  Location: [facility];  Service: Gastroenterology;  Laterality: N/A;  . Colon surgery     Family History  Problem Relation Age of Onset  . Colon cancer Neg Hx   . Anesthesia problems Neg Hx   . Hypotension Neg Hx   . Malignant hyperthermia Neg Hx   . Pseudochol deficiency Neg Hx   . Pancreatic cancer Father     Social History:  reports that she has never smoked. She has never used smokeless tobacco. She reports that she does not drink alcohol or use illicit drugs.  Allergies:  Allergies  Allergen Reactions  . Alprazolam Rash    Medications: I have reviewed the patient's current medications.  Results for orders placed during the hospital encounter of 08/08/12 (from the past 48 hour(s))  TYPE  AND SCREEN     Status: Normal (Preliminary result)   Collection Time   08/08/12 10:50 AM      Component Value Range Comment   ABO/RH(D) A POS      Antibody Screen NEG      Sample Expiration 08/11/2012      Unit Number [id number]      Blood Component Type RED CELLS,LR      Unit division 00      Status of Unit ALLOCATED      Transfusion Status OK TO TRANSFUSE      Crossmatch Result Compatible      Unit Number [id number]      Blood Component Type RED CELLS,LR      Unit division 00      Status of Unit ALLOCATED      Transfusion Status OK  TO TRANSFUSE      Crossmatch Result Compatible      Unit Number [id number]      Blood Component Type RED CELLS,LR      Unit division 00      Status of Unit ALLOCATED      Transfusion Status OK TO TRANSFUSE      Crossmatch Result Compatible      Unit Number [id number]      Blood Component Type RED CELLS,LR      Unit division 00      Status of Unit ALLOCATED      Transfusion Status OK TO TRANSFUSE      Crossmatch Result Compatible     CBC WITH DIFFERENTIAL     Status: Abnormal   Collection Time   08/08/12 11:15 AM      Component Value Range Comment   WBC 6.8  4.0 - 10.5 K/uL    RBC 2.44 (*) 3.87 - 5.11 MIL/uL    Hemoglobin 7.8 (*) 12.0 - 15.0 g/dL    HCT 72.5 (*) 36.6 - 46.0 %    MCV 93.9  78.0 - 100.0 fL    MCH 32.0  26.0 - 34.0 pg    MCHC 34.1  30.0 - 36.0 g/dL    RDW 44.0 (*) 34.7 - 15.5 %    Platelets 422 (*) 150 - 400 K/uL    Neutrophils Relative 69  43 - 77 %    Neutro Abs 4.7  1.7 - 7.7 K/uL    Lymphocytes Relative 22  12 - 46 %    Lymphs Abs 1.5  0.7 - 4.0 K/uL    Monocytes Relative 5  3 - 12 %    Monocytes Absolute 0.4  0.1 - 1.0 K/uL    Eosinophils Relative 3  0 - 5 %    Eosinophils Absolute 0.2  0.0 - 0.7 K/uL    Basophils Relative 1  0 - 1 %    Basophils Absolute 0.0  0.0 - 0.1 K/uL   COMPREHENSIVE METABOLIC PANEL     Status: Abnormal   Collection Time   08/08/12 11:15 AM      Component Value Range Comment   Sodium 135  135 - 145 mEq/L    Potassium 4.2  3.5 - 5.1 mEq/L    Chloride 101  96 - 112 mEq/L    CO2 28  19 - 32 mEq/L    Glucose, Bld 114 (*) 70 - 99 mg/dL    BUN 12  6 - 23 mg/dL    Creatinine, Ser 4.25  0.50 - 1.10 mg/dL    Calcium 8.2 (*) 8.4 -  10.5 mg/dL    Total Protein 5.7 (*) 6.0 - 8.3 g/dL    Albumin 2.6 (*) 3.5 - 5.2 g/dL    AST 21  0 - 37 U/L    ALT 16  0 - 35 U/L    Alkaline Phosphatase 75  39 - 117 U/L    Total Bilirubin 0.3  0.3 - 1.2 mg/dL    GFR calc non Af Amer 76 (*) >90 mL/min    GFR calc Af Amer 88 (*) >90 mL/min     PREPARE RBC (CROSSMATCH)     Status: Normal   Collection Time   08/08/12 12:48 PM      Component Value Range Comment   Order Confirmation ORDER PROCESSED BY BLOOD BANK     OCCULT BLOOD, POC DEVICE     Status: Normal   Collection Time   08/08/12  2:22 PM      Component Value Range Comment   Fecal Occult Bld POSITIVE       No results found.  Review of Systems  Constitutional: Positive for malaise/fatigue. Negative for fever, chills, weight loss and diaphoresis.  HENT: Negative.   Eyes: Negative.   Respiratory: Negative.   Cardiovascular: Negative.   Gastrointestinal: Positive for blood in stool. Negative for melena.  Genitourinary: Negative.   Musculoskeletal: Negative.   Skin: Negative.   Neurological: Positive for weakness.  Endo/Heme/Allergies: Negative.   Psychiatric/Behavioral: Negative.    Blood pressure 147/67, pulse 65, temperature 97.6 F (36.4 C), temperature source Oral, resp. rate 19, SpO2 98.00%. Physical Exam  Constitutional: She is oriented to person, place, and time. She appears well-developed and well-nourished. No distress.  HENT:  Head: Normocephalic and atraumatic.  Mouth/Throat: No oropharyngeal exudate.  Eyes: Conjunctivae normal and EOM are normal. Pupils are equal, round, and reactive to light. Right eye exhibits no discharge. Left eye exhibits no discharge. No scleral icterus.  Neck: Normal range of motion. Neck supple. No JVD present. No tracheal deviation present. No thyromegaly present.  Cardiovascular: Normal rate, regular rhythm, normal heart sounds and intact distal pulses.  Exam reveals no gallop and no friction rub.   No murmur heard. Respiratory: Effort normal and breath sounds normal. No stridor. No respiratory distress. She has no wheezes. She has no rales. She exhibits no tenderness.  GI: Soft. Bowel sounds are normal. She exhibits no distension and no mass. There is no tenderness. There is no rebound and no guarding.  Musculoskeletal:  Normal range of motion. She exhibits no edema and no tenderness.  Lymphadenopathy:    She has no cervical adenopathy.  Neurological: She is alert and oriented to person, place, and time.  Skin: Skin is warm and dry. No rash noted. She is not diaphoretic. No erythema. No pallor.  Psychiatric: She has a normal mood and affect.    Assessment/Plan:  Patient Active Problem List  Diagnosis  . Hemorrhoids, internal, with bleeding  . Iron deficiency anemia, unspecified  . Hypertension  . GI hemorrhage  . Diverticulosis of colon (without mention of hemorrhage)  . Hypotension  Presumed recurrent diverticular bleed.  Plan: Agree with transfuse two units PRBC  per hospital medicine team for Hbg of 7.9 If she requires emergency operation it will be likely require a subtotal colectomy. Given her history of  diverticuli throughout her colon.  Perhaps the bleeding can be localized which would be preferable to the above course of action. At present she appears clinically stable; and does not appear to need emergency surgery right now.  Will await Labs, and follow clinical picture. Recommend repeating a tagged scan followed by angiogram as first step to localize, if GI thinks this is best way to proceed. Management of her other medical issues per hospital medicine team. GI's input is appreciated as well.   Thank you for this consult we will continue to follow along with you.  Kennon Encinas 08/08/2012, 3:22 PM

## 2012-08-08 NOTE — Consult Note (Signed)
[facility] Consult: 1:45 PM 08/08/2012   Referring Provider: Dr Onalee Hua Tat.  Primary Care Physician:  [name], MD Primary Gastroenterologist:  Dr. Leone Payor   Reason for Consultation:  Recurrent painless hematochezia.   HPI: Susan Henry is a 78 y.o. female known to Dr. Leone Payor with history of recurrent diverticular bleeding. History is also positive for chronic GERD, hypertension, aortic sclerosis, and adenomatous colon polyps.  Patient underwent endoscopy in January of 2013 and also had 2 colonoscopies during that admission all done for GI bleeding. First colonoscopy was done 11/18/2011 showing small amount of blood throughout the colon backwashing into the terminal ileum. There was no culprit diverticulum identified. Repeat colonoscopy was done 11/24/2011, no blood in her colon at that time she did have a small diverticulum noted in the terminal ileum and severe diverticulosis from the ascending colon to the sigmoid colon.  Patient was admitted 07/29/12 - 08/02/12 with recurrent diverticular bleed.  Nuclear bleeding scan of 10/13 was negative. Surgery followed pt but did not pursue surgery as bleeding resolved. Transfused total of 6 units. She would require a subtotal colectomy given pan colonic diverticulosis per Dr Doreen Salvage note of 10/12.   Now returns with recurrent hematochezia. One episode about 7 AM today, second episode in ED at about 2:10 PM.  No nausea, abdominal pain, dizziness.  Hgb is 7.8.  Compared to 7.9 on 07/31/12 at last admission and 8.1 to 8.5 on discharge date 10/16.   She's not on any regular aspirin.  Took one Aleve 2 days ago for a headache.      Past Medical History  Diagnosis Date  . Hypertension   . Reflux   . Aortic sclerosis   . Insomnia   . Colon polyps     Hx of  adenomatous  . Diverticulosis   . Hemorrhoids, internal, with bleeding 01/08/2011  . Arthritis   . GERD (gastroesophageal reflux  disease)   . Anemia     Iron deficiency   . Allergy     Seasonal  . Anxiety   . Depression   . Pneumonia   . Blood transfusion     Past Surgical History  Procedure Date  . Cholecystectomy   . Upper gastrointestinal endoscopy   . Low anterior bowel resection 1996    diverticulitis  . Colonoscopy 01/2011  . Colonoscopy 11/18/2011    Procedure: COLONOSCOPY;  Surgeon: [name], MD;  Location: [facility];  Service: Endoscopy;  Laterality: N/A;  . Esophagogastroduodenoscopy 11/18/2011    Procedure: ESOPHAGOGASTRODUODENOSCOPY (EGD);  Surgeon: [name], MD;  Location: [facility];  Service: Endoscopy;  Laterality: N/A;  . Colonoscopy 11/24/2011    Procedure: COLONOSCOPY;  Surgeon: Erick Blinks, MD;  Location: [facility];  Service: Gastroenterology;  Laterality: N/A;  . Colon surgery        Endometrial biopsy  10/2011:  Atrophic  endometrium no hyperplasia or malignancy.    Prior to Admission medications   Medication Sig Start Date End Date Taking? Authorizing Provider  amLODipine (NORVASC) 5 MG tablet Take 5 mg by mouth daily.     Yes Historical Provider, MD  bisoprolol (ZEBETA) 5 MG tablet Take 5 mg by mouth daily.    Yes Historical Provider, MD  calcium-vitamin D (OSCAL WITH D) 500-200 MG-UNIT per tablet Take 1 tablet by mouth daily.   Yes Historical Provider, MD  citalopram (CELEXA) 20 MG tablet Take 20 mg by mouth daily.   Yes Historical Provider, MD  ferrous sulfate 325 (65 FE) MG tablet  Take 325 mg by mouth daily with breakfast.   Yes Historical Provider, MD  furosemide (LASIX) 20 MG tablet Take 20 mg by mouth daily.     Yes Historical Provider, MD  lisinopril (PRINIVIL,ZESTRIL) 20 MG tablet Take 20 mg by mouth daily.     Yes Historical Provider, MD  RABEprazole (ACIPHEX) 20 MG tablet Take 20 mg by mouth daily.     Yes Historical Provider, MD  vitamin C (ASCORBIC ACID) 500 MG tablet Take 500 mg by mouth daily.   Yes Historical Provider, MD    Scheduled Meds:     Infusions:    PRN Meds:      Allergies as of 08/08/2012 - Review Complete 08/08/2012  Allergen Reaction Noted  . Alprazolam Rash 01/08/2011    Family History  Problem Relation Age of Onset  . Colon cancer Neg Hx   . Anesthesia problems Neg Hx   . Hypotension Neg Hx   . Malignant hyperthermia Neg Hx   . Pseudochol deficiency Neg Hx   . Pancreatic cancer Father     History   Social History  . Marital Status: Married    Spouse Name: N/A    Number of Children: N/A  . Years of Education: N/A   Occupational History  . Unemployed      Homemaker   Social History Main Topics  . Smoking status: Never Smoker   . Smokeless tobacco: Never Used  . Alcohol Use: 3.5 oz/week    7 drink(s) per week  . Drug Use: No  . Sexually Active: Not on file   Other Topics Concern  . Not on file   Social History Narrative  . No narrative on file     PHYSICAL EXAM: Vital signs in last 24 hours: Temp:  [98 F (36.7 C)] 98 F (36.7 C) (10/22 0947) Pulse Rate:  [63-66] 64  (10/22 1245) Resp:  [17-23] 23  (10/22 1245) BP: (107-131)/(50-80) 129/57 mmHg (10/22 1245) SpO2:  [96 %-99 %] 98 % (10/22 1245)   LAB RESULTS:  Basename 08/08/12 1115  WBC 6.8  HGB 7.8*  HCT 22.9*  PLT 422*  MCV           93   PT/INR No results found for this basename: LABPROT:2,INR:2 in the last 72 hours Hepatitis Panel No results found for this basename: HEPBSAG,HCVAB,HEPAIGM,HEPBIGM in the last 72 hours  Studies/Results: No results found.   LOS: 0 days   Lina Sar  08/08/2012,   Assessment:  I have reviewed the chart, examined the pt and spoke to Dr Jamey Ripa, surgeon on call and DR Tat as well as DR A.Henn Radiologist. Pt has a large volume GI bleeding, likely diverticular  last BM about 1 hour ago. She is stable at the moment. We will proceed with Nuclear Medicine scan ( was negative on 10/13), if positive then she will have an mesenteric arteriogram in IR. The surgeons will be on  stand-by. She will be transferred to STEP down unit so she can be watched closely especially if she is to have an arteriogram.. I have discussed the plan with the pt and her daughter.  Jennye Moccasin  08/08/2012, 1:45 PM Pager: [phone]

## 2012-08-08 NOTE — Procedures (Signed)
Mesenteric angiogram showed active extravasation from asc colon branch off SMA Successful microcoil subselective embolization with cessation of bleeding. No complication No blood loss. See complete dictation in [facility].

## 2012-08-08 NOTE — Progress Notes (Signed)
Positive NM scan in the right colon resulted  In mesenteric angiogram with hemostasis of a bleeding vessel, branch of SMA. Will observe for further bleeding.

## 2012-08-08 NOTE — H&P (Addendum)
[facility] History and Physical  CAELEN HIGINBOTHAM WUJ:[id number] DOB: 11/02/1924 DOA: 08/08/2012   PCP: [name], MD   Chief Complaint: GI bleed  HPI:  78 year old female with a history of GI bleed that was recently discharged from [facility] on 08/02/2012. At that time the patient had a GI bleed and was evaluated by general surgery as well as gastroenterology, Dr. [name]. The patient received 6 units of packed red blood cells on the last admission with stabilization of her hemoglobin. A nuclear medicine RBC scan was done 07/30/2012 which did not show any active source of bleeding. The patient's hemoglobin remained stable and she was discharged with instructions to come back if she rebled. This morning, the patient noted bright red rectal bleeding when she had a bowel movement. The patient denies any dizziness, chest discomfort, shortness of breath, palpitations, nausea, vomiting, diarrhea, abdominal pain, fevers, chills. She states that she did take one dose of Aleve 2 days ago for a headache. No other NSAIDs. No other new medications except for iron. Assessment/Plan: GI bleed -Nuclear medicine RBC scan 07/30/2012 was negative for active source of bleeding -Colonoscopy on 11/18/2011 suspected diverticular bleed -Patient is hemodynamically stable, asymptomatic at this time -Check PT/PTT -Place on telemetry -Iron studies -Reticulocyte count -I have already consulted [facility]--->they wanted to consult surgery whom I have already contacted -check serial H/H q 6hours -clear liquid diet for now Hypertension -Continue beta blocker -Although not hypotensive, patient's systolic blood pressure remains between 100-115--> hold patient's lisinopril, amlodipine, Lasix diverticulosis Iron Deficiency anemia -Iron studies ordered -Continue oral iron      Past Medical History  Diagnosis Date  . Hypertension   . Reflux   . Aortic sclerosis   . Insomnia   . Colon  polyps     Hx of  adenomatous  . Diverticulosis   . Hemorrhoids, internal, with bleeding 01/08/2011  . Arthritis   . GERD (gastroesophageal reflux disease)   . Anemia     Iron deficiency   . Allergy     Seasonal  . Anxiety   . Depression   . Pneumonia   . Blood transfusion    Past Surgical History  Procedure Date  . Cholecystectomy   . Upper gastrointestinal endoscopy   . Low anterior bowel resection 1996    diverticulitis  . Colonoscopy 01/2011  . Colonoscopy 11/18/2011    Procedure: COLONOSCOPY;  Surgeon: [name], MD;  Location: [facility];  Service: Endoscopy;  Laterality: N/A;  . Esophagogastroduodenoscopy 11/18/2011    Procedure: ESOPHAGOGASTRODUODENOSCOPY (EGD);  Surgeon: [name], MD;  Location: [facility];  Service: Endoscopy;  Laterality: N/A;  . Colonoscopy 11/24/2011    Procedure: COLONOSCOPY;  Surgeon: Erick Blinks, MD;  Location: [facility];  Service: Gastroenterology;  Laterality: N/A;  . Colon surgery    Social History:  reports that she has never smoked. She has never used smokeless tobacco. She reports that she drinks about 3.5 ounces of alcohol per week. She reports that she does not use illicit drugs.   Family History  Problem Relation Age of Onset  . Colon cancer Neg Hx   . Anesthesia problems Neg Hx   . Hypotension Neg Hx   . Malignant hyperthermia Neg Hx   . Pseudochol deficiency Neg Hx   . Pancreatic cancer Father      Allergies  Allergen Reactions  . Alprazolam Rash      Prior to Admission medications   Medication Sig Start Date End Date Taking? Authorizing  Provider  amLODipine (NORVASC) 5 MG tablet Take 5 mg by mouth daily.     Yes Historical Provider, MD  bisoprolol (ZEBETA) 5 MG tablet Take 5 mg by mouth daily.    Yes Historical Provider, MD  calcium-vitamin D (OSCAL WITH D) 500-200 MG-UNIT per tablet Take 1 tablet by mouth daily.   Yes Historical Provider, MD  citalopram (CELEXA) 20 MG tablet Take 20 mg by mouth daily.   Yes  Historical Provider, MD  ferrous sulfate 325 (65 FE) MG tablet Take 325 mg by mouth daily with breakfast.   Yes Historical Provider, MD  furosemide (LASIX) 20 MG tablet Take 20 mg by mouth daily.     Yes Historical Provider, MD  lisinopril (PRINIVIL,ZESTRIL) 20 MG tablet Take 20 mg by mouth daily.     Yes Historical Provider, MD  RABEprazole (ACIPHEX) 20 MG tablet Take 20 mg by mouth daily.     Yes Historical Provider, MD  vitamin C (ASCORBIC ACID) 500 MG tablet Take 500 mg by mouth daily.   Yes Historical Provider, MD    Review of Systems:  Constitutional:  No weight loss, night sweats, Fevers, chills, fatigue.  Head&Eyes: No headache.  No vision loss.  No eye pain or scotoma ENT:  No Difficulty swallowing,Tooth/dental problems,Sore throat,  No ear ache, post nasal drip,  Cardio-vascular:  No chest pain, Orthopnea, PND, swelling in lower extremities,  dizziness, palpitations  GI:  No  abdominal pain, nausea, vomiting, diarrhea, loss of appetite, hematochezia, melena, heartburn, indigestion, Resp:  No shortness of breath with exertion or at rest. No cough. No coughing up of blood .No wheezing.No chest wall deformity  Skin:  no rash or lesions.  GU:  no dysuria, change in color of urine, no urgency or frequency. No flank pain.  Musculoskeletal:  No joint pain or swelling. No decreased range of motion. No back pain.  Psych:  No change in mood or affect. No depression or anxiety. Neurologic: No headache, no dysesthesia, no focal weakness, no vision loss. No syncope  Physical Exam: Filed Vitals:   08/08/12 1030 08/08/12 1115 08/08/12 1200 08/08/12 1245  BP: 107/50 124/80 131/56 129/57  Pulse: 63 63 63 64  Temp:      TempSrc:      Resp: 18 17 17 23   SpO2: 97% 99% 98% 98%   General:  A&O x 3, NAD, nontoxic, pleasant/cooperative Head/Eye: No conjunctival hemorrhage, no icterus, [place]/AT, No nystagmus ENT:  No icterus,  No thrush, good dentition, no pharyngeal exudate Neck:  No  masses, no lymphadenpathy, no bruits CV:  RRR, no rub, no gallop, no S3 Lung:  CTAB, good air movement, no wheeze, no rhonchi Abdomen: soft/NT, +BS, nondistended, no peritoneal signs Ext: No cyanosis, No rashes, No petechiae, No lymphangitis, 1+ edema Neuro: CNII-XII intact, strength 4/5 in bilateral upper and lower extremitie Metabolic Panel:  Lab 08/08/12 1610  NA 135  K 4.2  CL 101  CO2 28  GLUCOSE 114*  BUN 12  CREATININE 0.73  CALCIUM 8.2*  MG --  PHOS --   Liver Function Tests:  Lab 08/08/12 1115  AST 21  ALT 16  ALKPHOS 75  BILITOT 0.3  PROT 5.7*  ALBUMIN 2.6*   No results found for this basename: LIPASE:5,AMYLASE:5 in the last 168 hours No results found for this basename: AMMONIA:5 in the last 168 hours CBC:  Lab 08/08/12 1115 08/02/12 0918 08/02/12 0600  WBC 6.8 -- 6.8  NEUTROABS 4.7 -- --  HGB 7.8* 8.5*  8.1*  HCT 22.9* 24.9* 23.4*  MCV 93.9 -- 89.0  PLT 422* -- 197   Cardiac Enzymes: No results found for this basename: CKTOTAL:5,CKMB:5,CKMBINDEX:5,TROPONINI:5 in the last 168 hours BNP: No components found with this basename: POCBNP:5 CBG: No results found for this basename: GLUCAP:5 in the last 168 hours  Radiological Exams on Admission: No results found.     Time spent:70 minutes Code Status:   full Family Communication:   Family at bedside   Roston Grunewald, DO  [facility] Pager [phone]  If 7PM-7AM, please contact night-coverage [email or web address] Password [email or web address] 08/08/2012, 1:37 PM

## 2012-08-08 NOTE — Consult Note (Signed)
Agree with A&P of JD,NP.  She just had another BRB bm. No abd pain and appears comfortable.   She is in process of getting to stepdown and trying to get a bleeding scan. If she continues to bleed and no source identified she may need subtotal colectomy.

## 2012-08-08 NOTE — ED Notes (Signed)
Pt given ice chips

## 2012-08-08 NOTE — Progress Notes (Signed)
Susan Henry [id number] Admission Data: 08/08/2012 4:46 PM Attending Provider: Catarina Hartshorn, MD  XBM:[id number],GMWNU RUTH, MD Consults/ Treatment Team: Treatment Team:  Hart Carwin, MD Md Ccs, MD  Susan Henry is a 78 y.o. female patient admitted from ED awake, alert  & orientated  X 3,  Prior, VSS - Blood pressure 116/66, pulse 62, temperature 98.3 F (36.8 C), temperature source Oral, resp. rate 17, height 5\' 5"  (1.651 m), weight 71 kg (156 lb 8.4 oz), SpO2 95.00%.,  no c/o shortness of breath, no c/o chest pain, no distress noted. Tele # [id number]  placed and pt is currently running:normal sinus rhythm.   IV site WDL:  hand left, condition patent and no redness and antecubital left, condition patent and no redness with a transparent dsg that's clean dry and intact.  Allergies:   Allergies  Allergen Reactions  . Alprazolam Rash     Past Medical History  Diagnosis Date  . Hypertension   . Reflux   . Aortic sclerosis   . Insomnia   . Colon polyps     Hx of  adenomatous  . Diverticulosis   . Hemorrhoids, internal, with bleeding 01/08/2011  . Arthritis   . GERD (gastroesophageal reflux disease)   . Anemia     Iron deficiency   . Allergy     Seasonal  . Anxiety   . Depression   . Pneumonia   . Blood transfusion     History:  obtained from the patient. Tobacco/alcohol: denied none  Pt orientation to unit, room and routine. Information packet given to patient/family and safety video watched.  Admission INP armband ID verified with patient/family, and in place. SR up x 2, fall risk assessment complete with Patient and family verbalizing understanding of risks associated with falls. Pt verbalizes an understanding of how to use the call bell and to call for help before getting out of bed.  Skin, clean-dry- intact without evidence of bruising, or skin tears.   No evidence of skin break down noted on exam. no rashes, no ecchymoses, no petechiae, no wounds    Will cont to  monitor and assist as needed.  Susan Henry, Gretta Cool, RN 08/08/2012 4:46 PM

## 2012-08-08 NOTE — ED Notes (Signed)
Pt assisted onto bedpan.

## 2012-08-08 NOTE — ED Provider Notes (Signed)
History     CSN: [id number]  Arrival date & time 08/08/12  4782   First MD Initiated Contact with Patient 08/08/12 1047      Chief Complaint  Patient presents with  . GI Problem    (Consider location/radiation/quality/duration/timing/severity/associated sxs/prior treatment) HPI Comments: Susan Henry presents via EMS from home. She has had 3 bloody bowel movements this morning. She was discharged from the hospital one week ago after being admitted for evaluation of gastrointestinal bleeding. She received multiple transfusions during that hospitalization. She reports this is her third episode of GI bleeding. She denies any recent surgeries and states she is not taking any blood thinners currently. She denies having any pain and states she otherwise feels well or fine.  Patient is a 78 y.o. female presenting with hematochezia. The history is provided by the patient. No language interpreter was used.  Rectal Bleeding  The current episode started today. The problem has been unchanged. The patient is experiencing no pain. The stool is described as bloody. Pertinent negatives include no anorexia, no fever, no abdominal pain, no diarrhea, no hematemesis, no hemorrhoids, no nausea, no rectal pain, no vomiting, no hematuria, no chest pain, no coughing, no difficulty breathing and no rash. She has been behaving normally. Her past medical history does not include inflammatory bowel disease or recent abdominal injury. Past medical history comments: Previous GI bleeding.    Past Medical History  Diagnosis Date  . Hypertension   . Reflux   . Aortic sclerosis   . Insomnia   . Colon polyps     Hx of  adenomatous  . Diverticulosis   . Hemorrhoids, internal, with bleeding 01/08/2011  . Arthritis   . GERD (gastroesophageal reflux disease)   . Anemia     Iron deficiency   . Allergy     Seasonal  . Anxiety   . Depression   . Pneumonia   . Blood transfusion     Past Surgical History  Procedure  Date  . Cholecystectomy   . Upper gastrointestinal endoscopy   . Low anterior bowel resection 1996    diverticulitis  . Colonoscopy 01/2011  . Colonoscopy 11/18/2011    Procedure: COLONOSCOPY;  Surgeon: [name], MD;  Location: [facility];  Service: Endoscopy;  Laterality: N/A;  . Esophagogastroduodenoscopy 11/18/2011    Procedure: ESOPHAGOGASTRODUODENOSCOPY (EGD);  Surgeon: [name], MD;  Location: [facility];  Service: Endoscopy;  Laterality: N/A;  . Colonoscopy 11/24/2011    Procedure: COLONOSCOPY;  Surgeon: Erick Blinks, MD;  Location: [facility];  Service: Gastroenterology;  Laterality: N/A;  . Colon surgery     Family History  Problem Relation Age of Onset  . Colon cancer Neg Hx   . Anesthesia problems Neg Hx   . Hypotension Neg Hx   . Malignant hyperthermia Neg Hx   . Pseudochol deficiency Neg Hx   . Pancreatic cancer Father     History  Substance Use Topics  . Smoking status: Never Smoker   . Smokeless tobacco: Never Used  . Alcohol Use: 3.5 oz/week    7 drink(s) per week    OB History    Grav Para Term Preterm Abortions TAB SAB Ect Mult Living                  Review of Systems  Constitutional: Negative for fever, chills, diaphoresis, activity change, appetite change and fatigue.  HENT: Negative.   Eyes: Negative.   Respiratory: Negative for cough, chest tightness, shortness of breath (she  also denies exertional dyspnea.) and wheezing.   Cardiovascular: Negative for chest pain, palpitations and leg swelling.  Gastrointestinal: Positive for blood in stool and hematochezia. Negative for nausea, vomiting, abdominal pain, diarrhea, constipation, rectal pain, anorexia, hematemesis and hemorrhoids.  Genitourinary: Negative for dysuria, frequency, hematuria, flank pain, decreased urine volume and difficulty urinating.  Musculoskeletal: Negative for back pain.  Skin: Negative for color change, pallor, rash and wound.  Neurological: Negative for dizziness,  weakness and light-headedness.  Hematological: Does not bruise/bleed easily.  Psychiatric/Behavioral: Negative.     Allergies  Alprazolam  Home Medications   Current Outpatient Rx  Name Route Sig Dispense Refill  . AMLODIPINE BESYLATE 5 MG PO TABS Oral Take 5 mg by mouth daily.      Marland Kitchen BISOPROLOL FUMARATE 5 MG PO TABS Oral Take 5 mg by mouth daily.     Marland Kitchen CALCIUM CARBONATE-VITAMIN D 500-200 MG-UNIT PO TABS Oral Take 1 tablet by mouth daily.    Marland Kitchen CITALOPRAM HYDROBROMIDE 20 MG PO TABS Oral Take 20 mg by mouth daily.    Marland Kitchen FERROUS SULFATE 325 (65 FE) MG PO TABS Oral Take 325 mg by mouth daily with breakfast.    . FUROSEMIDE 20 MG PO TABS Oral Take 20 mg by mouth daily.      Marland Kitchen LISINOPRIL 20 MG PO TABS Oral Take 20 mg by mouth daily.      Marland Kitchen RABEPRAZOLE SODIUM 20 MG PO TBEC Oral Take 20 mg by mouth daily.      Marland Kitchen VITAMIN C 500 MG PO TABS Oral Take 500 mg by mouth daily.      BP 126/57  Pulse 66  Temp 98 F (36.7 C) (Oral)  Resp 18  SpO2 96%  Physical Exam  Nursing note and vitals reviewed. Constitutional: She is oriented to person, place, and time. She appears well-developed and well-nourished. No distress.  HENT:  Head: Normocephalic and atraumatic.  Right Ear: External ear normal.  Left Ear: External ear normal.  Nose: Nose normal.  Mouth/Throat: Oropharynx is clear and moist. No oropharyngeal exudate.  Eyes: EOM are normal. Pupils are equal, round, and reactive to light. Right eye exhibits no discharge. Left eye exhibits no discharge. No scleral icterus.       Pale conjunctiva  Neck: Normal range of motion. Neck supple. No JVD present. No tracheal deviation present.  Cardiovascular: Normal rate, regular rhythm and intact distal pulses.  Exam reveals no gallop and no friction rub.   Murmur heard.  Systolic murmur is present with a grade of 2/6  Pulmonary/Chest: No stridor.  Abdominal: Soft. Bowel sounds are normal. She exhibits no distension and no mass. There is no tenderness.  There is no rebound and no guarding.  Musculoskeletal: She exhibits no edema and no tenderness.  Lymphadenopathy:    She has no cervical adenopathy.  Neurological: She is alert and oriented to person, place, and time. No cranial nerve deficit.  Skin: Skin is warm and dry. No rash noted. She is not diaphoretic. No pallor.  Psychiatric: She has a normal mood and affect. Her behavior is normal.    ED Course  Procedures (including critical care time)  Labs Reviewed  CBC WITH DIFFERENTIAL - Abnormal; Notable for the following:    RBC 2.44 (*)     Hemoglobin 7.8 (*)     HCT 22.9 (*)     RDW 15.9 (*)     Platelets 422 (*)     All other components within normal limits  TYPE  AND SCREEN  COMPREHENSIVE METABOLIC PANEL   No results found.   No diagnosis found.    MDM  Patient presents for evaluation of GI bleeding. She has a history of previous GI bleeds and was discharged from the hospital just one week ago after receiving blood transfusion secondary to a GI bleed. She is currently awake and oriented, denies pain, no distress observed. Will obtain CBC, basic metabolic panel, coags, and type and screen. If she is noted to have a significant decrease in her hemoglobin and hematocrit, will move towards transfusion and admission.   1420.  Pt's H&H has declined significantly since last week.  She has also had 1 large, frankly bloody BM here in the ER.  She has been crossmatched and will receive 2-4 units PRBCs.  Discussed her presentation with the on-call hospitalist provider.  Plan admit for further mgmnt.  GI will also be follow-ing and contributing to her care.  She remains hemodynamically stable even after the BP here.  Initiated a 500 ml IVF bolus while awaiting blood products.  CRITICAL CARE Performed by: Dana Allan T   Total critical care time: 30  Critical care time was exclusive of separately billable procedures and treating other patients.  Critical care was necessary to  treat or prevent imminent or life-threatening deterioration.  Critical care was time spent personally by me on the following activities: development of treatment plan with patient and/or surrogate as well as nursing, discussions with consultants, evaluation of patient's response to treatment, examination of patient, obtaining history from patient or surrogate, ordering and performing treatments and interventions, ordering and review of laboratory studies, ordering and review of radiographic studies, pulse oximetry and re-evaluation of patient's condition.        Susan Chad, MD 08/08/12 1426

## 2012-08-08 NOTE — Progress Notes (Signed)
Clinical Social Work Department BRIEF PSYCHOSOCIAL ASSESSMENT 08/08/2012  Patient:  Boch,Mylee C     Account Number:  [id number]     Admit date:  08/08/2012  Clinical Social Worker:  Dennison Bulla  Date/Time:  08/08/2012 04:00 PM  Referred by:  RN  Date Referred:  08/08/2012 Referred for  Advanced Directives   Other Referral:   Interview type:  Patient Other interview type:    PSYCHOSOCIAL DATA Living Status:  FAMILY Admitted from facility:   Level of care:   Primary support name:  Molly Maduro Primary support relationship to patient:  CHILD, ADULT Degree of support available:   Strong    CURRENT CONCERNS Current Concerns  Other - See comment   Other Concerns:   Advanced directives    SOCIAL WORK ASSESSMENT / PLAN CSW received referral to assist with advanced directives. CSW reviewed chart and met with patient, dtr and son at bedside. Patient agreeable to family involvement in assessment.    CSW introduced myself and explained role. CSW provided patient with packet and explained HCPOA and living will. CSW allowed patient and family time to ask questions. Patient reports that she wants to review packet before completing information. CSW left CSW contact information and explained that packet needed to be notarized in order to be effective. CSW also provided community locations that will notarize information. CSW is signing off but available if needed.   Assessment/plan status:  No Further Intervention Required Other assessment/ plan:   Information/referral to community resources:   Advanced directives packet    PATIENT'S/FAMILY'S RESPONSE TO PLAN OF CARE: Patient alert and oriented. Patient engaged throughout assessment and reports understanding. Patient not ready to complete packet at this time but aware she can contact CSW for assistance if needed.

## 2012-08-08 NOTE — ED Notes (Signed)
To Ed from home via EMS, pt with recent admit for GI bleeding, reports BRB per rectum X3 this AM, VSS per EMS, denies pain, no N/V, NAD

## 2012-08-09 ENCOUNTER — Ambulatory Visit: Payer: Medicare Other | Admitting: Physician Assistant

## 2012-08-09 DIAGNOSIS — K922 Gastrointestinal hemorrhage, unspecified: Secondary | ICD-10-CM

## 2012-08-09 DIAGNOSIS — I10 Essential (primary) hypertension: Secondary | ICD-10-CM

## 2012-08-09 DIAGNOSIS — K5730 Diverticulosis of large intestine without perforation or abscess without bleeding: Secondary | ICD-10-CM

## 2012-08-09 DIAGNOSIS — D62 Acute posthemorrhagic anemia: Secondary | ICD-10-CM | POA: Diagnosis present

## 2012-08-09 LAB — CBC
Hemoglobin: 8.4 g/dL — ABNORMAL LOW (ref 12.0–15.0)
Hemoglobin: 8.9 g/dL — ABNORMAL LOW (ref 12.0–15.0)
MCH: 30.1 pg (ref 26.0–34.0)
MCHC: 34.1 g/dL (ref 30.0–36.0)
RBC: 2.79 MIL/uL — ABNORMAL LOW (ref 3.87–5.11)
RDW: 17.5 % — ABNORMAL HIGH (ref 11.5–15.5)

## 2012-08-09 LAB — BASIC METABOLIC PANEL
CO2: 27 mEq/L (ref 19–32)
GFR calc non Af Amer: 75 mL/min — ABNORMAL LOW (ref >90)
Glucose, Bld: 97 mg/dL (ref 70–99)
Potassium: 3.9 mEq/L (ref 3.5–5.1)
Sodium: 136 mEq/L (ref 135–145)

## 2012-08-09 LAB — MRSA PCR SCREENING: MRSA by PCR: NEGATIVE

## 2012-08-09 LAB — RETICULOCYTES
RBC.: 2.79 MIL/uL — ABNORMAL LOW (ref 3.87–5.11)
Retic Count, Absolute: 120 10*3/uL (ref 19.0–186.0)

## 2012-08-09 LAB — PROTIME-INR: INR: 1.07 (ref 0.00–1.49)

## 2012-08-09 LAB — IRON AND TIBC
Saturation Ratios: 36 % (ref 20–55)
UIBC: 168 ug/dL (ref 125–400)

## 2012-08-09 MED ORDER — OXYCODONE HCL 5 MG PO TABS: 5.0000 mg | ORAL_TABLET | ORAL | Status: DC | PRN

## 2012-08-09 MED ORDER — ZOLPIDEM TARTRATE 5 MG PO TABS
5.0000 mg | ORAL_TABLET | Freq: Every evening | ORAL | Status: DC | PRN
  Administered 2012-08-09: 5 mg via ORAL
  Filled 2012-08-09: qty 1

## 2012-08-09 NOTE — Progress Notes (Signed)
Pt had medium bloody stool, dark red.  Vital signs stable.  2nd unit of PRBC still infusing.  Will continue to monitor.    Maximino Greenland RN

## 2012-08-09 NOTE — Progress Notes (Signed)
     Susan Henry Daily Rounding Note 08/09/2012, 8:45 AM  SUBJECTIVE:       Note succesful coiling of SMA branch bleeder in ascending colon.  S/p 2 units PRBCs.  Last stools were bloody, yesterday evening.  Feels well.  Tolerating clears.  Not dizzy or weak.    OBJECTIVE:         Vital signs in last 24 hours:    Temp:  [97.4 F (36.3 C)-98.4 F (36.9 C)] 98.2 F (36.8 C) (10/23 0800) Pulse Rate:  [57-71] 60  (10/23 0800) Resp:  [14-23] 15  (10/23 0800) BP: (83-167)/(33-93) 133/63 mmHg (10/23 0800) SpO2:  [91 %-100 %] 91 % (10/23 0800) Weight:  [70.3 kg (154 lb 15.7 oz)-71 kg (156 lb 8.4 oz)] 70.3 kg (154 lb 15.7 oz) (10/22 2300) Last BM Date: 08/08/12 General: looks well   Heart: RRR Chest: clear B.  No cough or resp distress Abdomen: soft, NT, ND.  No HSM, no mass.  Extremities: no pedal edema Neuro/Psych:  Moves all 4s, no gross deficits.  Oriented x 3.    Lab Results:  [facility] 08/09/12 0450 08/08/12 1115  WBC 7.3 6.8  HGB 8.4* 7.8*  HCT 25.1* 22.9*  PLT 347 422*   BMET  Basename 08/09/12 0450 08/08/12 1115  NA 136 135  K 3.9 4.2  CL 103 101  CO2 27 28  GLUCOSE 97 114*  BUN 14 12  CREATININE 0.77 0.73  CALCIUM 8.3* 8.2*   LFT  Basename 08/08/12 1115  PROT 5.7*  ALBUMIN 2.6*  AST 21  ALT 16  ALKPHOS 75  BILITOT 0.3  BILIDIR --  IBILI --   PT/INR  Basename 08/09/12 0450  LABPROT 13.8  INR 1.07    Studies/Results: Nm Henry Blood Loss 08/08/2012  *RADIOLOGY REPORT*  Clinical Data: 78 year old with Henry bleed.  NUCLEAR MEDICINE GASTROINTESTINAL BLEEDING STUDY  Technique:  Sequential abdominal images were obtained following intravenous administration of Tc-51m labeled red blood cells.  Radiopharmaceutical: CURIE ULTRATAG TECHNETIUM TC 54M- LABELED RED BLOOD CELLS IV KIT  Comparison: 07/30/2012  Findings: There is expected uptake within the abdominal vascular structures and the liver.  There is accumulation of the radiopharmaceutical within the right  abdomen.  The location would be most compatible with the hepatic flexure and right side of the transverse colon.  IMPRESSION: Positive bleeding study.  The site of bleeding is in the right abdomen and near the hepatic flexure of the colon.   Original Report Authenticated By: Richarda Overlie, M.D.     ASSESMENT: *  Diverticular bleed, recurrent.  S/p coil embolization of ascending colon branch SMA vessel 08/08/12 *  ABL anemia.  Transfused with 2 PRBCs in last 18 hours. H&H improved.    PLAN: *  Carb mod diet. *  Ambulate pt *  Check CBC this afternoon and in AM.    LOS: 1 day   Jennye Moccasin  08/09/2012, 8:45 AM Pager: [phone]   I have reviewed the above note, examined the patient and agree with plan of treatment.Stable hemodynamically, follow CBC,surgery on standby  [facility] Pager # [phone]

## 2012-08-09 NOTE — Progress Notes (Signed)
[facility] Progress Note [place] TEAM 1 - Stepdown/ICU TEAM   Susan Henry NFA:[id number] DOB: [birth year] DOA: 08/08/2012 PCP: [name], MD  Brief narrative: 78 year old female with a history of GI bleed that was recently discharged from [facility] on 08/02/2012. At that time the patient had a GI bleed and was evaluated by general surgery as well as gastroenterology, Dr. [name]. The patient received 6 units of packed red blood cells on the last admission with stabilization of her hemoglobin. A nuclear medicine RBC scan was done 07/30/2012 which did not show any active source of bleeding. The patient's hemoglobin remained stable and she was discharged with instructions to come back if she rebled. The morning of her re-admit, the patient noted bright red rectal bleeding when she had a bowel movement. The patient denied any dizziness, chest discomfort, shortness of breath, palpitations, nausea, vomiting, diarrhea, abdominal pain, fevers, chills. She stated that she did take one dose of Aleve 2 days earlier for a headache. No other NSAIDs. No other new medications except for iron.  Assessment/Plan:  Recurrent painless BRBPR / GI Bleeding - likey diverticular Nuclear medicine RBC scan 07/30/2012 was negative for active source of bleeding - followup scan this admission confirmed right colon active bleeding - patient was taken for arteriogram and underwent some selective embolization per interventional radiology  Fe deficiency anemia due to acute blood loss S/p 2U PRBC this admit - Hgb is climbing at this time - will need to cont to follow as pt has continued to pass some bloody stools  HTN Recently controlled at the present time - we'll not adjust treatment regimen in the setting of acute bleeding  Diverticulosis Probable source of bleeding - I have warned the patient of bleeding from other sources could occur in the future and advised her to seek help immediately  should this occur  Aortic sclerosis Murmur appreciable on exam - patient is hemodynamically stable  Code Status: FULL Disposition Plan: Remain in step down unit  Consultants: [facility]  Procedures: 10/22 - NM bleeding scan - active bleeding in R colon 10/22 - mesenteric arteriogram w/ microcoil subselective embolization branch of SMA  Antibiotics: None  DVT prophylaxis: SCDs  HPI/Subjective: Patient is alert and quite pleasant.  She denies having had a bowel movement thus far today.  She denies chest pain fevers chills nausea or vomiting.  She denies dizziness or headache.   Objective: Blood pressure 156/66, pulse 66, temperature 98 F (36.7 C), temperature source Oral, resp. rate 12, height 5\' 5"  (1.651 m), weight 70.3 kg (154 lb 15.7 oz), SpO2 96.00%.  Intake/Output Summary (Last 24 hours) at 08/09/12 1427 Last data filed at 08/09/12 0900  Gross per 24 hour  Intake 1273.75 ml  Output      0 ml  Net 1273.75 ml     Exam: General: No acute respiratory distress Lungs: Clear to auscultation bilaterally without wheezes or crackles Cardiovascular: Regular rate and rhythm with 2/6 holosystolic murmur without appreciable gallop or rub Abdomen: Nontender, nondistended, soft, bowel sounds positive, no rebound, no ascites, no appreciable mass Extremities: No significant cyanosis, clubbing, or edema bilateral lower extremities  Data Reviewed: Basic Metabolic Panel:  Lab 08/09/12 4696 08/08/12 1115  NA 136 135  K 3.9 4.2  CL 103 101  CO2 27 28  GLUCOSE 97 114*  BUN 14 12  CREATININE 0.77 0.73  CALCIUM 8.3* 8.2*  MG -- --  PHOS -- --   Liver Function Tests:  Lab 08/08/12 1115  AST 21  ALT 16  ALKPHOS 75  BILITOT 0.3  PROT 5.7*  ALBUMIN 2.6*   CBC:  Lab 08/09/12 0450 08/08/12 1115  WBC 7.3 6.8  NEUTROABS -- 4.7  HGB 8.4* 7.8*  HCT 25.1* 22.9*  MCV 90.0 93.9  PLT 347 422*    Recent Results (from the past 240 hour(s))  MRSA PCR SCREENING      Status: Normal   Collection Time   08/08/12 11:03 PM      Component Value Range Status Comment   MRSA by PCR NEGATIVE  NEGATIVE Final      Studies:  Recent x-ray studies have been reviewed in detail by the Attending Physician  Scheduled Meds:  Reviewed in detail by the Attending Physician   Lonia Blood, MD [facility] Office  [phone] Pager [phone]  On-Call/Text Page:      [email or web address]      password [email or web address]  If 7PM-7AM, please contact night-coverage [email or web address] Password [email or web address] 08/09/2012, 2:27 PM   LOS: 1 day

## 2012-08-09 NOTE — Progress Notes (Signed)
Pt arrived from IR via bed.  Vital signs stable, CHG bath done, no apparent distress, no complaints.  Daughter at bedside.  Groin incision soft, no drainage, level 0.   Pt aware of bedrest and activity limitations.  Will continue to monitor.  Maximino Greenland RN

## 2012-08-09 NOTE — Progress Notes (Signed)
Subjective: Doing well this am, reports 3 total BM post embolization of SMA branch bleeder last evening.  No abdominal pain or other c/o. Tolerating diet.  Objective: Vital signs in last 24 hours: Temp:  [97.4 F (36.3 C)-98.4 F (36.9 C)] 98.2 F (36.8 C) (10/23 0800) Pulse Rate:  [57-71] 60  (10/23 0800) Resp:  [14-23] 15  (10/23 0800) BP: (83-167)/(33-93) 133/63 mmHg (10/23 0800) SpO2:  [91 %-100 %] 91 % (10/23 0800) Weight:  [154 lb 15.7 oz (70.3 kg)-156 lb 8.4 oz (71 kg)] 154 lb 15.7 oz (70.3 kg) (10/22 2300) Last BM Date: 08/09/12  Intake/Output from previous day: 10/22 0701 - 10/23 0700 In: 793.8 [I.V.:300; Blood:493.8] Out: -  Intake/Output this shift: Total I/O In: 480 [P.O.:480] Out: -   General appearance: alert, cooperative, appears stated age and no distress Chest: CTA bilaterally Cardiac: RRR, No M/R/G Abdomen: soft, non-tender, reported 3 BM post embolization (likely represents clearing of old blood vs new bleed as a global scan was done and no further bleeds were identified).  Extremities: Warm to touch, + pulses bilaterally no edema VSS, H&H stable, afebrile.  Lab Results:   [facility] 08/09/12 0450 08/08/12 1115  WBC 7.3 6.8  HGB 8.4* 7.8*  HCT 25.1* 22.9*  PLT 347 422*   BMET  Basename 08/09/12 0450 08/08/12 1115  NA 136 135  K 3.9 4.2  CL 103 101  CO2 27 28  GLUCOSE 97 114*  BUN 14 12  CREATININE 0.77 0.73  CALCIUM 8.3* 8.2*   PT/INR  Basename 08/09/12 0450  LABPROT 13.8  INR 1.07   ABG No results found for this basename: PHART:2,PCO2:2,PO2:2,HCO3:2 in the last 72 hours  Studies/Results: Nm Gi Blood Loss  08/08/2012  *RADIOLOGY REPORT*  Clinical Data: 78 year old with GI bleed.  NUCLEAR MEDICINE GASTROINTESTINAL BLEEDING STUDY  Technique:  Sequential abdominal images were obtained following intravenous administration of Tc-54m labeled red blood cells.  Radiopharmaceutical: CURIE ULTRATAG TECHNETIUM TC 58M- LABELED RED  BLOOD CELLS IV KIT  Comparison: 07/30/2012  Findings: There is expected uptake within the abdominal vascular structures and the liver.  There is accumulation of the radiopharmaceutical within the right abdomen.  The location would be most compatible with the hepatic flexure and right side of the transverse colon.  IMPRESSION: Positive bleeding study.  The site of bleeding is in the right abdomen and near the hepatic flexure of the colon.   Original Report Authenticated By: Richarda Overlie, M.D.    Ir Angiogram Visceral Selective  08/09/2012  *RADIOLOGY REPORT*  Clinical Data: Recurrent lower GI bleed, with recent positive tagged red blood cell scintigraphy.  SELECTIVE VISCERAL ARTERIOGRAPHY,IR ULTRASOUND GUIDANCE VASC ACCESS RIGHT,ARTERIOGRAPHY,ADDITIONAL ARTERIOGRAPHY,TRANSCATHETER THERAPY EMBOLIZATION  Comparison: Scintigraphy from earlier the same day  Technique and findings: The procedure, risks (including but not limited to bleeding, infection, organ damage), benefits, and alternatives were explained to the patient.  Questions regarding the procedure were encouraged and answered.  The patient understands and consents to the procedure.Right femoral region prepped and draped in the usual sterile fashion. Maximal barrier sterile technique was utilized including caps, mask, sterile gowns, sterile gloves, sterile drape, hand hygiene and skin antiseptic.  Intravenous Fentanyl and Versed were administered as conscious sedation during continuous cardiorespiratory monitoring by the radiology RN, with a total moderate sedation time of 35 minutes.  Under real time ultrasound guidance, the right common femoral artery was accessed with a 21-gauge micropuncture needle in a single pass using single-wall technique. Ultrasound documentation was stored.  The needle  was exchanged over a .018 wire for a transitional dilator, which allowed passage of a Benson wire into the abdominal aorta.  Over this, a 6-French vascular sheath was  placed, through which a 5-French pigtail catheter was advanced for flush aortography.  The aortogram shows minimal atheromatous irregularity of the infrarenal abdominal aorta without stenosis or aneurysm.  Celiac axis, superior mesenteric artery, and bilateral renal arteries widely patent.  Inferior mesenteric artery is patent.  Minimal plaque at the aortic bifurcation without stenosis.  Active extravasation in the right lower quadrant was noted.  The pigtail was exchanged for a 5-French C2 catheter, used to selectively catheterize the superior mesenteric artery for selective arteriography.  This confirmed   a focal area of active extravasation in the proximal/mid ascending colon, fed by SMA branches.  A Renegade microcatheter with angled guide wire was advanced coaxially used to selectively catheterize the feeding branch.  After confirmatory arteriography, a 2 mm x 3 cm Interlock coil was deployed in the peripheral   supplying branch to the active bleed.  Follow-up selective arteriography shows no further extravasation or other supply to the bleeding region.  Confirmatory superior mesenteric arteriography demonstrates no further extravasation, and no other complication from the procedure.  After confirmatory femoral arteriography, the sheath was removed and hemostasis achieved with the Exoseal device. The patient tolerated the procedure well.  No immediate complication.  IMPRESSION: 1.  Active extravasation in the proximal/mid ascending colon, corresponding to the findings on recent scintigraphy. 2.  Technically successful subselective coil embolization with cessation of active extravasation.   Original Report Authenticated By: Osa Craver, M.D.    Ir Angiogram Selective Each Additional Vessel  08/09/2012  *RADIOLOGY REPORT*  Clinical Data: Recurrent lower GI bleed, with recent positive tagged red blood cell scintigraphy.  SELECTIVE VISCERAL ARTERIOGRAPHY,IR ULTRASOUND GUIDANCE VASC ACCESS  RIGHT,ARTERIOGRAPHY,ADDITIONAL ARTERIOGRAPHY,TRANSCATHETER THERAPY EMBOLIZATION  Comparison: Scintigraphy from earlier the same day  Technique and findings: The procedure, risks (including but not limited to bleeding, infection, organ damage), benefits, and alternatives were explained to the patient.  Questions regarding the procedure were encouraged and answered.  The patient understands and consents to the procedure.Right femoral region prepped and draped in the usual sterile fashion. Maximal barrier sterile technique was utilized including caps, mask, sterile gowns, sterile gloves, sterile drape, hand hygiene and skin antiseptic.  Intravenous Fentanyl and Versed were administered as conscious sedation during continuous cardiorespiratory monitoring by the radiology RN, with a total moderate sedation time of 35 minutes.  Under real time ultrasound guidance, the right common femoral artery was accessed with a 21-gauge micropuncture needle in a single pass using single-wall technique. Ultrasound documentation was stored.  The needle was exchanged over a .018 wire for a transitional dilator, which allowed passage of a Benson wire into the abdominal aorta.  Over this, a 6-French vascular sheath was placed, through which a 5-French pigtail catheter was advanced for flush aortography.  The aortogram shows minimal atheromatous irregularity of the infrarenal abdominal aorta without stenosis or aneurysm.  Celiac axis, superior mesenteric artery, and bilateral renal arteries widely patent.  Inferior mesenteric artery is patent.  Minimal plaque at the aortic bifurcation without stenosis.  Active extravasation in the right lower quadrant was noted.  The pigtail was exchanged for a 5-French C2 catheter, used to selectively catheterize the superior mesenteric artery for selective arteriography.  This confirmed   a focal area of active extravasation in the proximal/mid ascending colon, fed by SMA branches.  A Renegade  microcatheter with angled  guide wire was advanced coaxially used to selectively catheterize the feeding branch.  After confirmatory arteriography, a 2 mm x 3 cm Interlock coil was deployed in the peripheral   supplying branch to the active bleed.  Follow-up selective arteriography shows no further extravasation or other supply to the bleeding region.  Confirmatory superior mesenteric arteriography demonstrates no further extravasation, and no other complication from the procedure.  After confirmatory femoral arteriography, the sheath was removed and hemostasis achieved with the Exoseal device. The patient tolerated the procedure well.  No immediate complication.  IMPRESSION: 1.  Active extravasation in the proximal/mid ascending colon, corresponding to the findings on recent scintigraphy. 2.  Technically successful subselective coil embolization with cessation of active extravasation.   Original Report Authenticated By: Osa Craver, M.D.    Ir Transcath/emboliz  08/09/2012  *RADIOLOGY REPORT*  Clinical Data: Recurrent lower GI bleed, with recent positive tagged red blood cell scintigraphy.  SELECTIVE VISCERAL ARTERIOGRAPHY,IR ULTRASOUND GUIDANCE VASC ACCESS RIGHT,ARTERIOGRAPHY,ADDITIONAL ARTERIOGRAPHY,TRANSCATHETER THERAPY EMBOLIZATION  Comparison: Scintigraphy from earlier the same day  Technique and findings: The procedure, risks (including but not limited to bleeding, infection, organ damage), benefits, and alternatives were explained to the patient.  Questions regarding the procedure were encouraged and answered.  The patient understands and consents to the procedure.Right femoral region prepped and draped in the usual sterile fashion. Maximal barrier sterile technique was utilized including caps, mask, sterile gowns, sterile gloves, sterile drape, hand hygiene and skin antiseptic.  Intravenous Fentanyl and Versed were administered as conscious sedation during continuous cardiorespiratory  monitoring by the radiology RN, with a total moderate sedation time of 35 minutes.  Under real time ultrasound guidance, the right common femoral artery was accessed with a 21-gauge micropuncture needle in a single pass using single-wall technique. Ultrasound documentation was stored.  The needle was exchanged over a .018 wire for a transitional dilator, which allowed passage of a Benson wire into the abdominal aorta.  Over this, a 6-French vascular sheath was placed, through which a 5-French pigtail catheter was advanced for flush aortography.  The aortogram shows minimal atheromatous irregularity of the infrarenal abdominal aorta without stenosis or aneurysm.  Celiac axis, superior mesenteric artery, and bilateral renal arteries widely patent.  Inferior mesenteric artery is patent.  Minimal plaque at the aortic bifurcation without stenosis.  Active extravasation in the right lower quadrant was noted.  The pigtail was exchanged for a 5-French C2 catheter, used to selectively catheterize the superior mesenteric artery for selective arteriography.  This confirmed   a focal area of active extravasation in the proximal/mid ascending colon, fed by SMA branches.  A Renegade microcatheter with angled guide wire was advanced coaxially used to selectively catheterize the feeding branch.  After confirmatory arteriography, a 2 mm x 3 cm Interlock coil was deployed in the peripheral   supplying branch to the active bleed.  Follow-up selective arteriography shows no further extravasation or other supply to the bleeding region.  Confirmatory superior mesenteric arteriography demonstrates no further extravasation, and no other complication from the procedure.  After confirmatory femoral arteriography, the sheath was removed and hemostasis achieved with the Exoseal device. The patient tolerated the procedure well.  No immediate complication.  IMPRESSION: 1.  Active extravasation in the proximal/mid ascending colon, corresponding  to the findings on recent scintigraphy. 2.  Technically successful subselective coil embolization with cessation of active extravasation.   Original Report Authenticated By: Osa Craver, M.D.    Ir Angiogram Follow Up Study  08/09/2012  *  RADIOLOGY REPORT*  Clinical Data: Recurrent lower GI bleed, with recent positive tagged red blood cell scintigraphy.  SELECTIVE VISCERAL ARTERIOGRAPHY,IR ULTRASOUND GUIDANCE VASC ACCESS RIGHT,ARTERIOGRAPHY,ADDITIONAL ARTERIOGRAPHY,TRANSCATHETER THERAPY EMBOLIZATION  Comparison: Scintigraphy from earlier the same day  Technique and findings: The procedure, risks (including but not limited to bleeding, infection, organ damage), benefits, and alternatives were explained to the patient.  Questions regarding the procedure were encouraged and answered.  The patient understands and consents to the procedure.Right femoral region prepped and draped in the usual sterile fashion. Maximal barrier sterile technique was utilized including caps, mask, sterile gowns, sterile gloves, sterile drape, hand hygiene and skin antiseptic.  Intravenous Fentanyl and Versed were administered as conscious sedation during continuous cardiorespiratory monitoring by the radiology RN, with a total moderate sedation time of 35 minutes.  Under real time ultrasound guidance, the right common femoral artery was accessed with a 21-gauge micropuncture needle in a single pass using single-wall technique. Ultrasound documentation was stored.  The needle was exchanged over a .018 wire for a transitional dilator, which allowed passage of a Benson wire into the abdominal aorta.  Over this, a 6-French vascular sheath was placed, through which a 5-French pigtail catheter was advanced for flush aortography.  The aortogram shows minimal atheromatous irregularity of the infrarenal abdominal aorta without stenosis or aneurysm.  Celiac axis, superior mesenteric artery, and bilateral renal arteries widely patent.   Inferior mesenteric artery is patent.  Minimal plaque at the aortic bifurcation without stenosis.  Active extravasation in the right lower quadrant was noted.  The pigtail was exchanged for a 5-French C2 catheter, used to selectively catheterize the superior mesenteric artery for selective arteriography.  This confirmed   a focal area of active extravasation in the proximal/mid ascending colon, fed by SMA branches.  A Renegade microcatheter with angled guide wire was advanced coaxially used to selectively catheterize the feeding branch.  After confirmatory arteriography, a 2 mm x 3 cm Interlock coil was deployed in the peripheral   supplying branch to the active bleed.  Follow-up selective arteriography shows no further extravasation or other supply to the bleeding region.  Confirmatory superior mesenteric arteriography demonstrates no further extravasation, and no other complication from the procedure.  After confirmatory femoral arteriography, the sheath was removed and hemostasis achieved with the Exoseal device. The patient tolerated the procedure well.  No immediate complication.  IMPRESSION: 1.  Active extravasation in the proximal/mid ascending colon, corresponding to the findings on recent scintigraphy. 2.  Technically successful subselective coil embolization with cessation of active extravasation.   Original Report Authenticated By: Osa Craver, M.D.    Ir US Guide Vasc Access Right  08/09/2012  *RADIOLOGY REPORT*  Clinical Data: Recurrent lower GI bleed, with recent positive tagged red blood cell scintigraphy.  SELECTIVE VISCERAL ARTERIOGRAPHY,IR ULTRASOUND GUIDANCE VASC ACCESS RIGHT,ARTERIOGRAPHY,ADDITIONAL ARTERIOGRAPHY,TRANSCATHETER THERAPY EMBOLIZATION  Comparison: Scintigraphy from earlier the same day  Technique and findings: The procedure, risks (including but not limited to bleeding, infection, organ damage), benefits, and alternatives were explained to the patient.  Questions  regarding the procedure were encouraged and answered.  The patient understands and consents to the procedure.Right femoral region prepped and draped in the usual sterile fashion. Maximal barrier sterile technique was utilized including caps, mask, sterile gowns, sterile gloves, sterile drape, hand hygiene and skin antiseptic.  Intravenous Fentanyl and Versed were administered as conscious sedation during continuous cardiorespiratory monitoring by the radiology RN, with a total moderate sedation time of 35 minutes.  Under real time ultrasound guidance,  the right common femoral artery was accessed with a 21-gauge micropuncture needle in a single pass using single-wall technique. Ultrasound documentation was stored.  The needle was exchanged over a .018 wire for a transitional dilator, which allowed passage of a Benson wire into the abdominal aorta.  Over this, a 6-French vascular sheath was placed, through which a 5-French pigtail catheter was advanced for flush aortography.  The aortogram shows minimal atheromatous irregularity of the infrarenal abdominal aorta without stenosis or aneurysm.  Celiac axis, superior mesenteric artery, and bilateral renal arteries widely patent.  Inferior mesenteric artery is patent.  Minimal plaque at the aortic bifurcation without stenosis.  Active extravasation in the right lower quadrant was noted.  The pigtail was exchanged for a 5-French C2 catheter, used to selectively catheterize the superior mesenteric artery for selective arteriography.  This confirmed   a focal area of active extravasation in the proximal/mid ascending colon, fed by SMA branches.  A Renegade microcatheter with angled guide wire was advanced coaxially used to selectively catheterize the feeding branch.  After confirmatory arteriography, a 2 mm x 3 cm Interlock coil was deployed in the peripheral   supplying branch to the active bleed.  Follow-up selective arteriography shows no further extravasation or other  supply to the bleeding region.  Confirmatory superior mesenteric arteriography demonstrates no further extravasation, and no other complication from the procedure.  After confirmatory femoral arteriography, the sheath was removed and hemostasis achieved with the Exoseal device. The patient tolerated the procedure well.  No immediate complication.  IMPRESSION: 1.  Active extravasation in the proximal/mid ascending colon, corresponding to the findings on recent scintigraphy. 2.  Technically successful subselective coil embolization with cessation of active extravasation.   Original Report Authenticated By: Osa Craver, M.D.     Anti-infectives: Anti-infectives    None      Assessment/Plan: Patient Active Problem List  Diagnosis  . Hemorrhoids, internal, with bleeding  . Iron deficiency anemia, unspecified  . Hypertension  . GI hemorrhage  . Diverticulosis of colon (without mention of hemorrhage)  . Hypotension  s/p * No surgery found * S/P  embolization of SMA branch bleeder 08/08/12 Continue to monitor labs and clinical picture Management of her other medical issues per Hospitalist medicine team GI's assistance is much appreciated as well. ? Transfer to medicine floor.   LOS: 1 day    Susan Henry 08/09/2012

## 2012-08-09 NOTE — Progress Notes (Signed)
See note of JD,NP. Since he was by has had two more BM's with blood. Patient without abd c/o - cramping earlier  VS:BP 156/66  Pulse 66  Temp 98 F (36.7 C) (Oral)  Resp 12  Ht 5\' 5"  (1.651 m)  Wt 154 lb 15.7 oz (70.3 kg)  BMI 25.79 kg/m2  SpO2 96% Alert nad Abd benign.  Imp GI bleed s/p embolization Plan will recheck cbc now

## 2012-08-09 NOTE — Progress Notes (Signed)
Subjective: Pt doing fair ; had some mid abd cramping earlier today; had dark bloody BM today when attempting to urinate. No nausea/vomiting. Tol liquids ok.  Objective: Vital signs in last 24 hours: Temp:  [97.4 F (36.3 C)-98.4 F (36.9 C)] 98.2 F (36.8 C) (10/23 0800) Pulse Rate:  [57-71] 60  (10/23 0800) Resp:  [14-23] 15  (10/23 0800) BP: (83-167)/(33-93) 133/63 mmHg (10/23 0800) SpO2:  [91 %-100 %] 91 % (10/23 0800) Weight:  [154 lb 15.7 oz (70.3 kg)-156 lb 8.4 oz (71 kg)] 154 lb 15.7 oz (70.3 kg) (10/22 2300) Last BM Date: 08/09/12  Intake/Output from previous day: 10/22 0701 - 10/23 0700 In: 793.8 [I.V.:300; Blood:493.8] Out: -  Intake/Output this shift: Total I/O In: 480 [P.O.:480] Out: -   Rt CFA puncture site soft, no hematoma , nontender. Distal pulses intact.  Lab Results:   [facility] 08/09/12 0450 08/08/12 1115  WBC 7.3 6.8  HGB 8.4* 7.8*  HCT 25.1* 22.9*  PLT 347 422*   BMET  Basename 08/09/12 0450 08/08/12 1115  NA 136 135  K 3.9 4.2  CL 103 101  CO2 27 28  GLUCOSE 97 114*  BUN 14 12  CREATININE 0.77 0.73  CALCIUM 8.3* 8.2*   PT/INR  Basename 08/09/12 0450  LABPROT 13.8  INR 1.07   ABG No results found for this basename: PHART:2,PCO2:2,PO2:2,HCO3:2 in the last 72 hours  Studies/Results: Nm Gi Blood Loss  08/08/2012  *RADIOLOGY REPORT*  Clinical Data: 78 year old with GI bleed.  NUCLEAR MEDICINE GASTROINTESTINAL BLEEDING STUDY  Technique:  Sequential abdominal images were obtained following intravenous administration of Tc-53m labeled red blood cells.  Radiopharmaceutical: CURIE ULTRATAG TECHNETIUM TC 73M- LABELED RED BLOOD CELLS IV KIT  Comparison: 07/30/2012  Findings: There is expected uptake within the abdominal vascular structures and the liver.  There is accumulation of the radiopharmaceutical within the right abdomen.  The location would be most compatible with the hepatic flexure and right side of the transverse colon.   IMPRESSION: Positive bleeding study.  The site of bleeding is in the right abdomen and near the hepatic flexure of the colon.   Original Report Authenticated By: Richarda Overlie, M.D.    Ir Angiogram Visceral Selective  08/09/2012  *RADIOLOGY REPORT*  Clinical Data: Recurrent lower GI bleed, with recent positive tagged red blood cell scintigraphy.  SELECTIVE VISCERAL ARTERIOGRAPHY,IR ULTRASOUND GUIDANCE VASC ACCESS RIGHT,ARTERIOGRAPHY,ADDITIONAL ARTERIOGRAPHY,TRANSCATHETER THERAPY EMBOLIZATION  Comparison: Scintigraphy from earlier the same day  Technique and findings: The procedure, risks (including but not limited to bleeding, infection, organ damage), benefits, and alternatives were explained to the patient.  Questions regarding the procedure were encouraged and answered.  The patient understands and consents to the procedure.Right femoral region prepped and draped in the usual sterile fashion. Maximal barrier sterile technique was utilized including caps, mask, sterile gowns, sterile gloves, sterile drape, hand hygiene and skin antiseptic.  Intravenous Fentanyl and Versed were administered as conscious sedation during continuous cardiorespiratory monitoring by the radiology RN, with a total moderate sedation time of 35 minutes.  Under real time ultrasound guidance, the right common femoral artery was accessed with a 21-gauge micropuncture needle in a single pass using single-wall technique. Ultrasound documentation was stored.  The needle was exchanged over a .018 wire for a transitional dilator, which allowed passage of a Benson wire into the abdominal aorta.  Over this, a 6-French vascular sheath was placed, through which a 5-French pigtail catheter was advanced for flush aortography.  The aortogram shows minimal atheromatous irregularity  of the infrarenal abdominal aorta without stenosis or aneurysm.  Celiac axis, superior mesenteric artery, and bilateral renal arteries widely patent.  Inferior mesenteric  artery is patent.  Minimal plaque at the aortic bifurcation without stenosis.  Active extravasation in the right lower quadrant was noted.  The pigtail was exchanged for a 5-French C2 catheter, used to selectively catheterize the superior mesenteric artery for selective arteriography.  This confirmed   a focal area of active extravasation in the proximal/mid ascending colon, fed by SMA branches.  A Renegade microcatheter with angled guide wire was advanced coaxially used to selectively catheterize the feeding branch.  After confirmatory arteriography, a 2 mm x 3 cm Interlock coil was deployed in the peripheral   supplying branch to the active bleed.  Follow-up selective arteriography shows no further extravasation or other supply to the bleeding region.  Confirmatory superior mesenteric arteriography demonstrates no further extravasation, and no other complication from the procedure.  After confirmatory femoral arteriography, the sheath was removed and hemostasis achieved with the Exoseal device. The patient tolerated the procedure well.  No immediate complication.  IMPRESSION: 1.  Active extravasation in the proximal/mid ascending colon, corresponding to the findings on recent scintigraphy. 2.  Technically successful subselective coil embolization with cessation of active extravasation.   Original Report Authenticated By: Osa Craver, M.D.    Ir Angiogram Selective Each Additional Vessel  08/09/2012  *RADIOLOGY REPORT*  Clinical Data: Recurrent lower GI bleed, with recent positive tagged red blood cell scintigraphy.  SELECTIVE VISCERAL ARTERIOGRAPHY,IR ULTRASOUND GUIDANCE VASC ACCESS RIGHT,ARTERIOGRAPHY,ADDITIONAL ARTERIOGRAPHY,TRANSCATHETER THERAPY EMBOLIZATION  Comparison: Scintigraphy from earlier the same day  Technique and findings: The procedure, risks (including but not limited to bleeding, infection, organ damage), benefits, and alternatives were explained to the patient.  Questions regarding  the procedure were encouraged and answered.  The patient understands and consents to the procedure.Right femoral region prepped and draped in the usual sterile fashion. Maximal barrier sterile technique was utilized including caps, mask, sterile gowns, sterile gloves, sterile drape, hand hygiene and skin antiseptic.  Intravenous Fentanyl and Versed were administered as conscious sedation during continuous cardiorespiratory monitoring by the radiology RN, with a total moderate sedation time of 35 minutes.  Under real time ultrasound guidance, the right common femoral artery was accessed with a 21-gauge micropuncture needle in a single pass using single-wall technique. Ultrasound documentation was stored.  The needle was exchanged over a .018 wire for a transitional dilator, which allowed passage of a Benson wire into the abdominal aorta.  Over this, a 6-French vascular sheath was placed, through which a 5-French pigtail catheter was advanced for flush aortography.  The aortogram shows minimal atheromatous irregularity of the infrarenal abdominal aorta without stenosis or aneurysm.  Celiac axis, superior mesenteric artery, and bilateral renal arteries widely patent.  Inferior mesenteric artery is patent.  Minimal plaque at the aortic bifurcation without stenosis.  Active extravasation in the right lower quadrant was noted.  The pigtail was exchanged for a 5-French C2 catheter, used to selectively catheterize the superior mesenteric artery for selective arteriography.  This confirmed   a focal area of active extravasation in the proximal/mid ascending colon, fed by SMA branches.  A Renegade microcatheter with angled guide wire was advanced coaxially used to selectively catheterize the feeding branch.  After confirmatory arteriography, a 2 mm x 3 cm Interlock coil was deployed in the peripheral   supplying branch to the active bleed.  Follow-up selective arteriography shows no further extravasation or other supply to  the bleeding region.  Confirmatory superior mesenteric arteriography demonstrates no further extravasation, and no other complication from the procedure.  After confirmatory femoral arteriography, the sheath was removed and hemostasis achieved with the Exoseal device. The patient tolerated the procedure well.  No immediate complication.  IMPRESSION: 1.  Active extravasation in the proximal/mid ascending colon, corresponding to the findings on recent scintigraphy. 2.  Technically successful subselective coil embolization with cessation of active extravasation.   Original Report Authenticated By: Osa Craver, M.D.    Ir Transcath/emboliz  08/09/2012  *RADIOLOGY REPORT*  Clinical Data: Recurrent lower GI bleed, with recent positive tagged red blood cell scintigraphy.  SELECTIVE VISCERAL ARTERIOGRAPHY,IR ULTRASOUND GUIDANCE VASC ACCESS RIGHT,ARTERIOGRAPHY,ADDITIONAL ARTERIOGRAPHY,TRANSCATHETER THERAPY EMBOLIZATION  Comparison: Scintigraphy from earlier the same day  Technique and findings: The procedure, risks (including but not limited to bleeding, infection, organ damage), benefits, and alternatives were explained to the patient.  Questions regarding the procedure were encouraged and answered.  The patient understands and consents to the procedure.Right femoral region prepped and draped in the usual sterile fashion. Maximal barrier sterile technique was utilized including caps, mask, sterile gowns, sterile gloves, sterile drape, hand hygiene and skin antiseptic.  Intravenous Fentanyl and Versed were administered as conscious sedation during continuous cardiorespiratory monitoring by the radiology RN, with a total moderate sedation time of 35 minutes.  Under real time ultrasound guidance, the right common femoral artery was accessed with a 21-gauge micropuncture needle in a single pass using single-wall technique. Ultrasound documentation was stored.  The needle was exchanged over a .018 wire for a  transitional dilator, which allowed passage of a Benson wire into the abdominal aorta.  Over this, a 6-French vascular sheath was placed, through which a 5-French pigtail catheter was advanced for flush aortography.  The aortogram shows minimal atheromatous irregularity of the infrarenal abdominal aorta without stenosis or aneurysm.  Celiac axis, superior mesenteric artery, and bilateral renal arteries widely patent.  Inferior mesenteric artery is patent.  Minimal plaque at the aortic bifurcation without stenosis.  Active extravasation in the right lower quadrant was noted.  The pigtail was exchanged for a 5-French C2 catheter, used to selectively catheterize the superior mesenteric artery for selective arteriography.  This confirmed   a focal area of active extravasation in the proximal/mid ascending colon, fed by SMA branches.  A Renegade microcatheter with angled guide wire was advanced coaxially used to selectively catheterize the feeding branch.  After confirmatory arteriography, a 2 mm x 3 cm Interlock coil was deployed in the peripheral   supplying branch to the active bleed.  Follow-up selective arteriography shows no further extravasation or other supply to the bleeding region.  Confirmatory superior mesenteric arteriography demonstrates no further extravasation, and no other complication from the procedure.  After confirmatory femoral arteriography, the sheath was removed and hemostasis achieved with the Exoseal device. The patient tolerated the procedure well.  No immediate complication.  IMPRESSION: 1.  Active extravasation in the proximal/mid ascending colon, corresponding to the findings on recent scintigraphy. 2.  Technically successful subselective coil embolization with cessation of active extravasation.   Original Report Authenticated By: Osa Craver, M.D.    Ir Angiogram Follow Up Study  08/09/2012  *RADIOLOGY REPORT*  Clinical Data: Recurrent lower GI bleed, with recent positive  tagged red blood cell scintigraphy.  SELECTIVE VISCERAL ARTERIOGRAPHY,IR ULTRASOUND GUIDANCE VASC ACCESS RIGHT,ARTERIOGRAPHY,ADDITIONAL ARTERIOGRAPHY,TRANSCATHETER THERAPY EMBOLIZATION  Comparison: Scintigraphy from earlier the same day  Technique and findings: The procedure, risks (including but not limited to  bleeding, infection, organ damage), benefits, and alternatives were explained to the patient.  Questions regarding the procedure were encouraged and answered.  The patient understands and consents to the procedure.Right femoral region prepped and draped in the usual sterile fashion. Maximal barrier sterile technique was utilized including caps, mask, sterile gowns, sterile gloves, sterile drape, hand hygiene and skin antiseptic.  Intravenous Fentanyl and Versed were administered as conscious sedation during continuous cardiorespiratory monitoring by the radiology RN, with a total moderate sedation time of 35 minutes.  Under real time ultrasound guidance, the right common femoral artery was accessed with a 21-gauge micropuncture needle in a single pass using single-wall technique. Ultrasound documentation was stored.  The needle was exchanged over a .018 wire for a transitional dilator, which allowed passage of a Benson wire into the abdominal aorta.  Over this, a 6-French vascular sheath was placed, through which a 5-French pigtail catheter was advanced for flush aortography.  The aortogram shows minimal atheromatous irregularity of the infrarenal abdominal aorta without stenosis or aneurysm.  Celiac axis, superior mesenteric artery, and bilateral renal arteries widely patent.  Inferior mesenteric artery is patent.  Minimal plaque at the aortic bifurcation without stenosis.  Active extravasation in the right lower quadrant was noted.  The pigtail was exchanged for a 5-French C2 catheter, used to selectively catheterize the superior mesenteric artery for selective arteriography.  This confirmed   a focal area  of active extravasation in the proximal/mid ascending colon, fed by SMA branches.  A Renegade microcatheter with angled guide wire was advanced coaxially used to selectively catheterize the feeding branch.  After confirmatory arteriography, a 2 mm x 3 cm Interlock coil was deployed in the peripheral   supplying branch to the active bleed.  Follow-up selective arteriography shows no further extravasation or other supply to the bleeding region.  Confirmatory superior mesenteric arteriography demonstrates no further extravasation, and no other complication from the procedure.  After confirmatory femoral arteriography, the sheath was removed and hemostasis achieved with the Exoseal device. The patient tolerated the procedure well.  No immediate complication.  IMPRESSION: 1.  Active extravasation in the proximal/mid ascending colon, corresponding to the findings on recent scintigraphy. 2.  Technically successful subselective coil embolization with cessation of active extravasation.   Original Report Authenticated By: Osa Craver, M.D.    Ir US Guide Vasc Access Right  08/09/2012  *RADIOLOGY REPORT*  Clinical Data: Recurrent lower GI bleed, with recent positive tagged red blood cell scintigraphy.  SELECTIVE VISCERAL ARTERIOGRAPHY,IR ULTRASOUND GUIDANCE VASC ACCESS RIGHT,ARTERIOGRAPHY,ADDITIONAL ARTERIOGRAPHY,TRANSCATHETER THERAPY EMBOLIZATION  Comparison: Scintigraphy from earlier the same day  Technique and findings: The procedure, risks (including but not limited to bleeding, infection, organ damage), benefits, and alternatives were explained to the patient.  Questions regarding the procedure were encouraged and answered.  The patient understands and consents to the procedure.Right femoral region prepped and draped in the usual sterile fashion. Maximal barrier sterile technique was utilized including caps, mask, sterile gowns, sterile gloves, sterile drape, hand hygiene and skin antiseptic.  Intravenous  Fentanyl and Versed were administered as conscious sedation during continuous cardiorespiratory monitoring by the radiology RN, with a total moderate sedation time of 35 minutes.  Under real time ultrasound guidance, the right common femoral artery was accessed with a 21-gauge micropuncture needle in a single pass using single-wall technique. Ultrasound documentation was stored.  The needle was exchanged over a .018 wire for a transitional dilator, which allowed passage of a Benson wire into the abdominal aorta.  Over  this, a 6-French vascular sheath was placed, through which a 5-French pigtail catheter was advanced for flush aortography.  The aortogram shows minimal atheromatous irregularity of the infrarenal abdominal aorta without stenosis or aneurysm.  Celiac axis, superior mesenteric artery, and bilateral renal arteries widely patent.  Inferior mesenteric artery is patent.  Minimal plaque at the aortic bifurcation without stenosis.  Active extravasation in the right lower quadrant was noted.  The pigtail was exchanged for a 5-French C2 catheter, used to selectively catheterize the superior mesenteric artery for selective arteriography.  This confirmed   a focal area of active extravasation in the proximal/mid ascending colon, fed by SMA branches.  A Renegade microcatheter with angled guide wire was advanced coaxially used to selectively catheterize the feeding branch.  After confirmatory arteriography, a 2 mm x 3 cm Interlock coil was deployed in the peripheral   supplying branch to the active bleed.  Follow-up selective arteriography shows no further extravasation or other supply to the bleeding region.  Confirmatory superior mesenteric arteriography demonstrates no further extravasation, and no other complication from the procedure.  After confirmatory femoral arteriography, the sheath was removed and hemostasis achieved with the Exoseal device. The patient tolerated the procedure well.  No immediate  complication.  IMPRESSION: 1.  Active extravasation in the proximal/mid ascending colon, corresponding to the findings on recent scintigraphy. 2.  Technically successful subselective coil embolization with cessation of active extravasation.   Original Report Authenticated By: Osa Craver, M.D.     Anti-infectives: Anti-infectives    None      Assessment/Plan: S/p GI bleed with subselective coil embolization of SMA branch bleeder 10/22; monitor labs closely; hydrate;  diet per primary/GI.   LOS: 1 day    Angelyne Terwilliger,D [facility] 08/09/2012

## 2012-08-10 DIAGNOSIS — K922 Gastrointestinal hemorrhage, unspecified: Secondary | ICD-10-CM

## 2012-08-10 DIAGNOSIS — D62 Acute posthemorrhagic anemia: Secondary | ICD-10-CM

## 2012-08-10 LAB — CBC
HCT: 25.1 % — ABNORMAL LOW (ref 36.0–46.0)
Hemoglobin: 8.2 g/dL — ABNORMAL LOW (ref 12.0–15.0)
MCV: 91 fL (ref 78.0–100.0)
Platelets: 337 10*3/uL (ref 150–400)
RBC: 2.66 MIL/uL — ABNORMAL LOW (ref 3.87–5.11)
WBC: 5.9 10*3/uL (ref 4.0–10.5)
WBC: 6.8 10*3/uL (ref 4.0–10.5)

## 2012-08-10 NOTE — Progress Notes (Signed)
Discharge home via w/c with belongings and husband. Discharge instructions explained and discussed to patient and husband, f/u appt with primary dr. Lupita Leash gates  oct 28th at 215pm, discharge home medication discussed as well as the medication to stop till see primary md. Beryle Quant

## 2012-08-10 NOTE — Progress Notes (Signed)
Patient ID: Susan Henry, female   DOB: Mar 02, 1925, 78 y.o.   MRN: [id number]   LOS: 2 days   Subjective: Denies pain, dizziness. States she had non-bloody BM this am.  Objective: Vital signs in last 24 hours: Temp:  [97.5 F (36.4 C)-99.6 F (37.6 C)] 98.3 F (36.8 C) (10/24 0800) Pulse Rate:  [59-106] 63  (10/24 0400) Resp:  [12-21] 21  (10/24 0400) BP: (129-156)/(54-69) 136/60 mmHg (10/24 0800) SpO2:  [92 %-99 %] 97 % (10/24 0400) Last BM Date: 08/09/12  Lab Results:  CBC  Basename 08/10/12 0455 08/09/12 1402  WBC 5.9 7.3  HGB 8.0* 8.9*  HCT 24.2* 26.1*  PLT 337 354   BMET  Basename 08/09/12 0450 08/08/12 1115  NA 136 135  K 3.9 4.2  CL 103 101  CO2 27 28  GLUCOSE 97 114*  BUN 14 12  CREATININE 0.77 0.73  CALCIUM 8.3* 8.2*    General appearance: alert and no distress Resp: clear to auscultation bilaterally Cardio: regular rate and rhythm GI: normal findings: bowel sounds normal and soft, non-tender   Assessment/Plan: GIB s/p embolization ABL anemia -- Down from yesterday along with platelets. For recheck this afternoon. Will follow.   Freeman Caldron, PA-C Pager: [phone]   08/10/2012

## 2012-08-10 NOTE — Progress Notes (Signed)
     [facility] Daily Rounding Note 08/10/2012, 9:17 AM  SUBJECTIVE:       Had dark bloody BM yesterday AM, 08/09/12.  Brown BM this AM.  Tolerating solids.  Not dizzy, not SOB.  No abd pain.   OBJECTIVE:         Vital signs in last 24 hours:    Temp:  [97.5 F (36.4 C)-99.6 F (37.6 C)] 98.3 F (36.8 C) (10/24 0800) Pulse Rate:  [59-106] 63  (10/24 0400) Resp:  [12-21] 21  (10/24 0400) BP: (129-156)/(54-69) 136/60 mmHg (10/24 0800) SpO2:  [92 %-99 %] 97 % (10/24 0400) Last BM Date: 08/09/12 General: looks well   Heart: RRR Chest: clear B, reduced BS overall Abdomen: soft, ND, NT.  No masses.  Active BS  Extremities: no pedal edema Neuro/Psych:  Pleasant, relaxed, appropriate.   Lab Results:  Basename 08/10/12 0455 08/09/12 1402 08/09/12 0450  WBC 5.9 7.3 7.3  HGB 8.0* 8.9* 8.4*  HCT 24.2* 26.1* 25.1*  PLT 337 354 347   BMET  Basename 08/09/12 0450 08/08/12 1115  NA 136 135  K 3.9 4.2  CL 103 101  CO2 27 28  GLUCOSE 97 114*  BUN 14 12  CREATININE 0.77 0.73  CALCIUM 8.3* 8.2*   LFT  Basename 08/08/12 1115  PROT 5.7*  ALBUMIN 2.6*  AST 21  ALT 16  ALKPHOS 75  BILITOT 0.3  BILIDIR --  IBILI --   PT/INR  Basename 08/09/12 0450  LABPROT 13.8  INR 1.07    ASSESMENT: * Diverticular bleed, recurrent. S/p coil embolization of ascending colon branch SMA vessel 08/08/12  * ABL anemia. Transfused with 2 PRBCs within first 18 hours. H&H is down about one gram in last 24 hours. Marland Kitchen    PLAN: *  Recheck CBC early this afternoon, if stable, pt can d/c home today.  If not, keep overnight and recheck CBC in AM.   LOS: 2 days   Jennye Moccasin  08/10/2012, 9:17 AM Pager: [phone]

## 2012-08-10 NOTE — Discharge Summary (Signed)
Physician Discharge Summary  Susan Henry WUJ:[id number] DOB: 1925-05-22 DOA: 08/08/2012  PCP: [name], MD  Admit date: 08/08/2012 Discharge date: 08/10/2012  Time spent: 45 min  Recommendations for Outpatient Follow-up:  1. BP follow up at PCPs office on this coming Monday  Discharge Diagnoses:  Active Problems: Principal Problem:  *Anemia due to acute blood loss Active Problems:  Iron deficiency anemia, unspecified  Diverticulosis of colon (without mention of hemorrhage)  Hypotension  Discharge Condition: stable  Diet recommendation: heart healthy  Filed Weights   08/08/12 1553 08/08/12 2300  Weight: 71 kg (156 lb 8.4 oz) 70.3 kg (154 lb 15.7 oz)    History of present illness:  77 year old female with a history of GI bleed that was recently discharged from [facility] on 08/02/2012. At that time the patient had a GI bleed and was evaluated by general surgery as well as gastroenterology, Dr. [name]. The patient received 6 units of packed red blood cells on the last admission with stabilization of her hemoglobin. A nuclear medicine RBC scan was done 07/30/2012 which did not show any active source of bleeding. The patient's hemoglobin remained stable and she was discharged with instructions to come back if she rebled. The morning of her re-admit, the patient noted bright red rectal bleeding when she had a bowel movement. The patient denied any dizziness, chest discomfort, shortness of breath, palpitations, nausea, vomiting, diarrhea, abdominal pain, fevers, chills. She stated that she did take one dose of Aleve 2 days earlier for a headache. No other NSAIDs. No other new medications except for iron.  Hospital Course:  Recurrent painless BRBPR / GI Bleeding - likey diverticular  Nuclear medicine RBC scan 07/30/2012 was negative for active source of bleeding - followup scan this admission confirmed right colon active bleeding - patient was taken for arteriogram on  10/22 and underwent selective embolization of an SMA branch by interventional radiology   Fe deficiency anemia due to acute blood loss  S/p 2U PRBC this admit - Hgb is stable at 8- she is advised to continue Iron supplements  HTN  Have resumed Bisoprolol only for now- she is advised to f/u with PCP in Monday and have BP Check   Diverticulosis  Probable source of bleeding - I have warned the patient of bleeding from other sources could occur in the future and advised her to seek help immediately should this occur   Aortic sclerosis  Murmur appreciable on exam - patient is hemodynamically stable   Procedures: 10/22 - NM bleeding scan - active bleeding in R colon  10/22 - mesenteric arteriogram w/ microcoil subselective embolization branch of SMA   Consultations: [facility]  Gen Surgery   Discharge Exam: Filed Vitals:   08/10/12 0734 08/10/12 0800 08/10/12 1200 08/10/12 1516  BP:  136/60 126/65 115/47  Pulse:   57 64  Temp: 98 F (36.7 C) 98.3 F (36.8 C) 98.2 F (36.8 C) 97.8 F (36.6 C)  TempSrc: Oral Oral Oral Oral  Resp:   16 15  Height:      Weight:      SpO2:   98% 96%    General: alert, oriented x 3, no acute distress Cardiovascular: RRR, aortic murmur 2/6 Respiratory: CTA b/l   Discharge Instructions      Discharge Orders    Future Orders Please Complete By Expires   Diet - low sodium heart healthy      Increase activity slowly      Discharge instructions  Comments:   See your family doctor on Monday.       Medication List     As of 08/10/2012  4:18 PM    STOP taking these medications         CALCIUM 600 PO      LASIX 20 MG tablet   Generic drug: furosemide      lisinopril 20 MG tablet   Commonly known as: PRINIVIL,ZESTRIL      NORVASC 5 MG tablet   Generic drug: amLODipine      TAKE these medications         ACIPHEX 20 MG tablet   Generic drug: RABEprazole   Take 20 mg by mouth daily.      bisoprolol 5 MG tablet   Commonly  known as: ZEBETA   Take 5 mg by mouth daily.      calcium-vitamin D 500-200 MG-UNIT per tablet   Commonly known as: OSCAL WITH D   Take 1 tablet by mouth daily.      citalopram 20 MG tablet   Commonly known as: CELEXA   Take 20 mg by mouth daily.      ferrous sulfate 325 (65 FE) MG tablet   Take 325 mg by mouth daily with breakfast.      vitamin C 500 MG tablet   Commonly known as: ASCORBIC ACID   Take 500 mg by mouth daily.           The results of significant diagnostics from this hospitalization (including imaging, microbiology, ancillary and laboratory) are listed below for reference.    Significant Diagnostic Studies: Nm Gi Blood Loss  08/08/2012  *RADIOLOGY REPORT*  Clinical Data: 78 year old with GI bleed.  NUCLEAR MEDICINE GASTROINTESTINAL BLEEDING STUDY  Technique:  Sequential abdominal images were obtained following intravenous administration of Tc-58m labeled red blood cells.  Radiopharmaceutical: CURIE ULTRATAG TECHNETIUM TC 48M- LABELED RED BLOOD CELLS IV KIT  Comparison: 07/30/2012  Findings: There is expected uptake within the abdominal vascular structures and the liver.  There is accumulation of the radiopharmaceutical within the right abdomen.  The location would be most compatible with the hepatic flexure and right side of the transverse colon.  IMPRESSION: Positive bleeding study.  The site of bleeding is in the right abdomen and near the hepatic flexure of the colon.   Original Report Authenticated By: Richarda Overlie, M.D.    Ir Angiogram Visceral Selective  08/09/2012  *RADIOLOGY REPORT*  Clinical Data: Recurrent lower GI bleed, with recent positive tagged red blood cell scintigraphy.  SELECTIVE VISCERAL ARTERIOGRAPHY,IR ULTRASOUND GUIDANCE VASC ACCESS RIGHT,ARTERIOGRAPHY,ADDITIONAL ARTERIOGRAPHY,TRANSCATHETER THERAPY EMBOLIZATION  Comparison: Scintigraphy from earlier the same day  Technique and findings: The procedure, risks (including but not limited to  bleeding, infection, organ damage), benefits, and alternatives were explained to the patient.  Questions regarding the procedure were encouraged and answered.  The patient understands and consents to the procedure.Right femoral region prepped and draped in the usual sterile fashion. Maximal barrier sterile technique was utilized including caps, mask, sterile gowns, sterile gloves, sterile drape, hand hygiene and skin antiseptic.  Intravenous Fentanyl and Versed were administered as conscious sedation during continuous cardiorespiratory monitoring by the radiology RN, with a total moderate sedation time of 35 minutes.  Under real time ultrasound guidance, the right common femoral artery was accessed with a 21-gauge micropuncture needle in a single pass using single-wall technique. Ultrasound documentation was stored.  The needle was exchanged over a .018 wire for a transitional dilator, which allowed passage  of a Benson wire into the abdominal aorta.  Over this, a 6-French vascular sheath was placed, through which a 5-French pigtail catheter was advanced for flush aortography.  The aortogram shows minimal atheromatous irregularity of the infrarenal abdominal aorta without stenosis or aneurysm.  Celiac axis, superior mesenteric artery, and bilateral renal arteries widely patent.  Inferior mesenteric artery is patent.  Minimal plaque at the aortic bifurcation without stenosis.  Active extravasation in the right lower quadrant was noted.  The pigtail was exchanged for a 5-French C2 catheter, used to selectively catheterize the superior mesenteric artery for selective arteriography.  This confirmed   a focal area of active extravasation in the proximal/mid ascending colon, fed by SMA branches.  A Renegade microcatheter with angled guide wire was advanced coaxially used to selectively catheterize the feeding branch.  After confirmatory arteriography, a 2 mm x 3 cm Interlock coil was deployed in the peripheral   supplying  branch to the active bleed.  Follow-up selective arteriography shows no further extravasation or other supply to the bleeding region.  Confirmatory superior mesenteric arteriography demonstrates no further extravasation, and no other complication from the procedure.  After confirmatory femoral arteriography, the sheath was removed and hemostasis achieved with the Exoseal device. The patient tolerated the procedure well.  No immediate complication.  IMPRESSION: 1.  Active extravasation in the proximal/mid ascending colon, corresponding to the findings on recent scintigraphy. 2.  Technically successful subselective coil embolization with cessation of active extravasation.   Original Report Authenticated By: Osa Craver, M.D.    Ir Angiogram Selective Each Additional Vessel  08/09/2012  *RADIOLOGY REPORT*  Clinical Data: Recurrent lower GI bleed, with recent positive tagged red blood cell scintigraphy.  SELECTIVE VISCERAL ARTERIOGRAPHY,IR ULTRASOUND GUIDANCE VASC ACCESS RIGHT,ARTERIOGRAPHY,ADDITIONAL ARTERIOGRAPHY,TRANSCATHETER THERAPY EMBOLIZATION  Comparison: Scintigraphy from earlier the same day  Technique and findings: The procedure, risks (including but not limited to bleeding, infection, organ damage), benefits, and alternatives were explained to the patient.  Questions regarding the procedure were encouraged and answered.  The patient understands and consents to the procedure.Right femoral region prepped and draped in the usual sterile fashion. Maximal barrier sterile technique was utilized including caps, mask, sterile gowns, sterile gloves, sterile drape, hand hygiene and skin antiseptic.  Intravenous Fentanyl and Versed were administered as conscious sedation during continuous cardiorespiratory monitoring by the radiology RN, with a total moderate sedation time of 35 minutes.  Under real time ultrasound guidance, the right common femoral artery was accessed with a 21-gauge micropuncture needle  in a single pass using single-wall technique. Ultrasound documentation was stored.  The needle was exchanged over a .018 wire for a transitional dilator, which allowed passage of a Benson wire into the abdominal aorta.  Over this, a 6-French vascular sheath was placed, through which a 5-French pigtail catheter was advanced for flush aortography.  The aortogram shows minimal atheromatous irregularity of the infrarenal abdominal aorta without stenosis or aneurysm.  Celiac axis, superior mesenteric artery, and bilateral renal arteries widely patent.  Inferior mesenteric artery is patent.  Minimal plaque at the aortic bifurcation without stenosis.  Active extravasation in the right lower quadrant was noted.  The pigtail was exchanged for a 5-French C2 catheter, used to selectively catheterize the superior mesenteric artery for selective arteriography.  This confirmed   a focal area of active extravasation in the proximal/mid ascending colon, fed by SMA branches.  A Renegade microcatheter with angled guide wire was advanced coaxially used to selectively catheterize the feeding branch.  After  confirmatory arteriography, a 2 mm x 3 cm Interlock coil was deployed in the peripheral   supplying branch to the active bleed.  Follow-up selective arteriography shows no further extravasation or other supply to the bleeding region.  Confirmatory superior mesenteric arteriography demonstrates no further extravasation, and no other complication from the procedure.  After confirmatory femoral arteriography, the sheath was removed and hemostasis achieved with the Exoseal device. The patient tolerated the procedure well.  No immediate complication.  IMPRESSION: 1.  Active extravasation in the proximal/mid ascending colon, corresponding to the findings on recent scintigraphy. 2.  Technically successful subselective coil embolization with cessation of active extravasation.   Original Report Authenticated By: Osa Craver, M.D.      Ir Transcath/emboliz  08/09/2012  *RADIOLOGY REPORT*  Clinical Data: Recurrent lower GI bleed, with recent positive tagged red blood cell scintigraphy.  SELECTIVE VISCERAL ARTERIOGRAPHY,IR ULTRASOUND GUIDANCE VASC ACCESS RIGHT,ARTERIOGRAPHY,ADDITIONAL ARTERIOGRAPHY,TRANSCATHETER THERAPY EMBOLIZATION  Comparison: Scintigraphy from earlier the same day  Technique and findings: The procedure, risks (including but not limited to bleeding, infection, organ damage), benefits, and alternatives were explained to the patient.  Questions regarding the procedure were encouraged and answered.  The patient understands and consents to the procedure.Right femoral region prepped and draped in the usual sterile fashion. Maximal barrier sterile technique was utilized including caps, mask, sterile gowns, sterile gloves, sterile drape, hand hygiene and skin antiseptic.  Intravenous Fentanyl and Versed were administered as conscious sedation during continuous cardiorespiratory monitoring by the radiology RN, with a total moderate sedation time of 35 minutes.  Under real time ultrasound guidance, the right common femoral artery was accessed with a 21-gauge micropuncture needle in a single pass using single-wall technique. Ultrasound documentation was stored.  The needle was exchanged over a .018 wire for a transitional dilator, which allowed passage of a Benson wire into the abdominal aorta.  Over this, a 6-French vascular sheath was placed, through which a 5-French pigtail catheter was advanced for flush aortography.  The aortogram shows minimal atheromatous irregularity of the infrarenal abdominal aorta without stenosis or aneurysm.  Celiac axis, superior mesenteric artery, and bilateral renal arteries widely patent.  Inferior mesenteric artery is patent.  Minimal plaque at the aortic bifurcation without stenosis.  Active extravasation in the right lower quadrant was noted.  The pigtail was exchanged for a 5-French C2 catheter,  used to selectively catheterize the superior mesenteric artery for selective arteriography.  This confirmed   a focal area of active extravasation in the proximal/mid ascending colon, fed by SMA branches.  A Renegade microcatheter with angled guide wire was advanced coaxially used to selectively catheterize the feeding branch.  After confirmatory arteriography, a 2 mm x 3 cm Interlock coil was deployed in the peripheral   supplying branch to the active bleed.  Follow-up selective arteriography shows no further extravasation or other supply to the bleeding region.  Confirmatory superior mesenteric arteriography demonstrates no further extravasation, and no other complication from the procedure.  After confirmatory femoral arteriography, the sheath was removed and hemostasis achieved with the Exoseal device. The patient tolerated the procedure well.  No immediate complication.  IMPRESSION: 1.  Active extravasation in the proximal/mid ascending colon, corresponding to the findings on recent scintigraphy. 2.  Technically successful subselective coil embolization with cessation of active extravasation.   Original Report Authenticated By: Osa Craver, M.D.    Ir Angiogram Follow Up Study  08/09/2012  *RADIOLOGY REPORT*  Clinical Data: Recurrent lower GI bleed, with recent positive tagged  red blood cell scintigraphy.  SELECTIVE VISCERAL ARTERIOGRAPHY,IR ULTRASOUND GUIDANCE VASC ACCESS RIGHT,ARTERIOGRAPHY,ADDITIONAL ARTERIOGRAPHY,TRANSCATHETER THERAPY EMBOLIZATION  Comparison: Scintigraphy from earlier the same day  Technique and findings: The procedure, risks (including but not limited to bleeding, infection, organ damage), benefits, and alternatives were explained to the patient.  Questions regarding the procedure were encouraged and answered.  The patient understands and consents to the procedure.Right femoral region prepped and draped in the usual sterile fashion. Maximal barrier sterile technique was  utilized including caps, mask, sterile gowns, sterile gloves, sterile drape, hand hygiene and skin antiseptic.  Intravenous Fentanyl and Versed were administered as conscious sedation during continuous cardiorespiratory monitoring by the radiology RN, with a total moderate sedation time of 35 minutes.  Under real time ultrasound guidance, the right common femoral artery was accessed with a 21-gauge micropuncture needle in a single pass using single-wall technique. Ultrasound documentation was stored.  The needle was exchanged over a .018 wire for a transitional dilator, which allowed passage of a Benson wire into the abdominal aorta.  Over this, a 6-French vascular sheath was placed, through which a 5-French pigtail catheter was advanced for flush aortography.  The aortogram shows minimal atheromatous irregularity of the infrarenal abdominal aorta without stenosis or aneurysm.  Celiac axis, superior mesenteric artery, and bilateral renal arteries widely patent.  Inferior mesenteric artery is patent.  Minimal plaque at the aortic bifurcation without stenosis.  Active extravasation in the right lower quadrant was noted.  The pigtail was exchanged for a 5-French C2 catheter, used to selectively catheterize the superior mesenteric artery for selective arteriography.  This confirmed   a focal area of active extravasation in the proximal/mid ascending colon, fed by SMA branches.  A Renegade microcatheter with angled guide wire was advanced coaxially used to selectively catheterize the feeding branch.  After confirmatory arteriography, a 2 mm x 3 cm Interlock coil was deployed in the peripheral   supplying branch to the active bleed.  Follow-up selective arteriography shows no further extravasation or other supply to the bleeding region.  Confirmatory superior mesenteric arteriography demonstrates no further extravasation, and no other complication from the procedure.  After confirmatory femoral arteriography, the sheath  was removed and hemostasis achieved with the Exoseal device. The patient tolerated the procedure well.  No immediate complication.  IMPRESSION: 1.  Active extravasation in the proximal/mid ascending colon, corresponding to the findings on recent scintigraphy. 2.  Technically successful subselective coil embolization with cessation of active extravasation.   Original Report Authenticated By: Osa Craver, M.D.    Ir US Guide Vasc Access Right  08/09/2012  *RADIOLOGY REPORT*  Clinical Data: Recurrent lower GI bleed, with recent positive tagged red blood cell scintigraphy.  SELECTIVE VISCERAL ARTERIOGRAPHY,IR ULTRASOUND GUIDANCE VASC ACCESS RIGHT,ARTERIOGRAPHY,ADDITIONAL ARTERIOGRAPHY,TRANSCATHETER THERAPY EMBOLIZATION  Comparison: Scintigraphy from earlier the same day  Technique and findings: The procedure, risks (including but not limited to bleeding, infection, organ damage), benefits, and alternatives were explained to the patient.  Questions regarding the procedure were encouraged and answered.  The patient understands and consents to the procedure.Right femoral region prepped and draped in the usual sterile fashion. Maximal barrier sterile technique was utilized including caps, mask, sterile gowns, sterile gloves, sterile drape, hand hygiene and skin antiseptic.  Intravenous Fentanyl and Versed were administered as conscious sedation during continuous cardiorespiratory monitoring by the radiology RN, with a total moderate sedation time of 35 minutes.  Under real time ultrasound guidance, the right common femoral artery was accessed with a 21-gauge micropuncture needle in  a single pass using single-wall technique. Ultrasound documentation was stored.  The needle was exchanged over a .018 wire for a transitional dilator, which allowed passage of a Benson wire into the abdominal aorta.  Over this, a 6-French vascular sheath was placed, through which a 5-French pigtail catheter was advanced for flush  aortography.  The aortogram shows minimal atheromatous irregularity of the infrarenal abdominal aorta without stenosis or aneurysm.  Celiac axis, superior mesenteric artery, and bilateral renal arteries widely patent.  Inferior mesenteric artery is patent.  Minimal plaque at the aortic bifurcation without stenosis.  Active extravasation in the right lower quadrant was noted.  The pigtail was exchanged for a 5-French C2 catheter, used to selectively catheterize the superior mesenteric artery for selective arteriography.  This confirmed   a focal area of active extravasation in the proximal/mid ascending colon, fed by SMA branches.  A Renegade microcatheter with angled guide wire was advanced coaxially used to selectively catheterize the feeding branch.  After confirmatory arteriography, a 2 mm x 3 cm Interlock coil was deployed in the peripheral   supplying branch to the active bleed.  Follow-up selective arteriography shows no further extravasation or other supply to the bleeding region.  Confirmatory superior mesenteric arteriography demonstrates no further extravasation, and no other complication from the procedure.  After confirmatory femoral arteriography, the sheath was removed and hemostasis achieved with the Exoseal device. The patient tolerated the procedure well.  No immediate complication.  IMPRESSION: 1.  Active extravasation in the proximal/mid ascending colon, corresponding to the findings on recent scintigraphy. 2.  Technically successful subselective coil embolization with cessation of active extravasation.   Original Report Authenticated By: Osa Craver, M.D.     Microbiology: Recent Results (from the past 240 hour(s))  MRSA PCR SCREENING     Status: Normal   Collection Time   08/08/12 11:03 PM      Component Value Range Status Comment   MRSA by PCR NEGATIVE  NEGATIVE Final      Labs: Basic Metabolic Panel:  Lab 08/09/12 2841 08/08/12 1115  NA 136 135  K 3.9 4.2  CL 103  101  CO2 27 28  GLUCOSE 97 114*  BUN 14 12  CREATININE 0.77 0.73  CALCIUM 8.3* 8.2*  MG -- --  PHOS -- --   Liver Function Tests:  Lab 08/08/12 1115  AST 21  ALT 16  ALKPHOS 75  BILITOT 0.3  PROT 5.7*  ALBUMIN 2.6*   No results found for this basename: LIPASE:5,AMYLASE:5 in the last 168 hours No results found for this basename: AMMONIA:5 in the last 168 hours CBC:  Lab 08/10/12 1452 08/10/12 0455 08/09/12 1402 08/09/12 0450 08/08/12 1115  WBC 6.8 5.9 7.3 7.3 6.8  NEUTROABS -- -- -- -- 4.7  HGB 8.2* 8.0* 8.9* 8.4* 7.8*  HCT 25.1* 24.2* 26.1* 25.1* 22.9*  MCV 91.3 91.0 90.3 90.0 93.9  PLT 345 337 354 347 422*    Signed:  Dorla Guizar  [facility] 08/10/2012, 4:18 PM

## 2012-08-10 NOTE — Progress Notes (Signed)
Agree with A&P of MJ,PA she feels good this PM and is planning to go home

## 2012-08-11 LAB — TYPE AND SCREEN
Antibody Screen: NEGATIVE
Unit division: 0
Unit division: 0

## 2013-08-01 ENCOUNTER — Ambulatory Visit: Payer: Medicare Other | Attending: Family Medicine | Admitting: Physical Therapy

## 2013-08-01 DIAGNOSIS — M25559 Pain in unspecified hip: Secondary | ICD-10-CM | POA: Insufficient documentation

## 2013-08-01 DIAGNOSIS — IMO0001 Reserved for inherently not codable concepts without codable children: Secondary | ICD-10-CM | POA: Insufficient documentation

## 2013-08-01 DIAGNOSIS — R5381 Other malaise: Secondary | ICD-10-CM | POA: Insufficient documentation

## 2013-08-02 ENCOUNTER — Ambulatory Visit: Payer: Medicare Other | Admitting: Physical Therapy

## 2013-08-08 ENCOUNTER — Ambulatory Visit: Payer: Medicare Other | Admitting: Physical Therapy

## 2013-08-09 ENCOUNTER — Ambulatory Visit: Payer: Medicare Other | Admitting: Physical Therapy

## 2013-08-15 ENCOUNTER — Ambulatory Visit: Payer: Medicare Other | Admitting: Physical Therapy

## 2013-08-16 ENCOUNTER — Ambulatory Visit: Payer: Medicare Other | Admitting: Physical Therapy

## 2013-08-22 ENCOUNTER — Ambulatory Visit: Payer: Medicare Other | Attending: Family Medicine | Admitting: Physical Therapy

## 2013-08-22 DIAGNOSIS — IMO0001 Reserved for inherently not codable concepts without codable children: Secondary | ICD-10-CM | POA: Insufficient documentation

## 2013-08-22 DIAGNOSIS — M25559 Pain in unspecified hip: Secondary | ICD-10-CM | POA: Insufficient documentation

## 2013-08-22 DIAGNOSIS — R5381 Other malaise: Secondary | ICD-10-CM | POA: Insufficient documentation

## 2013-08-23 ENCOUNTER — Ambulatory Visit: Payer: Medicare Other | Admitting: Physical Therapy

## 2013-08-29 ENCOUNTER — Ambulatory Visit: Payer: Medicare Other | Admitting: Physical Therapy

## 2013-08-30 ENCOUNTER — Ambulatory Visit: Payer: Medicare Other | Admitting: Physical Therapy

## 2013-09-05 ENCOUNTER — Ambulatory Visit: Payer: Medicare Other | Admitting: Physical Therapy

## 2013-09-06 ENCOUNTER — Ambulatory Visit: Payer: Medicare Other | Admitting: Physical Therapy

## 2013-09-11 ENCOUNTER — Ambulatory Visit: Payer: Medicare Other | Admitting: Physical Therapy

## 2013-09-12 ENCOUNTER — Ambulatory Visit: Payer: Medicare Other | Admitting: Physical Therapy

## 2013-09-12 ENCOUNTER — Encounter: Payer: Self-pay | Admitting: Podiatry

## 2013-09-18 ENCOUNTER — Ambulatory Visit (INDEPENDENT_AMBULATORY_CARE_PROVIDER_SITE_OTHER): Payer: Medicare Other | Admitting: Podiatry

## 2013-09-18 ENCOUNTER — Encounter: Payer: Self-pay | Admitting: Podiatry

## 2013-09-18 VITALS — BP 138/65 | HR 65 | Resp 16

## 2013-09-18 DIAGNOSIS — M79609 Pain in unspecified limb: Secondary | ICD-10-CM

## 2013-09-18 DIAGNOSIS — B351 Tinea unguium: Secondary | ICD-10-CM

## 2013-09-18 NOTE — Progress Notes (Signed)
Javia presents today with a chief complaint of painful toenails one through 5 bilateral.  Objective: Pulses remain strongly palpable bilateral nails are thick yellow dystrophic onychomycotic and painful palpation as well as debridement.  Assessment: Pain in limb secondary to onychomycosis 1 through 5 bilateral.  Plan: Debridement of nails in thickness and length as a covered service secondary to pain. Followup with her in 3 months.

## 2013-12-18 ENCOUNTER — Ambulatory Visit: Payer: Medicare Other | Admitting: Podiatry

## 2013-12-18 ENCOUNTER — Ambulatory Visit (INDEPENDENT_AMBULATORY_CARE_PROVIDER_SITE_OTHER): Payer: Medicare Other | Admitting: Podiatry

## 2013-12-18 ENCOUNTER — Encounter: Payer: Self-pay | Admitting: Podiatry

## 2013-12-18 VITALS — BP 132/63 | HR 65 | Resp 18

## 2013-12-18 DIAGNOSIS — M79609 Pain in unspecified limb: Secondary | ICD-10-CM

## 2013-12-18 DIAGNOSIS — B351 Tinea unguium: Secondary | ICD-10-CM

## 2013-12-18 NOTE — Progress Notes (Signed)
Trim toenails.  Objective: Vital signs are stable alert and oriented x3. Pulses are palpable bilateral. Nails are thick yellow dystrophic lytic mycotic painful palpation.  Assessment: Pain in limb secondary to onychomycosis 1 through 5 bilateral.  Plan: Debridement of nails 1 through 5 bilateral covered service secondary to pain.

## 2014-04-02 ENCOUNTER — Ambulatory Visit (INDEPENDENT_AMBULATORY_CARE_PROVIDER_SITE_OTHER): Payer: Medicare Other | Admitting: Podiatry

## 2014-04-02 ENCOUNTER — Encounter: Payer: Self-pay | Admitting: Podiatry

## 2014-04-02 VITALS — BP 110/55 | HR 65 | Resp 16

## 2014-04-02 DIAGNOSIS — B351 Tinea unguium: Secondary | ICD-10-CM

## 2014-04-02 DIAGNOSIS — M79609 Pain in unspecified limb: Secondary | ICD-10-CM

## 2014-04-02 NOTE — Progress Notes (Signed)
She presents today chief complaint of painful elongated toenails.  Objective: Nails are thick yellow dystrophic with mycotic painful palpation.  Assessment: Pain in limb secondary to onychomycosis.  Plan: Debridement nails 1 through 5 bilateral.

## 2014-04-25 ENCOUNTER — Encounter: Payer: Self-pay | Admitting: Neurology

## 2014-04-25 ENCOUNTER — Ambulatory Visit (INDEPENDENT_AMBULATORY_CARE_PROVIDER_SITE_OTHER): Payer: Medicare Other | Admitting: Neurology

## 2014-04-25 VITALS — BP 120/62 | HR 64 | Resp 14 | Ht 64.0 in | Wt 150.0 lb

## 2014-04-25 DIAGNOSIS — R279 Unspecified lack of coordination: Secondary | ICD-10-CM

## 2014-04-25 DIAGNOSIS — R270 Ataxia, unspecified: Secondary | ICD-10-CM

## 2014-04-25 DIAGNOSIS — W19XXXA Unspecified fall, initial encounter: Secondary | ICD-10-CM

## 2014-04-25 DIAGNOSIS — G244 Idiopathic orofacial dystonia: Secondary | ICD-10-CM | POA: Insufficient documentation

## 2014-04-25 DIAGNOSIS — I10 Essential (primary) hypertension: Secondary | ICD-10-CM

## 2014-04-25 DIAGNOSIS — G245 Blepharospasm: Secondary | ICD-10-CM

## 2014-04-25 LAB — BUN: BUN: 10 mg/dL (ref 6–23)

## 2014-04-25 LAB — CREATININE, SERUM: Creat: 0.84 mg/dL (ref 0.50–1.10)

## 2014-04-25 NOTE — Progress Notes (Signed)
Susan Henry was seen today in neurologic consultation at the request of [name], MD.  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  Sx's have been going on for several years (30 years per patient that she could feel it) but have been worse over the last year (family could visibly see it over the last year at least).  It started with tightness across the forehead and it has gotten worse over the last year, progressing to involve movements of the entire face.  She forefully blinks the eyes and will involuntarily smile and move the tongue about in the mouth.  No new medication changes.  Daughter states that PCP felt Celexa could contribute and they are currently weaning the medication.  No change in the sx's at all.  No other exposure to antipsychotics as far as she can remember.  No exposure to reglan.  Daughter notes changing in breathing pattern; has a staggering breathering pattern, more like a panting than a regular, fluid breathing pattern.  She does not feel SOB.  No hypophonia.  Some memory change per family.  No loss of bladder control but may have loss of bowel control depending on what she eats.  Will occasionally cough with eating but otherwise seems to swallow okay.  Balance is not as good as it was but has had a few falls over the last few years.  She lives in independent living at [facility].  She seems to drift to one direction when walking.  She did some therapy that helped about 6 months ago and that helped until it ended.  She does not exercise otherwise.  Sunlight seems to make the sx's in the eyes worse.    Neuroimaging has not previously been performed (at least not in the last 30 years)  PREVIOUS MEDICATIONS: n/a  ALLERGIES:   Allergies  Allergen Reactions  . Alprazolam Rash    CURRENT MEDICATIONS:  Current Outpatient Prescriptions on File Prior to Visit  Medication  Sig Dispense Refill  . amLODipine (NORVASC) 5 MG tablet       . bisoprolol (ZEBETA) 5 MG tablet Take 5 mg by mouth daily.       . calcium-vitamin D (OSCAL WITH D) 500-200 MG-UNIT per tablet Take 1 tablet by mouth daily.      . citalopram (CELEXA) 20 MG tablet Take 10 mg by mouth every other day.       . furosemide (LASIX) 20 MG tablet       . lisinopril (PRINIVIL,ZESTRIL) 20 MG tablet        No current facility-administered medications on file prior to visit.    PAST MEDICAL HISTORY:   Past Medical History  Diagnosis Date  . Hypertension   . Reflux   . Aortic sclerosis   . Insomnia   . Colon polyps     Hx of  adenomatous  . Diverticulosis   . Hemorrhoids, internal, with bleeding 01/08/2011  . Arthritis   . GERD (gastroesophageal reflux disease)   . Anemia     Iron deficiency   . Allergy     Seasonal  . Anxiety   . Depression   . Pneumonia   . Blood transfusion     PAST SURGICAL HISTORY:   Past Surgical History  Procedure Laterality Date  . Cholecystectomy    . Upper gastrointestinal endoscopy    . Low anterior bowel resection  1996  diverticulitis  . Colonoscopy  01/2011  . Colonoscopy  11/18/2011    Procedure: COLONOSCOPY;  Surgeon: Rob Buntinganiel Jacobs, MD;  Location: [facility];  Service: Endoscopy;  Laterality: N/A;  . Esophagogastroduodenoscopy  11/18/2011    Procedure: ESOPHAGOGASTRODUODENOSCOPY (EGD);  Surgeon: Rob Buntinganiel Jacobs, MD;  [facility];  Service: Endoscopy;  Laterality: N/A;  . Colonoscopy  11/24/2011    Procedure: COLONOSCOPY;  Surgeon: Erick BlinksJay Pyrtle, MD;  Location: [facility];  Service: Gastroenterology;  Laterality: N/A;  . Colon surgery      SOCIAL HISTORY:   History   Social History  . Marital Status: Married    Spouse Name: N/A    Number of Children: N/A  . Years of Education: N/A   Occupational History  . Unemployed      Homemaker   Social History Main Topics  . Smoking status: Never Smoker   . Smokeless tobacco: Never Used  .  Alcohol Use: 0.0 oz/week    0 drink(s) per week     Comment: occasional  . Drug Use: No  . Sexual Activity: No   Other Topics Concern  . Not on file   Social History Narrative  . No narrative on file    FAMILY HISTORY:   Family Status  Relation Status Death Age  . Father Deceased     pancreatic cancer  . Mother Deceased     heart disease  . Sister Deceased     ALS  . Brother Deceased     kidney failure  . Brother Deceased   . Brother Alive     heart disease  . Daughter Alive   . Daughter Alive   . Daughter Alive   . Son Alive   . Son Deceased     ROS:  Decreased appetite.  No weight loss.  A complete 10 system review of systems was obtained and was unremarkable apart from what is mentioned above.  PHYSICAL EXAMINATION:    VITALS:   Filed Vitals:   04/25/14 0850  BP: 120/62  Pulse: 64  Resp: 14  Height: 5\' 4"  (1.626 m)  Weight: 150 lb (68.04 kg)    GEN:  Normal appears female in no acute distress.  Appears stated age. HEENT:  Normocephalic, atraumatic. The mucous membranes are moist. The superficial temporal arteries are without ropiness or tenderness. Cardiovascular: Regular rate and rhythm. Lungs: Clear to auscultation bilaterally. Neck/Heme: There are no carotid bruits noted bilaterally.  NEUROLOGICAL: Orientation:  The patient is alert and oriented x 3.  Fund of knowledge is appropriate.  Recent and remote memory intact.  Attention span and concentration normal.  Repeats and names without difficulty. Cranial nerves: There is good facial symmetry. The pupils are equal round and reactive to light bilaterally. 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 Extraocular muscles are intact and visual fields are full to confrontational testing. Speech is fluent and clear. Soft palate rises symmetrically and there is no tongue deviation. 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  Tongue does not  protrude outside of the mouth.  No tongue fasciculations.   Hearing is intact to conversational tone. Tone: Tone is good throughout. Sensation: Sensation is intact to light touch and pinprick throughout (facial, trunk, extremities). Vibration is completely absent in the LE but pinprick is symmetric as is light touch and no change in stocking distribution. There is no extinction with double simultaneous stimulation. There is no sensory dermatomal level identified. Coordination:  The patient  has no difficulty with RAM's or FNF bilaterally. Motor: Strength is 5/5 in the bilateral upper and lower extremities.  Shoulder shrug is equal and symmetric. There is no pronator drift.  There are no fasciculations noted. DTR's: Deep tendon reflexes are 2/4 at the bilateral biceps, triceps, brachioradialis, patella and 1/4 at the bilateral achilles.  Plantar responses are downgoing bilaterally. Gait and Station: The patient is able to arise out of the chair without difficulty.  She has a mildly unsteady gait.  She is mildly ataxic.   IMPRESSION/PLAN  1. Meige syndrome  -I. had a long discussion with the patient and her family.  I discussed the nature of this disorder.  I discussed with them that this is a sporadic, rare movement disorder characterized by blepharospasm, oromandibular dystonia and in her case some mild spasmodic dysphonia as well.  This is not generally associated with other disease states.  Nonetheless, I am going to go ahead and do an MRI of the brain to make sure I am not missing anything, given the fact that her family describes some ataxia.  I do think that the fact that sunlight can cause a closure of the eyes could be contributing to the falls.  We talked about using sunglasses.  Pt education printed and given.  Much greater than 50% of this visit was spent in counseling with the patient and the family.  Total face to face time:  60 min  -We talked about the fact that oral medications generally  do not work well for this.  We talked about the use of Botox.  This can help the blepharospasm, although it generally is not good for the oromandibular dystonia.  The patient is really not sure that she wants this done, although her daughter certainly does.  I gave him some patient education.  The patient asked me to go ahead and get prior authorization for the Botox and, in the meantime, she will have the MRI and make a decision on Botox.  She and I did discuss the risks and benefits of Botox.  The patient was educated on the botulinum toxin the black blox warning.  The patient understands that this warning states that there have been reported cases of the Botox extending beyond the injection site and creating adverse effects, similar to those of botulism. This included loss of strength, trouble walking, hoarseness, trouble saying words clearly, loss of bladder control, trouble breathing, trouble swallowing, diplopia, blurry vision and ptosis. Most of the distant spread of Botox was happening in patients, primarily children, who received medication for spasticity or for cervical dystonia. The patient expressed understanding as did her family.  -we will call them after we receive authorization and find out if they wish to proceed.  If not, then I would just like to see her back in 6 month intervals.

## 2014-04-25 NOTE — Patient Instructions (Addendum)
1. We have scheduled [facility] on 05/07/2014 at 1:00 pm. Please arrive 15 minutes prior and go to 1st floor radiology. If you need to reschedule for any reason please call [phone]. 2. Your provider has requested that you have labwork completed today. Please go to [facility] on the first floor of this building before leaving the office today. 3. We will call you after we receive approval for Botox.

## 2014-05-07 ENCOUNTER — Ambulatory Visit (HOSPITAL_COMMUNITY)
Admission: RE | Admit: 2014-05-07 | Discharge: 2014-05-07 | Disposition: A | Discharge status: Home / Self Care | Payer: Medicare Other | Source: Ambulatory Visit | Attending: Neurology | Admitting: Neurology

## 2014-05-07 ENCOUNTER — Telehealth: Payer: Self-pay | Admitting: Neurology

## 2014-05-07 DIAGNOSIS — G245 Blepharospasm: Secondary | ICD-10-CM

## 2014-05-07 DIAGNOSIS — R270 Ataxia, unspecified: Secondary | ICD-10-CM

## 2014-05-07 DIAGNOSIS — G518 Other disorders of facial nerve: Secondary | ICD-10-CM | POA: Insufficient documentation

## 2014-05-07 DIAGNOSIS — W19XXXA Unspecified fall, initial encounter: Secondary | ICD-10-CM

## 2014-05-07 MED ORDER — GADOBENATE DIMEGLUMINE 529 MG/ML IV SOLN
15.0000 mL | Freq: Once | INTRAVENOUS | Status: AC | PRN
Start: 2014-05-07 — End: 2014-05-07
  Administered 2014-05-07: 12 mL via INTRAVENOUS

## 2014-05-07 NOTE — Telephone Encounter (Signed)
Pt called back and Botox appt has been scheduled /18/15 / Sherri S.

## 2014-05-07 NOTE — Telephone Encounter (Signed)
Left message on machine for patient's daughter to call back. To make her aware Botox approved and make appt for September 18- next available Botox day. Awaiting call back.

## 2014-05-10 ENCOUNTER — Telehealth: Payer: Self-pay | Admitting: Neurology

## 2014-05-10 NOTE — Telephone Encounter (Signed)
Patient's daughter made aware that MR showed no tumors or strokes. She wanted to know what to do with patient's Celexa. She states while waiting for appt with us her PCP decreased Celexa to 1/2 tablet every other day. She wants to know if it is safe to go back to previous dose with her diagnosis. Please advise.

## 2014-05-10 NOTE — Telephone Encounter (Signed)
Message copied by [name], [name] on Fri May 10, 2014 10:49 AM ------      Message from: TAT, REBECCA S      Created: Fri May 10, 2014  8:53 AM      Regarding: RE: MR results       Yes, no tumors or strokes.        ----- Message -----         From: [name], CMA         Sent: 05/10/2014   8:35 AM           To: [name], DO      Subject: MR results                                               Have you reviewed results? Ok to call?            [name]       ------

## 2014-05-10 NOTE — Telephone Encounter (Signed)
Patient's daughter made aware. They will discuss with PCP.

## 2014-05-10 NOTE — Telephone Encounter (Signed)
I have no objection to that as long as PCP doesn't.

## 2014-05-16 ENCOUNTER — Telehealth: Payer: Self-pay | Admitting: Neurology

## 2014-05-16 NOTE — Telephone Encounter (Signed)
Pt's daughter called wanting to speak with you regarding her mother's meds. Please call her [phone]

## 2014-05-16 NOTE — Telephone Encounter (Signed)
Tried to call back and the number says something in Spanish and then there is a dial tone. I tried to number x 3.

## 2014-05-31 ENCOUNTER — Other Ambulatory Visit: Payer: Self-pay | Admitting: Neurology

## 2014-05-31 DIAGNOSIS — G245 Blepharospasm: Secondary | ICD-10-CM

## 2014-07-05 ENCOUNTER — Encounter: Payer: Self-pay | Admitting: Neurology

## 2014-07-05 ENCOUNTER — Ambulatory Visit (INDEPENDENT_AMBULATORY_CARE_PROVIDER_SITE_OTHER): Payer: Medicare Other | Admitting: Neurology

## 2014-07-05 VITALS — BP 122/68 | HR 72 | Ht 64.0 in | Wt 154.0 lb

## 2014-07-05 DIAGNOSIS — G245 Blepharospasm: Secondary | ICD-10-CM

## 2014-07-05 MED ORDER — ONABOTULINUMTOXINA 100 UNITS IJ SOLR
60.0000 [IU] | Freq: Once | INTRAMUSCULAR | Status: AC
  Administered 2014-07-05: 60 [IU] via INTRAMUSCULAR

## 2014-07-05 NOTE — Procedures (Signed)
[facility]   Procedure Note Botox  Attending: Dr. Lurena Joiner Tat  Preoperative Diagnosis(es): Blepharospasm; Meige syndrome; oromandibular dystonia    Consent obtained from: The patient Benefits discussed included, but were not limited to ability to open eyes and therefore see better.  Risk discussed included, but were not limited pain and discomfort, bleeding, bruising, excessive weakness, venous thrombosis, muscle atrophy and drooping of eyelids.  A copy of the patient medication guide was given to the patient which explains the blackbox warning.  Patients identity and treatment sites confirmed Yes.  .  Details of Procedure: Skin was cleaned with alcohol.  A 30 gauge, 1/2 inch needle was introduced to the target muscle.  Prior to injection, the needle plunger was aspirated to make sure the needle was not within a blood vessel.  There was no blood retrieved on aspiration.    Following is a summary of the muscles injected  And the amount of Botulinum toxin used:   Dilution 0.9% preservative free saline mixed with 100 u Botox type A to make 5 U per 0.1cc (2 cc of saline used per 100 U botox)  Injections  Location Left  Right Units  Upper lateral orbicularis oculi  2.5 2.5 5.0  Upper medial orbicularis oculi  2.5 2.5 5.0  Lateral orbicularis oculi  5.0 5.0 10.0  Lower lateral orbicularis oculi  2.5 2.5 5.0  Lower medial orbicularis oculi 2.5 2.5 5.0  Trapezius          TOTAL UNITS:   30.0   Agent: Botulinum Type A ( Onobotulinum Toxin type A ).  1 vials of Botox were used, each containing 50 units and freshly diluted with 2 mL of sterile, non-perserved saline   Total injected (Units): 30  Total wasted (Units): 30   Pt tolerated procedure well without complications.   Reinjection is anticipated in 3 months.

## 2014-07-09 ENCOUNTER — Ambulatory Visit (INDEPENDENT_AMBULATORY_CARE_PROVIDER_SITE_OTHER): Payer: Medicare Other | Admitting: Podiatry

## 2014-07-09 DIAGNOSIS — B351 Tinea unguium: Secondary | ICD-10-CM

## 2014-07-09 DIAGNOSIS — M79673 Pain in unspecified foot: Secondary | ICD-10-CM

## 2014-07-09 DIAGNOSIS — M79609 Pain in unspecified limb: Secondary | ICD-10-CM

## 2014-07-10 NOTE — Progress Notes (Signed)
She presents today with a chief complaint of painful elongated toenails.  Objective: Nails are thick yellow dystrophic with mycotic and painful palpation. Pulses are palpable bilateral.  Assessment: Pain in limb secondary to onychomycosis 1 through 5 bilateral.  Plan: Debridement of nails 1 through 5 bilateral. 

## 2014-08-16 ENCOUNTER — Telehealth: Payer: Self-pay | Admitting: Neurology

## 2014-08-16 NOTE — Telephone Encounter (Signed)
Spoke with patient's daughter to see how patient did with Botox. She states she is doing great. Her eyes are more open now instead of closed and when she blinks it is much slower. She is very happy with how she is doing after injection. They will keep Botox follow up in December.

## 2014-09-25 ENCOUNTER — Other Ambulatory Visit: Payer: Self-pay | Admitting: Neurology

## 2014-09-25 DIAGNOSIS — G245 Blepharospasm: Secondary | ICD-10-CM

## 2014-10-04 ENCOUNTER — Encounter: Payer: Self-pay | Admitting: Neurology

## 2014-10-04 ENCOUNTER — Ambulatory Visit (INDEPENDENT_AMBULATORY_CARE_PROVIDER_SITE_OTHER): Payer: Medicare Other | Admitting: Neurology

## 2014-10-04 VITALS — BP 114/66 | HR 60 | Ht 65.0 in | Wt 156.0 lb

## 2014-10-04 DIAGNOSIS — G245 Blepharospasm: Secondary | ICD-10-CM

## 2014-10-04 MED ORDER — ONABOTULINUMTOXINA 100 UNITS IJ SOLR
100.0000 [IU] | Freq: Once | INTRAMUSCULAR | Status: AC
  Administered 2014-10-04: 100 [IU] via INTRAMUSCULAR

## 2014-10-04 NOTE — Procedures (Signed)
[facility]   Procedure Note Botox  Attending: Dr. Lurena Joinerebecca Shareef Eddinger  Preoperative Diagnosis(es): Blepharospasm; Meige syndrome; oromandibular dystonia  Clinical notes:  Pt and family very pleased with efficacy of last round of botox.  Husband stated that just over the last day he has noted return of the eye blinking.      Consent obtained from: The patient Benefits discussed included, but were not limited to ability to open eyes and therefore see better.  Risk discussed included, but were not limited pain and discomfort, bleeding, bruising, excessive weakness, venous thrombosis, muscle atrophy and drooping of eyelids.  A copy of the patient medication guide was given to the patient which explains the blackbox warning.  Patients identity and treatment sites confirmed Yes.  .  Details of Procedure: Skin was cleaned with alcohol.  A 30 gauge, 1/2 inch needle was introduced to the target muscle.  Prior to injection, the needle plunger was aspirated to make sure the needle was not within a blood vessel.  There was no blood retrieved on aspiration.    Following is a summary of the muscles injected  And the amount of Botulinum toxin used:   Dilution 0.9% preservative free saline mixed with 100 u Botox type A to make 5 U per 0.1cc (2 cc of saline used per 100 U botox)  Injections  Location Left  Right Units  Upper lateral orbicularis oculi  2.5 2.5 5.0  Upper medial orbicularis oculi  2.5 2.5 5.0  Lateral orbicularis oculi  5.0 5.0 10.0  Lower lateral orbicularis oculi  2.5 2.5 5.0  Lower medial orbicularis oculi 2.5 2.5 5.0  Trapezius          TOTAL UNITS:   30.0   Agent: Botulinum Type A ( Onobotulinum Toxin type A ).  1 vials of Botox were used, each containing 50 units and freshly diluted with 2 mL of sterile, non-perserved saline   Total injected (Units): 30  Total wasted (Units): 70   Pt tolerated procedure well without complications.   Reinjection is anticipated in 3  months.

## 2014-10-08 ENCOUNTER — Ambulatory Visit (INDEPENDENT_AMBULATORY_CARE_PROVIDER_SITE_OTHER): Payer: Medicare Other | Admitting: Podiatry

## 2014-10-08 ENCOUNTER — Encounter: Payer: Self-pay | Admitting: Podiatry

## 2014-10-08 DIAGNOSIS — M79673 Pain in unspecified foot: Secondary | ICD-10-CM

## 2014-10-08 DIAGNOSIS — B351 Tinea unguium: Secondary | ICD-10-CM

## 2014-10-08 NOTE — Progress Notes (Signed)
She presents today with a chief complaint of painful elongated toenails.  Objective: Nails are thick yellow dystrophic with mycotic and painful palpation. Pulses are palpable bilateral.  Assessment: Pain in limb secondary to onychomycosis 1 through 5 bilateral.  Plan: Debridement of nails 1 through 5 bilateral.

## 2015-01-03 ENCOUNTER — Ambulatory Visit (INDEPENDENT_AMBULATORY_CARE_PROVIDER_SITE_OTHER): Payer: Medicare Other | Admitting: Neurology

## 2015-01-03 ENCOUNTER — Encounter: Payer: Self-pay | Admitting: Neurology

## 2015-01-03 VITALS — BP 120/74 | HR 80 | Ht 67.0 in | Wt 155.0 lb

## 2015-01-03 DIAGNOSIS — G245 Blepharospasm: Secondary | ICD-10-CM

## 2015-01-03 MED ORDER — ONABOTULINUMTOXINA 100 UNITS IJ SOLR
100.0000 [IU] | Freq: Once | INTRAMUSCULAR | Status: AC
  Administered 2015-01-03: 100 [IU] via INTRAMUSCULAR

## 2015-01-03 NOTE — Procedures (Signed)
[facility]   Procedure Note Botox  Attending: Dr. Lurena Joinerebecca [place]  Preoperative Diagnosis(es): Blepharospasm; Meige syndrome; oromandibular dystonia  Clinical notes:  Pt and husband very pleased with efficacy of last round of botox.  Husband stated that just over the last day he has noted return of the eye blinking.      Consent obtained from: The patient Benefits discussed included, but were not limited to ability to open eyes and therefore see better.  Risk discussed included, but were not limited pain and discomfort, bleeding, bruising, excessive weakness, venous thrombosis, muscle atrophy and drooping of eyelids.  A copy of the patient medication guide was given to the patient which explains the blackbox warning.  Patients identity and treatment sites confirmed Yes.  .  Details of Procedure: Skin was cleaned with alcohol.  A 30 gauge, 1/2 inch needle was introduced to the target muscle.  Prior to injection, the needle plunger was aspirated to make sure the needle was not within a blood vessel.  There was no blood retrieved on aspiration.    Following is a summary of the muscles injected  And the amount of Botulinum toxin used:   Dilution 0.9% preservative free saline mixed with 100 u Botox type A to make 5 U per 0.1cc (2 cc of saline used per 100 U botox)  Injections  Location Left  Right Units  Upper lateral orbicularis oculi  2.5 2.5 5.0  Upper medial orbicularis oculi  2.5 2.5 5.0  Lateral orbicularis oculi  5.0 5.0 10.0  Lower lateral orbicularis oculi  2.5 2.5 5.0  Lower medial orbicularis oculi 2.5 2.5 5.0  Trapezius          TOTAL UNITS:   30.0   Agent: Botulinum Type A ( Onobotulinum Toxin type A ).  1 vials of Botox were used, each containing 50 units and freshly diluted with 2 mL of sterile, non-perserved saline   Total injected (Units): 30  Total wasted (Units): 70   Pt tolerated procedure well without complications.   Reinjection is anticipated in 3  months.

## 2015-01-09 ENCOUNTER — Ambulatory Visit (INDEPENDENT_AMBULATORY_CARE_PROVIDER_SITE_OTHER): Payer: Medicare Other | Admitting: Podiatry

## 2015-01-09 DIAGNOSIS — B351 Tinea unguium: Secondary | ICD-10-CM | POA: Diagnosis not present

## 2015-01-09 DIAGNOSIS — M79673 Pain in unspecified foot: Secondary | ICD-10-CM | POA: Diagnosis not present

## 2015-01-09 NOTE — Progress Notes (Signed)
She presents today with a chief complaint of painful elongated toenails.  Objective: Nails are thick yellow dystrophic with mycotic and painful palpation. Pulses are palpable bilateral.  Assessment: Pain in limb secondary to onychomycosis 1 through 5 bilateral.  Plan: Debridement of nails 1 through 5 bilateral. 

## 2015-03-14 ENCOUNTER — Telehealth: Payer: Self-pay | Admitting: *Deleted

## 2015-03-14 NOTE — Telephone Encounter (Signed)
Patients daughter called with concerns about an extra charge on her bill for botox in march. She states that she spoke with billing and was told to call here. Patient would also like to reschedule her June appointment will be out of town on that day please advise. Call back number [phone]( rise vidovich)

## 2015-03-18 NOTE — Telephone Encounter (Signed)
Spoke with daughter about Botox charges, let her know that chemo denervation was a procedure code for the injection of the botox. F/U Botox appt r/s.

## 2015-03-28 ENCOUNTER — Ambulatory Visit (INDEPENDENT_AMBULATORY_CARE_PROVIDER_SITE_OTHER): Payer: Medicare Other | Admitting: Neurology

## 2015-03-28 ENCOUNTER — Encounter: Payer: Self-pay | Admitting: Neurology

## 2015-03-28 VITALS — Temp 97.5°F

## 2015-03-28 DIAGNOSIS — G245 Blepharospasm: Secondary | ICD-10-CM

## 2015-03-28 MED ORDER — ONABOTULINUMTOXINA 100 UNITS IJ SOLR
50.0000 [IU] | Freq: Once | INTRAMUSCULAR | Status: AC
  Administered 2015-03-28: 50 [IU] via INTRAMUSCULAR

## 2015-03-28 NOTE — Procedures (Signed)
[facility]   Procedure Note Botox  Attending: Dr. Lurena Joiner Tat  Preoperative Diagnosis(es): Blepharospasm; Meige syndrome; oromandibular dystonia  Clinical notes:  Pt and husband very pleased with efficacy of last round of botox.  Husband stated that just over the last day he has noted return of the eye blinking.   Daughter asks about memory change and asks about movements around neck and mouth.     Consent obtained from: The patient Benefits discussed included, but were not limited to ability to open eyes and therefore see better.  Risk discussed included, but were not limited pain and discomfort, bleeding, bruising, excessive weakness, venous thrombosis, muscle atrophy and drooping of eyelids.  A copy of the patient medication guide was given to the patient which explains the blackbox warning.  Patients identity and treatment sites confirmed Yes.  .  Details of Procedure: Skin was cleaned with alcohol.  A 30 gauge, 1/2 inch needle was introduced to the target muscle.  Prior to injection, the needle plunger was aspirated to make sure the needle was not within a blood vessel.  There was no blood retrieved on aspiration.    Following is a summary of the muscles injected  And the amount of Botulinum toxin used:   Dilution 0.9% preservative free saline mixed with 100 u Botox type A to make 5 U per 0.1cc (2 cc of saline used per 100 U botox)  Injections  Location Left  Right Units  Upper lateral orbicularis oculi  2.5 2.5 5.0  Upper medial orbicularis oculi  2.5 2.5 5.0  Lateral orbicularis oculi  5.0 5.0 10.0  Lower lateral orbicularis oculi  2.5 2.5 5.0  Lower medial orbicularis oculi 2.5 2.5 5.0  Trapezius          TOTAL UNITS:   30.0   Agent: Botulinum Type A ( Onobotulinum Toxin type A ).  1 vials of Botox were used, each containing 50 units and freshly diluted with 2 mL of sterile, non-perserved saline   Total injected (Units): 30  Total wasted (Units): 20   Pt  tolerated procedure well without complications.   Reinjection is anticipated in 3 months. Will make appt to discuss memory changes.

## 2015-04-11 ENCOUNTER — Ambulatory Visit: Payer: Medicare Other | Admitting: Neurology

## 2015-04-24 ENCOUNTER — Ambulatory Visit: Payer: Medicare Other

## 2015-04-24 ENCOUNTER — Encounter: Payer: Self-pay | Admitting: Neurology

## 2015-04-24 ENCOUNTER — Ambulatory Visit (INDEPENDENT_AMBULATORY_CARE_PROVIDER_SITE_OTHER): Payer: Medicare Other | Admitting: Neurology

## 2015-04-24 VITALS — BP 128/62 | HR 70 | Resp 18 | Ht 64.0 in | Wt 155.2 lb

## 2015-04-24 DIAGNOSIS — G244 Idiopathic orofacial dystonia: Secondary | ICD-10-CM | POA: Diagnosis not present

## 2015-04-24 DIAGNOSIS — G309 Alzheimer's disease, unspecified: Secondary | ICD-10-CM | POA: Diagnosis not present

## 2015-04-24 DIAGNOSIS — F0280 Dementia in other diseases classified elsewhere without behavioral disturbance: Secondary | ICD-10-CM

## 2015-04-24 MED ORDER — DONEPEZIL HCL 5 MG PO TABS: 5.0000 mg | ORAL_TABLET | Freq: Every day | ORAL | Status: DC

## 2015-04-24 MED ORDER — DONEPEZIL HCL 10 MG PO TABS: 10.0000 mg | ORAL_TABLET | Freq: Every day | ORAL | Status: DC

## 2015-04-24 NOTE — Progress Notes (Signed)
Susan Henry was seen today in neurologic consultation at the request of [name], MD.  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  Sx's have been going on for several years (30 years per patient that she could feel it) but have been worse over the last year (family could visibly see it over the last year at least).  It started with tightness across the forehead and it has gotten worse over the last year, progressing to involve movements of the entire face.  She forefully blinks the eyes and will involuntarily smile and move the tongue about in the mouth.  No new medication changes.  Daughter states that PCP felt Celexa could contribute and they are currently weaning the medication.  No change in the sx's at all.  No other exposure to antipsychotics as far as she can remember.  No exposure to reglan.  Daughter notes changing in breathing pattern; has a staggering breathering pattern, more like a panting than a regular, fluid breathing pattern.  She does not feel SOB.  No hypophonia.  Some memory change per family.  No loss of bladder control but may have loss of bowel control depending on what she eats.  Will occasionally cough with eating but otherwise seems to swallow okay.  Balance is not as good as it was but has had a few falls over the last few years.  She lives in independent living at [facility].  She seems to drift to one direction when walking.  She did some therapy that helped about 6 months ago and that helped until it ended.  She does not exercise otherwise.  Sunlight seems to make the sx's in the eyes worse.    04/24/15 update:  Pt returns today with her daughter and husband, who supplements the history.  They are here to discuss memory change.  Pt doesn't recognize memory change but husband states that it has been going downhill for a few years.  She quit driving 3-4 years ago because  "I just did not have my re-newed."  Her daughter puts her meds in a pill box x 2 years; prior to that her husband did that task.  Her daughter does the monthly finances because they just weren't getting done.  They live at [employer] [facility] and the cooking is done for them.  She is weaning off of the prilosec currently.  She states that she had some reflux last night.  They are trying to take it off as they were worried about its affect on memory.  No fam hx of memory problems.  MRI of the brain dated 05/07/2014 demonstrated mild atrophy, especially posteriorly, and mild small vessel disease.  I reviewed this with the patient and her family.  PREVIOUS MEDICATIONS: n/a  ALLERGIES:   Allergies  Allergen Reactions  . Alprazolam Rash    CURRENT MEDICATIONS:  Current Outpatient Prescriptions on File Prior to Visit  Medication Sig Dispense Refill  . alendronate (FOSAMAX) 70 MG tablet Take 70 mg by mouth once a week.   1  . amLODipine (NORVASC) 5 MG tablet Take 5 mg by mouth daily.     . bisoprolol (ZEBETA) 5 MG tablet Take 5 mg by mouth daily.     . calcium-vitamin D (OSCAL WITH D) 500-200 MG-UNIT per tablet Take 1 tablet by mouth daily.    . citalopram (CELEXA) 20 MG tablet Take 10 mg by mouth  every other day.     . furosemide (LASIX) 20 MG tablet Take 20 mg by mouth daily.     Marland Kitchen lisinopril (PRINIVIL,ZESTRIL) 20 MG tablet     . Multiple Vitamin (MULTIVITAMIN) tablet Take 1 tablet by mouth daily.    Marland Kitchen omeprazole (PRILOSEC) 20 MG capsule Take 20 mg by mouth daily.     No current facility-administered medications on file prior to visit.    PAST MEDICAL HISTORY:   Past Medical History  Diagnosis Date  . Hypertension   . Reflux   . Aortic sclerosis   . Insomnia   . Colon polyps     Hx of  adenomatous  . Diverticulosis   . Hemorrhoids, internal, with bleeding 01/08/2011  . Arthritis   . GERD (gastroesophageal reflux disease)   . Anemia     Iron deficiency   . Allergy     Seasonal  .  Anxiety   . Depression   . Pneumonia   . Blood transfusion     PAST SURGICAL HISTORY:   Past Surgical History  Procedure Laterality Date  . Cholecystectomy    . Upper gastrointestinal endoscopy    . Low anterior bowel resection  1996    diverticulitis  . Colonoscopy  01/2011  . Colonoscopy  11/18/2011    Procedure: COLONOSCOPY;  Surgeon: [name], MD;  Location: [facility];  Service: Endoscopy;  Laterality: N/A;  . Esophagogastroduodenoscopy  11/18/2011    Procedure: ESOPHAGOGASTRODUODENOSCOPY (EGD);  Surgeon: [name], MD;  Location: [facility];  Service: Endoscopy;  Laterality: N/A;  . Colonoscopy  11/24/2011    Procedure: COLONOSCOPY;  Surgeon: Erick Blinks, MD;  Location: [facility];  Service: Gastroenterology;  Laterality: N/A;  . Colon surgery      SOCIAL HISTORY:   History   Social History  . Marital Status: Married    Spouse Name: N/A  . Number of Children: N/A  . Years of Education: N/A   Occupational History  . Unemployed      Homemaker   Social History Main Topics  . Smoking status: Never Smoker   . Smokeless tobacco: Never Used  . Alcohol Use: 0.0 oz/week    0 drink(s) per week     Comment: occasional  . Drug Use: No  . Sexual Activity: No   Other Topics Concern  . Not on file   Social History Narrative    FAMILY HISTORY:   Family Status  Relation Status Death Age  . Father Deceased     pancreatic cancer  . Mother Deceased     heart disease  . Sister Deceased     ALS  . Brother Deceased     kidney failure  . Brother Deceased   . Brother Alive     heart disease  . Daughter Alive   . Daughter Alive   . Daughter Alive   . Son Alive   . Son Deceased     ROS:  Decreased appetite.  No weight loss.  A complete 10 system review of systems was obtained and was unremarkable apart from what is mentioned above.  PHYSICAL EXAMINATION:    VITALS:   Filed Vitals:   04/24/15 1408  BP: 128/62  Pulse: 70  Resp: 18  Height: 5\' 4"   (1.626 m)  Weight: 155 lb 3.2 oz (70.398 kg)  SpO2: 97%    GEN:  Normal appears female in no acute distress.  Appears stated age. HEENT:  Normocephalic, atraumatic. The mucous membranes are moist.  The superficial temporal arteries are without ropiness or tenderness. Cardiovascular: Regular rate and rhythm. Lungs: Clear to auscultation bilaterally. Neck/Heme: There are no carotid bruits noted bilaterally.  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  Speech is fluent and clear. Soft palate rises symmetrically and there is no tongue deviation. 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  Tongue does not protrude outside of the mouth.  No tongue fasciculations.   Hearing is intact to conversational tone. Tone: Tone is good throughout. Sensation: Sensation is intact to light touch throughout. Coordination:  The patient has no difficulty with RAM's or FNF bilaterally. Motor: Strength is 5/5 in the bilateral upper and lower extremities.  Shoulder shrug is equal and symmetric. There is no pronator drift.  There are no fasciculations noted. DTR's: Deep tendon reflexes are 2+/4 at the bilateral biceps, triceps, brachioradialis, patella and 1/4 at the bilateral achilles.  Plantar responses are downgoing bilaterally. Gait and Station: The patient is able to arise out of the chair without difficulty.  She has a mildly unsteady gait.  She is mildly ataxic.   IMPRESSION/PLAN  1. Meige syndrome  -She is doing well with Botox and will continue this. 2.  Dementia, likely dementia of the Alzheimer's type  -Long discussion with the patient and her family today.  Much greater than 50% of this 30 minute visit was spent in counseling with the patient and her family.  We discussed safety.  She is already an assisted  living, although independently.  She and I discussed the importance of engaging in activities at the assisted living facility.  We talked about safe, cardiovascular exercise.  We talked about the importance of learning new activities.  -We discussed medication and what it wouldn't would not provide.  We ultimately decided to start Aricept, 5 mg daily and work to 10 mg daily.  Risks, benefits, side effects and alternative therapies were discussed.  The opportunity to ask questions was given and they were answered to the best of my ability.  The patient expressed understanding and willingness to follow the outlined treatment protocols. 3.  Follow up is anticipated in the next few months, sooner should new neurologic issues arise.

## 2015-04-24 NOTE — Patient Instructions (Signed)
1.  Do physical and mental exercises 2.  Be social with your neighbors 3.  Start aricept - 5 mg daily and then increase to 10 mg daily

## 2015-05-08 ENCOUNTER — Encounter: Payer: Self-pay | Admitting: Podiatry

## 2015-05-08 ENCOUNTER — Other Ambulatory Visit: Payer: Self-pay | Admitting: Family Medicine

## 2015-05-08 ENCOUNTER — Ambulatory Visit
Admission: RE | Admit: 2015-05-08 | Discharge: 2015-05-08 | Disposition: A | Discharge status: Home / Self Care | Payer: Medicare Other | Source: Ambulatory Visit | Attending: Family Medicine | Admitting: Family Medicine

## 2015-05-08 ENCOUNTER — Ambulatory Visit (INDEPENDENT_AMBULATORY_CARE_PROVIDER_SITE_OTHER): Payer: Medicare Other | Admitting: Podiatry

## 2015-05-08 DIAGNOSIS — M79673 Pain in unspecified foot: Secondary | ICD-10-CM

## 2015-05-08 DIAGNOSIS — M545 Low back pain, unspecified: Secondary | ICD-10-CM

## 2015-05-08 DIAGNOSIS — B351 Tinea unguium: Secondary | ICD-10-CM | POA: Diagnosis not present

## 2015-05-08 NOTE — Progress Notes (Signed)
Patient ID: Susan Henry, female   DOB: 03/23/1925, 81 y.o.   MRN: [id number] Complaint:  Visit Type: Patient returns to my office for continued preventative foot care services. Complaint: Patient states" my nails have grown long and thick and become painful to walk and wear shoes" . The patient presents for preventative foot care services. No changes to ROS  Podiatric Exam: Vascular: dorsalis pedis and posterior tibial pulses are palpable bilateral. Capillary return is immediate. Temperature gradient is WNL. Skin turgor WNL  Sensorium: Normal Semmes Weinstein monofilament test. Normal tactile sensation bilaterally. Nail Exam: Pt has thick disfigured discolored nails with subungual debris noted bilateral entire nail hallux through fifth toenails Ulcer Exam: There is no evidence of ulcer or pre-ulcerative changes or infection. Orthopedic Exam: Muscle tone and strength are WNL. No limitations in general ROM. No crepitus or effusions noted. Foot type and digits show no abnormalities. Bony prominences are unremarkable. Skin: No Porokeratosis. No infection or ulcers  Diagnosis:  Onychomycosis, , Pain in right toe, pain in left toes  Treatment & Plan Procedures and Treatment: Consent by patient was obtained for treatment procedures. The patient understood the discussion of treatment and procedures well. All questions were answered thoroughly reviewed. Debridement of mycotic and hypertrophic toenails, 1 through 5 bilateral and clearing of subungual debris. No ulceration, no infection noted.  Return Visit-Office Procedure: Patient instructed to return to the office for a follow up visit 3 months for continued evaluation and treatment. 

## 2015-06-27 ENCOUNTER — Ambulatory Visit (INDEPENDENT_AMBULATORY_CARE_PROVIDER_SITE_OTHER): Payer: Medicare Other | Admitting: Neurology

## 2015-06-27 ENCOUNTER — Encounter: Payer: Self-pay | Admitting: Neurology

## 2015-06-27 VITALS — Temp 97.7°F

## 2015-06-27 DIAGNOSIS — G245 Blepharospasm: Secondary | ICD-10-CM | POA: Diagnosis not present

## 2015-06-27 MED ORDER — ONABOTULINUMTOXINA 100 UNITS IJ SOLR
55.0000 [IU] | Freq: Once | INTRAMUSCULAR | Status: AC
  Administered 2015-06-27: 55 [IU] via INTRAMUSCULAR

## 2015-06-27 NOTE — Procedures (Signed)
[facility]   Procedure Note Botox  Attending: Dr. Lurena Joiner Harmoni Lucus  Preoperative Diagnosis(es): Blepharospasm; Meige syndrome; oromandibular dystonia  Clinical notes:  Pt and husband very pleased with efficacy of last round of botox.      Consent obtained from: The patient Benefits discussed included, but were not limited to ability to open eyes and therefore see better.  Risk discussed included, but were not limited pain and discomfort, bleeding, bruising, excessive weakness, venous thrombosis, muscle atrophy and drooping of eyelids.  A copy of the patient medication guide was given to the patient which explains the blackbox warning.  Patients identity and treatment sites confirmed Yes.  .  Details of Procedure: Skin was cleaned with alcohol.  A 30 gauge, 1/2 inch needle was introduced to the target muscle.  Prior to injection, the needle plunger was aspirated to make sure the needle was not within a blood vessel.  There was no blood retrieved on aspiration.    Following is a summary of the muscles injected  And the amount of Botulinum toxin used:   Dilution 0.9% preservative free saline mixed with 100 u Botox type A to make 5 U per 0.1cc (2 cc of saline used per 100 U botox)  Injections  Location Left  Right Units  Upper lateral orbicularis oculi  2.5 2.5 5.0  Upper medial orbicularis oculi  2.5 2.5 5.0  Lateral orbicularis oculi  5.0 5.0 10.0  Lower lateral orbicularis oculi  2.5 2.5 5.0  Lower medial orbicularis oculi 2.5 2.5 5.0  Trapezius          TOTAL UNITS:   30.0   Agent: Botulinum Type A ( Onobotulinum Toxin type A ).  1 vials of Botox were used, each containing 50 units and freshly diluted with 2 mL of sterile, non-perserved saline   Total injected (Units): 30  Total wasted (Units): 25   Pt tolerated procedure well without complications.   Reinjection is anticipated in 3 months.

## 2015-08-14 ENCOUNTER — Encounter: Payer: Self-pay | Admitting: Podiatry

## 2015-08-14 ENCOUNTER — Ambulatory Visit: Payer: Medicare Other | Admitting: Podiatry

## 2015-08-14 ENCOUNTER — Ambulatory Visit (INDEPENDENT_AMBULATORY_CARE_PROVIDER_SITE_OTHER): Payer: Medicare Other | Admitting: Podiatry

## 2015-08-14 DIAGNOSIS — M79673 Pain in unspecified foot: Secondary | ICD-10-CM

## 2015-08-14 DIAGNOSIS — B351 Tinea unguium: Secondary | ICD-10-CM

## 2015-08-14 NOTE — Progress Notes (Signed)
Patient ID: Susan CanningCharlotte C Vogelsang, female   DOB: [birth year], 81 y.o.   MRN: [id number] Complaint:  Visit Type: Patient returns to my office for continued preventative foot care services. Complaint: Patient states" my nails have grown long and thick and become painful to walk and wear shoes" . The patient presents for preventative foot care services. No changes to ROS  Podiatric Exam: Vascular: dorsalis pedis and posterior tibial pulses are palpable bilateral. Capillary return is immediate. Temperature gradient is WNL. Skin turgor WNL  Sensorium: Normal Semmes Weinstein monofilament test. Normal tactile sensation bilaterally. Nail Exam: Pt has thick disfigured discolored nails with subungual debris noted bilateral entire nail hallux through fifth toenails Ulcer Exam: There is no evidence of ulcer or pre-ulcerative changes or infection. Orthopedic Exam: Muscle tone and strength are WNL. No limitations in general ROM. No crepitus or effusions noted. Foot type and digits show no abnormalities. Bony prominences are unremarkable. Skin: No Porokeratosis. No infection or ulcers  Diagnosis:  Onychomycosis, , Pain in right toe, pain in left toes  Treatment & Plan Procedures and Treatment: Consent by patient was obtained for treatment procedures. The patient understood the discussion of treatment and procedures well. All questions were answered thoroughly reviewed. Debridement of mycotic and hypertrophic toenails, 1 through 5 bilateral and clearing of subungual debris. No ulceration, no infection noted.  Return Visit-Office Procedure: Patient instructed to return to the office for a follow up visit 3 months for continued evaluation and treatment.

## 2015-08-28 ENCOUNTER — Encounter: Payer: Self-pay | Admitting: Neurology

## 2015-08-28 ENCOUNTER — Ambulatory Visit (INDEPENDENT_AMBULATORY_CARE_PROVIDER_SITE_OTHER): Payer: Medicare Other | Admitting: Neurology

## 2015-08-28 VITALS — BP 126/70 | HR 64 | Ht 66.0 in | Wt 154.0 lb

## 2015-08-28 DIAGNOSIS — G301 Alzheimer's disease with late onset: Secondary | ICD-10-CM

## 2015-08-28 DIAGNOSIS — G244 Idiopathic orofacial dystonia: Secondary | ICD-10-CM | POA: Diagnosis not present

## 2015-08-28 DIAGNOSIS — G245 Blepharospasm: Secondary | ICD-10-CM | POA: Diagnosis not present

## 2015-08-28 DIAGNOSIS — F0280 Dementia in other diseases classified elsewhere without behavioral disturbance: Secondary | ICD-10-CM | POA: Diagnosis not present

## 2015-08-28 NOTE — Patient Instructions (Signed)
1.  You need to ride your stationary bike on a daily basis, except for Sunday, for 30 minutes.  I want you to keep a calendar of your exercise and bring it to me next visit.

## 2015-08-28 NOTE — Progress Notes (Signed)
Susan Henry was seen today in neurologic consultation at the request of [name], MD.  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  Sx's have been going on for several years (30 years per patient that she could feel it) but have been worse over the last year (family could visibly see it over the last year at least).  It started with tightness across the forehead and it has gotten worse over the last year, progressing to involve movements of the entire face.  She forefully blinks the eyes and will involuntarily smile and move the tongue about in the mouth.  No new medication changes.  Daughter states that PCP felt Celexa could contribute and they are currently weaning the medication.  No change in the sx's at all.  No other exposure to antipsychotics as far as she can remember.  No exposure to reglan.  Daughter notes changing in breathing pattern; has a staggering breathering pattern, more like a panting than a regular, fluid breathing pattern.  She does not feel SOB.  No hypophonia.  Some memory change per family.  No loss of bladder control but may have loss of bowel control depending on what she eats.  Will occasionally cough with eating but otherwise seems to swallow okay.  Balance is not as good as it was but has had a few falls over the last few years.  She lives in independent living at [facility].  She seems to drift to one direction when walking.  She did some therapy that helped about 6 months ago and that helped until it ended.  She does not exercise otherwise.  Sunlight seems to make the sx's in the eyes worse.    04/24/15 update:  Pt returns today with her daughter and husband, who supplements the history.  They are here to discuss memory change.  Pt doesn't recognize memory change but husband states that it has been going downhill for a few years.  She quit driving 3-4 years ago because  "I just did not have my re-newed."  Her daughter puts her meds in a pill box x 2 years; prior to that her husband did that task.  Her daughter does the monthly finances because they just weren't getting done.  They live at [employer] and the cooking is done for them.  She is weaning off of the prilosec currently.  She states that she had some reflux last night.  They are trying to take it off as they were worried about its affect on memory.  No fam hx of memory problems.  08/28/15 update:  The patient returns today for follow-up, accompanied by her husband and daughter who supplement the history.  In July, we started the patient on Aricept, 5 mg daily for a month and then worked to 10 mg daily.  She is tolerating the medication well.  She states that mood has been good.  Memory has been stable.   Her last Botox for Meige syndrome/blepharospasm was on 06/27/2015.  She does much better with the medication and is able to hold her eyes open much more easily.  MRI of the brain dated 05/07/2014 demonstrated mild atrophy, especially posteriorly, and mild small vessel disease.  I reviewed this with the patient and her family.  PREVIOUS MEDICATIONS: n/a  ALLERGIES:   Allergies  Allergen Reactions  . Alprazolam Rash    CURRENT MEDICATIONS:  Current Outpatient  Prescriptions on File Prior to Visit  Medication Sig Dispense Refill  . alendronate (FOSAMAX) 70 MG tablet Take 70 mg by mouth once a week.   1  . amLODipine (NORVASC) 10 MG tablet   0  . bisoprolol (ZEBETA) 5 MG tablet Take 5 mg by mouth daily.     . citalopram (CELEXA) 20 MG tablet Take 10 mg by mouth every other day.     . donepezil (ARICEPT) 10 MG tablet Take 1 tablet (10 mg total) by mouth at bedtime. 30 tablet 3  . furosemide (LASIX) 20 MG tablet Take 20 mg by mouth daily.     Marland Kitchen. lisinopril (PRINIVIL,ZESTRIL) 20 MG tablet      No current facility-administered medications on file prior to visit.    PAST MEDICAL HISTORY:   Past  Medical History  Diagnosis Date  . Hypertension   . Reflux   . Aortic sclerosis (HCC)   . Insomnia   . Colon polyps     Hx of  adenomatous  . Diverticulosis   . Hemorrhoids, internal, with bleeding 01/08/2011  . Arthritis   . GERD (gastroesophageal reflux disease)   . Anemia     Iron deficiency   . Allergy     Seasonal  . Anxiety   . Depression   . Pneumonia   . Blood transfusion     PAST SURGICAL HISTORY:   Past Surgical History  Procedure Laterality Date  . Cholecystectomy    . Upper gastrointestinal endoscopy    . Low anterior bowel resection  1996    diverticulitis  . Colonoscopy  01/2011  . Colonoscopy  11/18/2011    Procedure: COLONOSCOPY;  Surgeon: Rob Buntinganiel Jacobs, MD;  Location: [facility];  Service: Endoscopy;  Laterality: N/A;  . Esophagogastroduodenoscopy  11/18/2011    Procedure: ESOPHAGOGASTRODUODENOSCOPY (EGD);  Surgeon: Rob Buntinganiel Jacobs, MD;  [facility];  Service: Endoscopy;  Laterality: N/A;  . Colonoscopy  11/24/2011    Procedure: COLONOSCOPY;  Surgeon: Erick BlinksJay Pyrtle, MD;  Location: [facility];  Service: Gastroenterology;  Laterality: N/A;  . Colon surgery      SOCIAL HISTORY:   Social History   Social History  . Marital Status: Married    Spouse Name: N/A  . Number of Children: N/A  . Years of Education: N/A   Occupational History  . Unemployed      Homemaker   Social History Main Topics  . Smoking status: Never Smoker   . Smokeless tobacco: Never Used  . Alcohol Use: 0.0 oz/week    0 drink(s) per week     Comment: occasional  . Drug Use: No  . Sexual Activity: No   Other Topics Concern  . Not on file   Social History Narrative    FAMILY HISTORY:   Family Status  Relation Status Death Age  . Father Deceased     pancreatic cancer  . Mother Deceased     heart disease  . Sister Deceased     ALS  . Brother Deceased     kidney failure  . Brother Deceased   . Brother Alive     heart disease  . Daughter Alive   . Daughter  Alive   . Daughter Alive   . Son Alive   . Son Deceased     ROS:  Decreased appetite.  No weight loss.  A complete 10 system review of systems was obtained and was unremarkable apart from what is mentioned above.  PHYSICAL EXAMINATION:  VITALS:   Filed Vitals:   08/28/15 1447  BP: 126/70  Pulse: 64  Height:  (1.676 m)  Weight: 154 lb (69.854 kg)    GEN:  Normal appears female in no acute distress.  Appears stated age. HEENT:  Normocephalic, atraumatic. The mucous membranes are moist. The superficial temporal arteries are without ropiness or tenderness. Cardiovascular: Regular rate and rhythm. Lungs: Clear to auscultation bilaterally. Neck/Heme: There are no carotid bruits noted bilaterally.  NEUROLOGICAL: Orientation:  Pt is alert and oriented to person/place.  MoCA was done last visit and the patient scored a 14/30  Cranial nerves: There is good facial symmetry.  Speech is fluent and clear. Soft palate rises symmetrically and there is no tongue deviation. 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  Tongue does not protrude outside of the mouth.  No tongue fasciculations. Blepharospasm is markedly improved from baseline.  Hearing is intact to conversational tone. Tone: Tone is good throughout. Sensation: Sensation is intact to light touch throughout. Coordination:  The patient has no difficulty with RAM's or FNF bilaterally. Motor: Strength is 5/5 in the bilateral upper and lower extremities.  Shoulder shrug is equal and symmetric. There is no pronator drift.  There are no fasciculations noted. DTR's: Deep tendon reflexes are 2+/4 at the bilateral biceps, triceps, brachioradialis, patella and 1/4 at the bilateral achilles.  Plantar responses are downgoing bilaterally. Gait and Station: The patient is able to arise out of the chair without difficulty.  She walks well today, without ataxia.     IMPRESSION/PLAN  1. Meige syndrome  -She is doing  well with Botox and will continue this. 2.  Dementia, likely dementia of the Alzheimer's type  -Long discussion with the patient and her family today.  Much greater than 50% of this 30 minute visit was spent in counseling with the patient and her family.  We discussed safety.  She is already an assisted living, although independently.  She and I discussed the importance of engaging in activities at the assisted living facility.  We talked about safe, cardiovascular exercise.  Stressed the importance of this today.  We talked about the importance of learning new activities.  -Continue Aricept, 10 mg daily. 3.  Follow up is anticipated in the next few months, sooner should new neurologic issues arise.  Much greater than 50% of this visit was spent in counseling with the patient and the family.  Total face to face time:  30 min

## 2015-09-04 IMAGING — CR DG LUMBAR SPINE COMPLETE 4+V
5 series · 5 of 5 positions shown · non-contrast
Comparison: None.

CLINICAL DATA: Intermittent right hip and low back pain for
approximately 1 month with no known injury. No history surgery.

EXAM:
LUMBAR SPINE - COMPLETE 4+ VIEW

[t lumbar spine ap (1 of 3)]
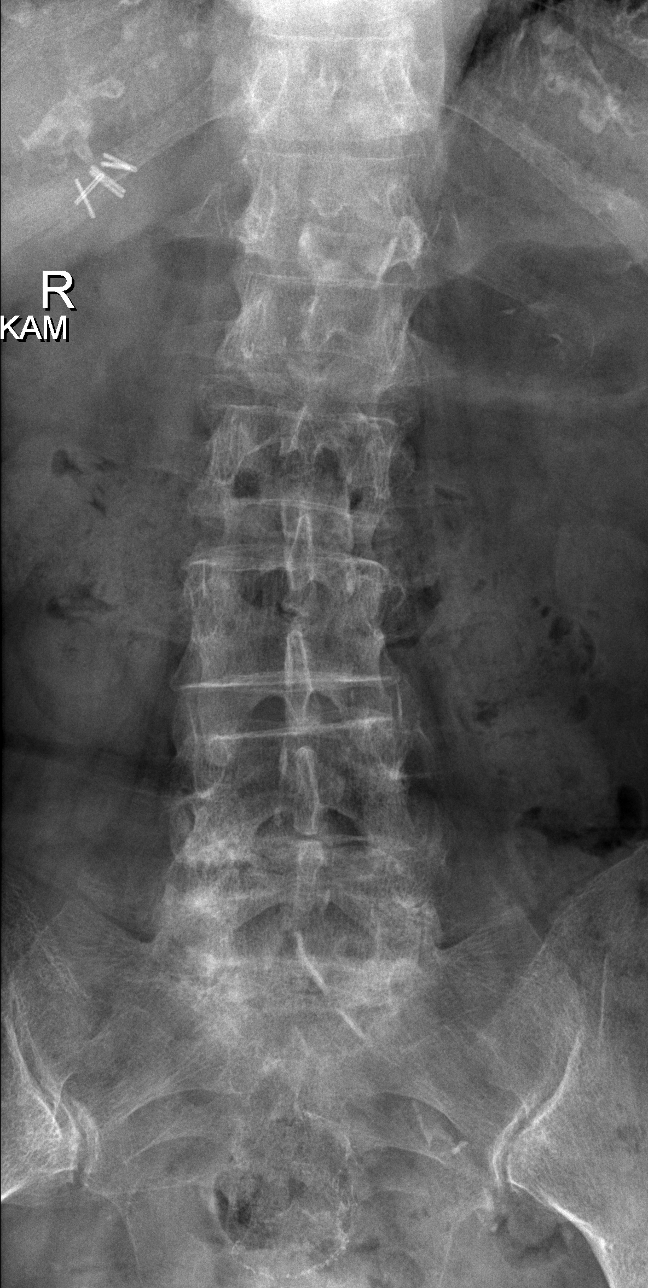

[t lumbar spine ap (2 of 3)]
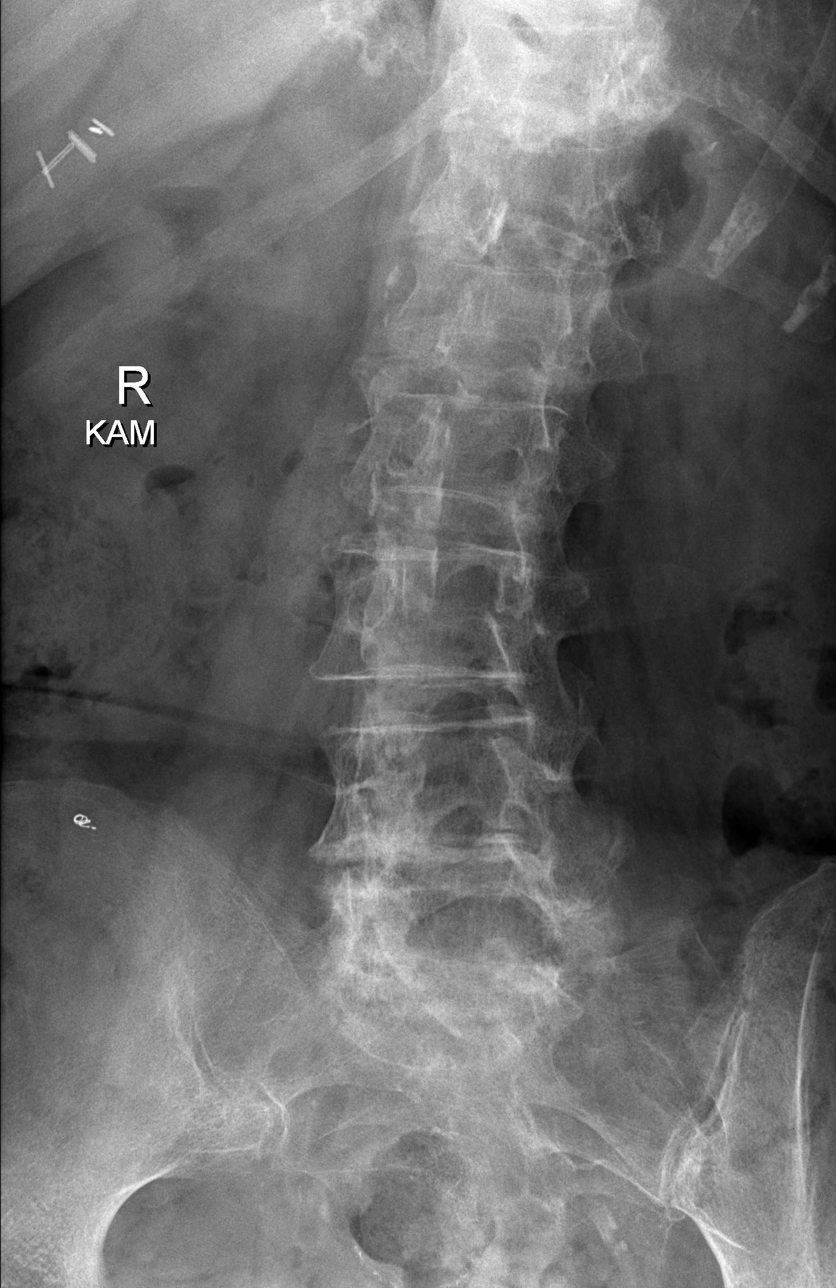

[t lumbar spine ap (3 of 3)]
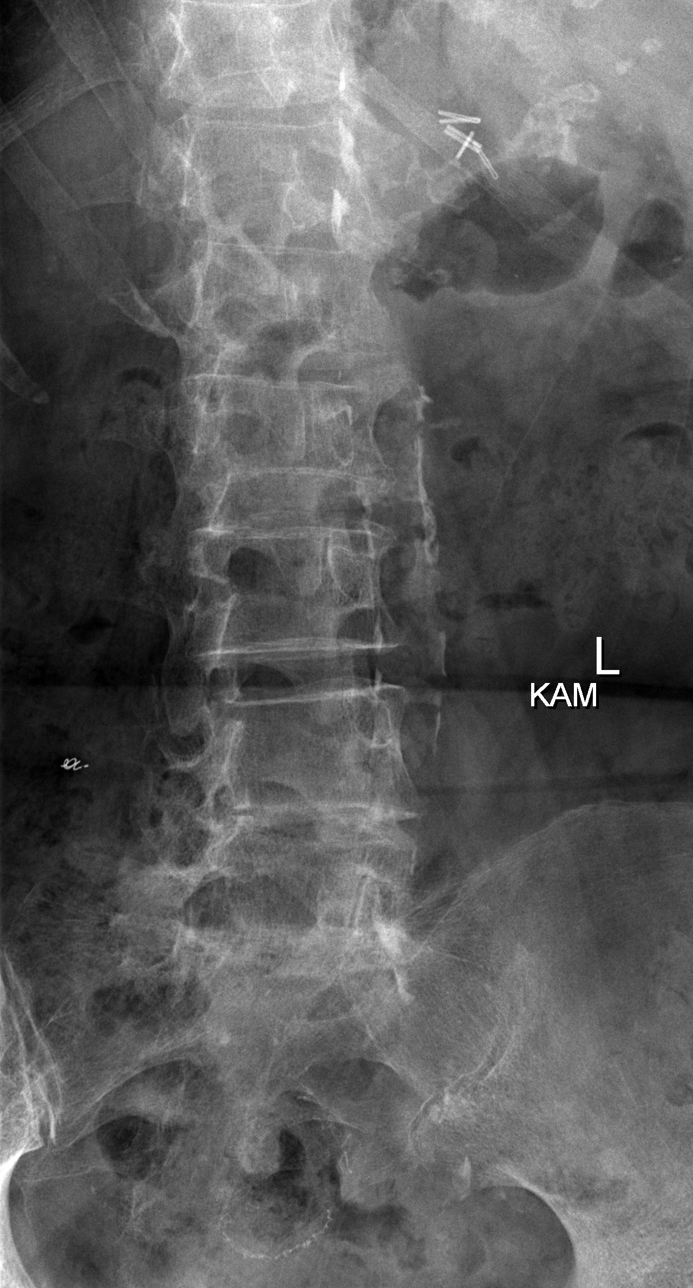

[t lumbar spine lat]
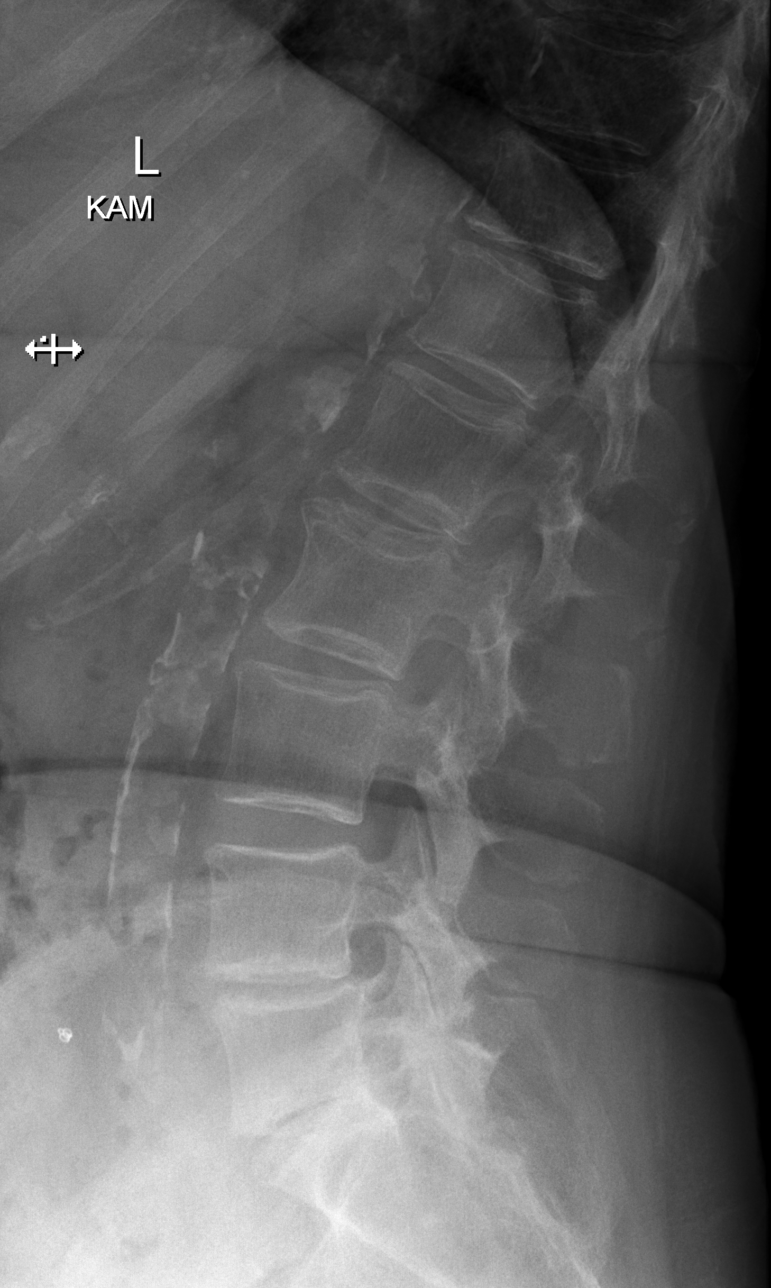

[t lumbar l-5 s-1 spot]
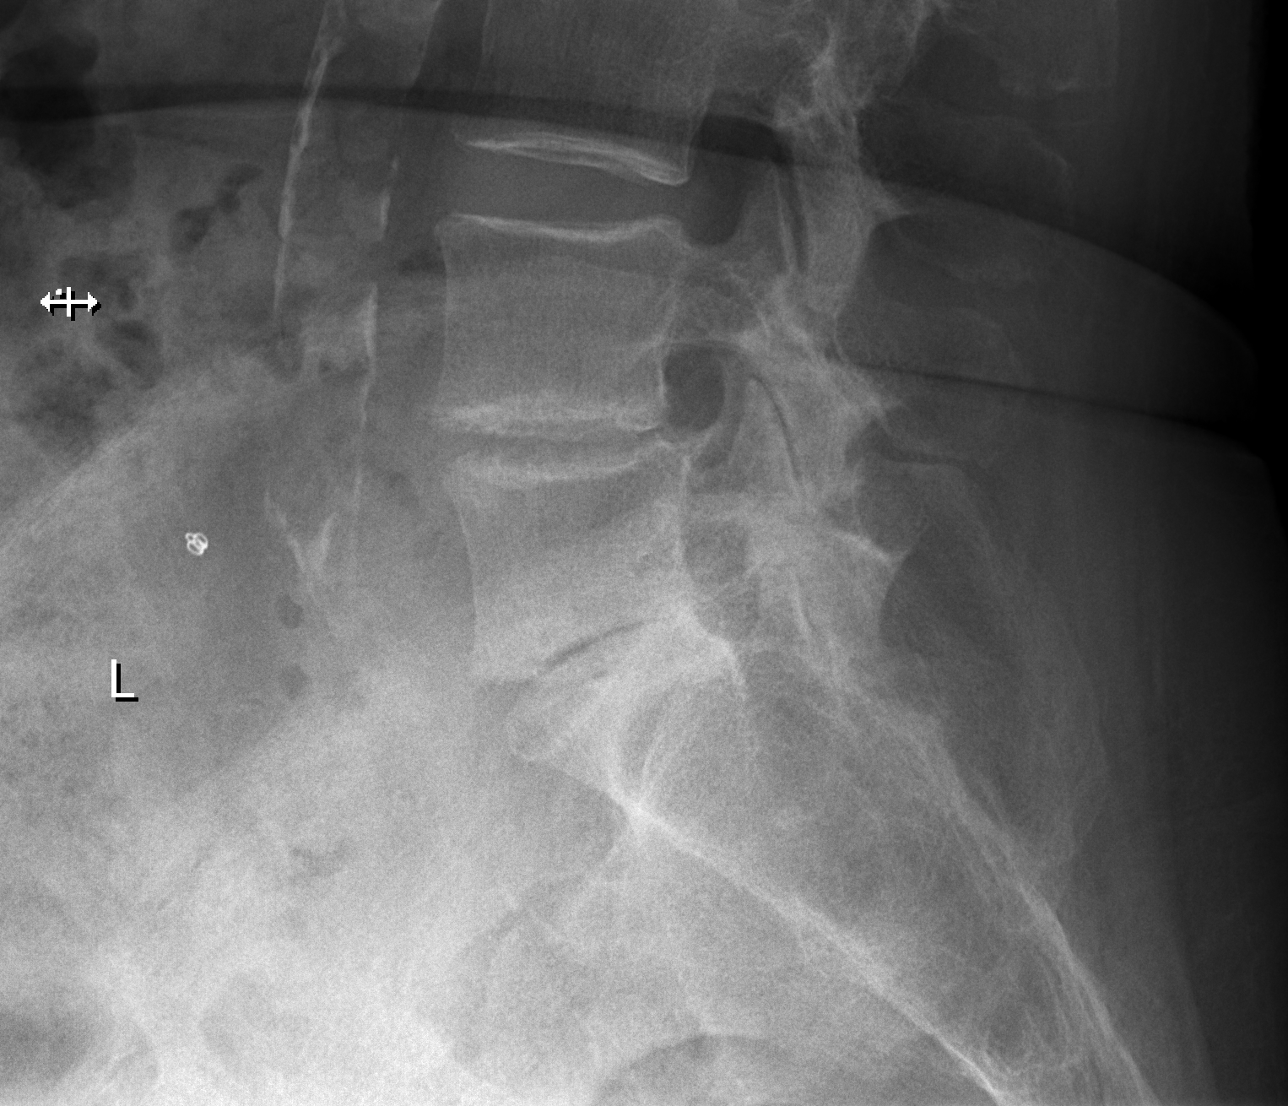

[5 of 5 positions shown; findings below may reference images not displayed]

FINDINGS: No fracture. Slight anterolisthesis of L4 on L5. No other
spondylolisthesis.

Mild loss of disc height from T10-T11 through L1-L2. Moderate loss
of disc height at L4-L5 and L5-S1 where there is also mild endplate
sclerosis. Mild facet degenerative change bilaterally at L4-L5 and
L5-S1.

Bones are diffusely demineralized. There is dense calcification
along a normal caliber abdominal aorta.

There is a mild dextroscoliosis, apex at L3.

Surgical clips are upper quadrant reflect a prior cholecystectomy.
IMPRESSION: 1. No fracture or acute finding.
2. Degenerative changes as detailed most evident in the lower lumbar
spine.

## 2015-10-10 ENCOUNTER — Ambulatory Visit (INDEPENDENT_AMBULATORY_CARE_PROVIDER_SITE_OTHER): Payer: Medicare Other | Admitting: Neurology

## 2015-10-10 VITALS — Temp 97.6°F

## 2015-10-10 DIAGNOSIS — G245 Blepharospasm: Secondary | ICD-10-CM

## 2015-10-10 MED ORDER — ONABOTULINUMTOXINA 100 UNITS IJ SOLR
50.0000 [IU] | Freq: Once | INTRAMUSCULAR | Status: AC
  Administered 2015-10-10: 50 [IU] via INTRAMUSCULAR

## 2015-10-10 NOTE — Procedures (Signed)
[facility]   Procedure Note Botox  Attending: Dr. Lurena Joinerebecca Markeita Alicia  Preoperative Diagnosis(es): Blepharospasm; Meige syndrome; oromandibular dystonia  Clinical notes:  Pt and husband very pleased with efficacy of last round of botox.      Consent obtained from: The patient Benefits discussed included, but were not limited to ability to open eyes and therefore see better.  Risk discussed included, but were not limited pain and discomfort, bleeding, bruising, excessive weakness, venous thrombosis, muscle atrophy and drooping of eyelids.  A copy of the patient medication guide was given to the patient which explains the blackbox warning.  Patients identity and treatment sites confirmed Yes.  .  Details of Procedure: Skin was cleaned with alcohol.  A 30 gauge, 1/2 inch needle was introduced to the target muscle.  Prior to injection, the needle plunger was aspirated to make sure the needle was not within a blood vessel.  There was no blood retrieved on aspiration.    Following is a summary of the muscles injected  And the amount of Botulinum toxin used:   Dilution 0.9% preservative free saline mixed with 100 u Botox type A to make 5 U per 0.1cc (2 cc of saline used per 100 U botox)  Injections  Location Left  Right Units  Upper lateral orbicularis oculi  2.5 2.5 5.0  Upper medial orbicularis oculi  2.5 2.5 5.0  Lateral orbicularis oculi  5.0 5.0 10.0  Lower lateral orbicularis oculi  2.5 2.5 5.0  Lower medial orbicularis oculi 2.5 2.5 5.0  Trapezius          TOTAL UNITS:   30.0   Agent: Botulinum Type A ( Onobotulinum Toxin type A ).  1 vials of Botox were used, each containing 50 units and freshly diluted with 2 mL of sterile, non-perserved saline   Total injected (Units): 30  Total wasted (Units): 10   Pt tolerated procedure well without complications.   Reinjection is anticipated in 3 months.

## 2015-11-13 ENCOUNTER — Ambulatory Visit: Payer: Medicare Other | Admitting: Podiatry

## 2015-12-11 ENCOUNTER — Other Ambulatory Visit: Payer: Self-pay | Admitting: Neurology

## 2015-12-11 MED ORDER — DONEPEZIL HCL 10 MG PO TABS: 10.0000 mg | ORAL_TABLET | Freq: Every day | ORAL | Status: DC

## 2015-12-11 NOTE — Telephone Encounter (Signed)
Aricept refill requested. Per last office note- patient to remain on medication. Refill approved and sent to patient's pharmacy.   

## 2016-01-05 ENCOUNTER — Telehealth: Payer: Self-pay | Admitting: Neurology

## 2016-01-05 NOTE — Telephone Encounter (Signed)
PT's daughter Rise called in regards to appointment on Friday and would like a call back/[name]  CB# [phone]

## 2016-01-05 NOTE — Telephone Encounter (Signed)
Patient's daughter called to move Botox appt. She booked a trip for her mother on this date. Appt moved to next available Botox day.

## 2016-01-05 NOTE — Telephone Encounter (Signed)
Called back and made them aware I can move her sooner with Dr Tat when another doctor in the office is performing Botox. Expressed appreciation and appt moved to April 7.

## 2016-01-09 ENCOUNTER — Ambulatory Visit: Payer: Medicare Other | Admitting: Neurology

## 2016-01-23 ENCOUNTER — Ambulatory Visit (INDEPENDENT_AMBULATORY_CARE_PROVIDER_SITE_OTHER): Payer: Medicare Other | Admitting: Neurology

## 2016-01-23 DIAGNOSIS — G245 Blepharospasm: Secondary | ICD-10-CM

## 2016-01-23 MED ORDER — ONABOTULINUMTOXINA 100 UNITS IJ SOLR
30.0000 [IU] | Freq: Once | INTRAMUSCULAR | Status: AC
  Administered 2016-01-23: 30 [IU] via INTRAMUSCULAR

## 2016-01-23 NOTE — Procedures (Signed)
[facility]   Procedure Note Botox  Attending: Dr. Offie Waide  Preoperative Diagnosis(es): Blepharospasm; Meige syndrome; oromandibular dystonia  Clinical notes:  Pt and husband very pleased with efficacy of last round of botox.      Consent obtained from: The patient Benefits discussed included, but were not limited to ability to open eyes and therefore see better.  Risk discussed included, but were not limited pain and discomfort, bleeding, bruising, excessive weakness, venous thrombosis, muscle atrophy and drooping of eyelids.  A copy of the patient medication guide was given to the patient which explains the blackbox warning.  Patients identity and treatment sites confirmed Yes.  .  Details of Procedure: Skin was cleaned with alcohol.  A 30 gauge, 1/2 inch needle was introduced to the target muscle.  Prior to injection, the needle plunger was aspirated to make sure the needle was not within a blood vessel.  There was no blood retrieved on aspiration.    Following is a summary of the muscles injected  And the amount of Botulinum toxin used:   Dilution 0.9% preservative free saline mixed with 100 u Botox type A to make 5 U per 0.1cc (2 cc of saline used per 100 U botox)  Injections  Location Left  Right Units  Upper lateral orbicularis oculi  2.5 2.5 5.0  Upper medial orbicularis oculi  2.5 2.5 5.0  Lateral orbicularis oculi  5.0 5.0 10.0  Lower lateral orbicularis oculi  2.5 2.5 5.0  Lower medial orbicularis oculi 2.5 2.5 5.0  Trapezius          TOTAL UNITS:   30.0   Agent: Botulinum Type A ( Onobotulinum Toxin type A ).  1 vials of Botox were used, each containing 50 units and freshly diluted with 2 mL of sterile, non-perserved saline   Total injected (Units): 30  Total wasted (Units): 0   Pt tolerated procedure well without complications.   Reinjection is anticipated in 3 months.    

## 2016-02-12 ENCOUNTER — Ambulatory Visit: Payer: Medicare Other | Admitting: Podiatry

## 2016-02-26 ENCOUNTER — Ambulatory Visit (INDEPENDENT_AMBULATORY_CARE_PROVIDER_SITE_OTHER): Payer: Medicare Other | Admitting: Neurology

## 2016-02-26 ENCOUNTER — Encounter: Payer: Self-pay | Admitting: Neurology

## 2016-02-26 VITALS — BP 140/70 | HR 65 | Ht 66.0 in | Wt 153.0 lb

## 2016-02-26 DIAGNOSIS — G308 Other Alzheimer's disease: Secondary | ICD-10-CM

## 2016-02-26 DIAGNOSIS — F0280 Dementia in other diseases classified elsewhere without behavioral disturbance: Secondary | ICD-10-CM

## 2016-02-26 DIAGNOSIS — G244 Idiopathic orofacial dystonia: Secondary | ICD-10-CM

## 2016-02-26 NOTE — Progress Notes (Signed)
Susan Henry was seen today in neurologic consultation at the request of Hollice EspyGATES,DONNA RUTH, MD.  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  Sx's have been going on for several years (30 years per patient that she could feel it) but have been worse over the last year (family could visibly see it over the last year at least).  It started with tightness across the forehead and it has gotten worse over the last year, progressing to involve movements of the entire face.  She forefully blinks the eyes and will involuntarily smile and move the tongue about in the mouth.  No new medication changes.  Daughter states that PCP felt Celexa could contribute and they are currently weaning the medication.  No change in the sx's at all.  No other exposure to antipsychotics as far as she can remember.  No exposure to reglan.  Daughter notes changing in breathing pattern; has a staggering breathering pattern, more like a panting than a regular, fluid breathing pattern.  She does not feel SOB.  No hypophonia.  Some memory change per family.  No loss of bladder control but may have loss of bowel control depending on what she eats.  Will occasionally cough with eating but otherwise seems to swallow okay.  Balance is not as good as it was but has had a few falls over the last few years.  [facility]  She seems to drift to one direction when walking.  She did some therapy that helped about 6 months ago and that helped until it ended.  She does not exercise otherwise.  Sunlight seems to make the sx's in the eyes worse.    04/24/15 update:  Pt returns today with her daughter and husband, who supplements the history.  They are here to discuss memory change.  Pt doesn't recognize memory change but husband states that it has been going downhill for a few years.  She quit driving 3-4 years ago because  "I just did not have my re-newed."  Her daughter puts her meds in a pill box x 2 years; prior to that her husband did that task.  Her daughter does the monthly finances because they just weren't getting done.  [facility] and the cooking is done for them.  She is weaning off of the prilosec currently.  She states that she had some reflux last night.  They are trying to take it off as they were worried about its affect on memory.  No fam hx of memory problems.  08/28/15 update:  The patient returns today for follow-up, accompanied by her husband and daughter who supplement the history.  In July, we started the patient on Aricept, 5 mg daily for a month and then worked to 10 mg daily.  She is tolerating the medication well.  She states that mood has been good.  Memory has been stable.   Her last Botox for Meige syndrome/blepharospasm was on 06/27/2015.  She does much better with the medication and is able to hold her eyes open much more easily.  02/26/16 update:  The patient returns today for follow-up, accompanied by her husband and daughter who supplement the history.  The patient has a hx of dementia and is on Aricept 10 mg daily.   She is tolerating the medication well.  Daughter thinks that she is pretty stable  but her husband thinks that she is slipping some, particularly with current events and remembering appts.  She is not exercising.   Her last Botox for Meige syndrome/blepharospasm was on 01/23/16.  She does much better with the medication and is able to hold her eyes open much more easily.  MRI of the brain dated 05/07/2014 demonstrated mild atrophy, especially posteriorly, and mild small vessel disease.  I reviewed this with the patient and her family.  PREVIOUS MEDICATIONS: n/a  ALLERGIES:   Allergies  Allergen Reactions  . Alprazolam Rash    CURRENT MEDICATIONS:  Current Outpatient Prescriptions on File Prior to Visit  Medication Sig Dispense Refill  . alendronate  (FOSAMAX) 70 MG tablet Take 70 mg by mouth once a week.   1  . amLODipine (NORVASC) 10 MG tablet   0  . bisoprolol (ZEBETA) 5 MG tablet Take 5 mg by mouth daily.     . citalopram (CELEXA) 20 MG tablet Take 10 mg by mouth every other day.     . donepezil (ARICEPT) 10 MG tablet Take 1 tablet (10 mg total) by mouth at bedtime. 30 tablet 5  . furosemide (LASIX) 20 MG tablet Take 20 mg by mouth daily.     . Glucosamine-Chondroitin (GLUCOSAMINE CHONDR COMPLEX PO) Take by mouth daily.    Marland Kitchen lisinopril (PRINIVIL,ZESTRIL) 20 MG tablet     . vitamin C (ASCORBIC ACID) 500 MG tablet Take 500 mg by mouth daily.     No current facility-administered medications on file prior to visit.    PAST MEDICAL HISTORY:   Past Medical History  Diagnosis Date  . Hypertension   . Reflux   . Aortic sclerosis (HCC)   . Insomnia   . Colon polyps     Hx of  adenomatous  . Diverticulosis   . Hemorrhoids, internal, with bleeding 01/08/2011  . Arthritis   . GERD (gastroesophageal reflux disease)   . Anemia     Iron deficiency   . Allergy     Seasonal  . Anxiety   . Depression   . Pneumonia   . Blood transfusion     PAST SURGICAL HISTORY:   Past Surgical History  Procedure Laterality Date  . Cholecystectomy    . Upper gastrointestinal endoscopy    . Low anterior bowel resection  1996    diverticulitis  . Colonoscopy  01/2011  . Colonoscopy  11/18/2011    Procedure: COLONOSCOPY;  Surgeon: [name], MD;  Location: [facility];  Service: Endoscopy;  Laterality: N/A;  . Esophagogastroduodenoscopy  11/18/2011    Procedure: ESOPHAGOGASTRODUODENOSCOPY (EGD);  Surgeon: [name], MD;  Location: [facility];  Service: Endoscopy;  Laterality: N/A;  . Colonoscopy  11/24/2011    Procedure: COLONOSCOPY;  Surgeon: Erick Blinks, MD;  Location: [facility];  Service: Gastroenterology;  Laterality: N/A;  . Colon surgery      SOCIAL HISTORY:   Social History   Social History  . Marital Status: Married     Spouse Name: N/A  . Number of Children: N/A  . Years of Education: N/A   Occupational History  . Unemployed      Homemaker   Social History Main Topics  . Smoking status: Never Smoker   . Smokeless tobacco: Never Used  . Alcohol Use: 0.0 oz/week    0 drink(s) per week     Comment: occasional  . Drug Use: No  . Sexual Activity: No   Other Topics Concern  . Not on file  Social History Narrative    FAMILY HISTORY:   Family Status  Relation Status Death Age  . Father Deceased     pancreatic cancer  . Mother Deceased     heart disease  . Sister Deceased     ALS  . Brother Deceased     kidney failure  . Brother Deceased   . Brother Alive     heart disease  . Daughter Alive   . Daughter Alive   . Daughter Alive   . Son Alive   . Son Deceased     ROS:  Decreased appetite.  No weight loss.  A complete 10 system review of systems was obtained and was unremarkable apart from what is mentioned above.  PHYSICAL EXAMINATION:    VITALS:   Filed Vitals:   02/26/16 1444  BP: 140/70  Pulse: 65  Height: 5\' 6"  (1.676 m)  Weight: 153 lb (69.4 kg)    GEN:  Normal appears female in no acute distress.  Appears stated age. HEENT:  Normocephalic, atraumatic. The mucous membranes are moist. The superficial temporal arteries are without ropiness or tenderness. Cardiovascular: Regular rate and rhythm. Lungs: Clear to auscultation bilaterally. Neck/Heme: There are no carotid bruits noted bilaterally.  NEUROLOGICAL: Orientation:  Pt is alert and oriented to person/place.  MoCA was done last visit and the patient scored a 14/30  Cranial nerves: There is good facial symmetry.  Speech is fluent and clear. Soft palate rises symmetrically and there is no tongue deviation. 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  Tongue does not protrude outside of the mouth.  No tongue fasciculations. Blepharospasm is markedly improved from baseline.  Hearing is intact  to conversational tone. Tone: Tone is good throughout. Sensation: Sensation is intact to light touch throughout. Coordination:  The patient has no difficulty with RAM's or FNF bilaterally. Motor: Strength is 5/5 in the bilateral upper and lower extremities.  Shoulder shrug is equal and symmetric. There is no pronator drift.  There are no fasciculations noted. DTR's: Deep tendon reflexes are 2+/4 at the bilateral biceps, triceps, brachioradialis, patella and 1/4 at the bilateral achilles.  Plantar responses are downgoing bilaterally. Gait and Station: The patient is able to arise out of the chair without difficulty.  She walks well today, without ataxia.     IMPRESSION/PLAN  1. Meige syndrome  -She is doing well with Botox and will continue this. 2.  Dementia, likely dementia of the Alzheimer's type  -Long discussion with the patient and her family today.  Much greater than 50% of this 30 minute visit was spent in counseling with the patient and her family.  We discussed safety.  She is already an assisted living, although independently.  She and I discussed the importance of engaging in activities at the assisted living facility.  We talked about safe, cardiovascular exercise.  Stressed the importance of this today.  We talked about the importance of learning new activities.  -Continue Aricept, 10 mg daily. 3.  Follow up is anticipated in the next few months, sooner should new neurologic issues arise.  Much greater than 50% of this visit was spent in counseling with the patient and the family.  Total face to face time:  30 min        Susan Henry was seen today in neurologic consultation at the request of [name], MD.  The consultation is for the evaluation of "facial dystonia."  Pt is accompanied by her husband and  daughter who supplements the history.  Sx's have been going on for several years (30 years per patient that she could feel it) but have been worse over the last year  (family could visibly see it over the last year at least).  It started with tightness across the forehead and it has gotten worse over the last year, progressing to involve movements of the entire face.  She forefully blinks the eyes and will involuntarily smile and move the tongue about in the mouth.  No new medication changes.  Daughter states that PCP felt Celexa could contribute and they are currently weaning the medication.  No change in the sx's at all.  No other exposure to antipsychotics as far as she can remember.  No exposure to reglan.  Daughter notes changing in breathing pattern; has a staggering breathering pattern, more like a panting than a regular, fluid breathing pattern.  She does not feel SOB.  No hypophonia.  Some memory change per family.  No loss of bladder control but may have loss of bowel control depending on what she eats.  Will occasionally cough with eating but otherwise seems to swallow okay.  Balance is not as good as it was but has had a few falls over the last few years.  [facility] lives in independent living at [facility]  She seems to drift to one direction when walking.  She did some therapy that helped about 6 months ago and that helped until it ended.  She does not exercise otherwise.  Sunlight seems to make the sx's in the eyes worse.    04/24/15 update:  Pt returns today with her daughter and husband, who supplements the history.  They are here to discuss memory change.  Pt doesn't recognize memory change but husband states that it has been going downhill for a few years.  She quit driving 3-4 years ago because "I just did not have my re-newed."  Her daughter puts her meds in a pill box x 2 years; prior to that her husband did that task.  Her daughter does the monthly finances because they just weren't getting done.  They live at [facility] and the cooking is done for them.  She is weaning off of the prilosec currently.  She states that she had some reflux last night.   They are trying to take it off as they were worried about its affect on memory.  No fam hx of memory problems.  08/28/15 update:  The patient returns today for follow-up, accompanied by her husband and daughter who supplement the history.  In July, we started the patient on Aricept, 5 mg daily for a month and then worked to 10 mg daily.  She is tolerating the medication well.  She states that mood has been good.  Memory has been stable.   Her last Botox for Meige syndrome/blepharospasm was on 06/27/2015.  She does much better with the medication and is able to hold her eyes open much more easily.  MRI of the brain dated 05/07/2014 demonstrated mild atrophy, especially posteriorly, and mild small vessel disease.  I reviewed this with the patient and her family.  PREVIOUS MEDICATIONS: n/a  ALLERGIES:   Allergies  Allergen Reactions  . Alprazolam Rash    CURRENT MEDICATIONS:  Current Outpatient Prescriptions on File Prior to Visit  Medication Sig Dispense Refill  . alendronate (FOSAMAX) 70 MG tablet Take 70 mg by mouth once a week.   1  . amLODipine (NORVASC) 10  MG tablet   0  . bisoprolol (ZEBETA) 5 MG tablet Take 5 mg by mouth daily.     . citalopram (CELEXA) 20 MG tablet Take 10 mg by mouth every other day.     . donepezil (ARICEPT) 10 MG tablet Take 1 tablet (10 mg total) by mouth at bedtime. 30 tablet 5  . furosemide (LASIX) 20 MG tablet Take 20 mg by mouth daily.     . Glucosamine-Chondroitin (GLUCOSAMINE CHONDR COMPLEX PO) Take by mouth daily.    Marland Kitchen lisinopril (PRINIVIL,ZESTRIL) 20 MG tablet     . vitamin C (ASCORBIC ACID) 500 MG tablet Take 500 mg by mouth daily.     No current facility-administered medications on file prior to visit.    PAST MEDICAL HISTORY:   Past Medical History  Diagnosis Date  . Hypertension   . Reflux   . Aortic sclerosis (HCC)   . Insomnia   . Colon polyps     Hx of  adenomatous  . Diverticulosis   . Hemorrhoids, internal, with bleeding 01/08/2011    . Arthritis   . GERD (gastroesophageal reflux disease)   . Anemia     Iron deficiency   . Allergy     Seasonal  . Anxiety   . Depression   . Pneumonia   . Blood transfusion     PAST SURGICAL HISTORY:   Past Surgical History  Procedure Laterality Date  . Cholecystectomy    . Upper gastrointestinal endoscopy    . Low anterior bowel resection  1996    diverticulitis  . Colonoscopy  01/2011  . Colonoscopy  11/18/2011    Procedure: COLONOSCOPY;  Surgeon: [name], MD;  Location: [facility];  Service: Endoscopy;  Laterality: N/A;  . Esophagogastroduodenoscopy  11/18/2011    Procedure: ESOPHAGOGASTRODUODENOSCOPY (EGD);  Surgeon: [name], MD;  Location: [facility];  Service: Endoscopy;  Laterality: N/A;  . Colonoscopy  11/24/2011    Procedure: COLONOSCOPY;  Surgeon: Erick Blinks, MD;  Location: [facility];  Service: Gastroenterology;  Laterality: N/A;  . Colon surgery      SOCIAL HISTORY:   Social History   Social History  . Marital Status: Married    Spouse Name: N/A  . Number of Children: N/A  . Years of Education: N/A   Occupational History  . Unemployed      Homemaker   Social History Main Topics  . Smoking status: Never Smoker   . Smokeless tobacco: Never Used  . Alcohol Use: 0.0 oz/week    0 drink(s) per week     Comment: occasional  . Drug Use: No  . Sexual Activity: No   Other Topics Concern  . Not on file   Social History Narrative    FAMILY HISTORY:   Family Status  Relation Status Death Age  . Father Deceased     pancreatic cancer  . Mother Deceased     heart disease  . Sister Deceased     ALS  . Brother Deceased     kidney failure  . Brother Deceased   . Brother Alive     heart disease  . Daughter Alive   . Daughter Alive   . Daughter Alive   . Son Alive   . Son Deceased     ROS:  Decreased appetite.  No weight loss.  A complete 10 system review of systems was obtained and was unremarkable apart from what is mentioned  above.  PHYSICAL EXAMINATION:    VITALS:  Filed Vitals:   02/26/16 1444  BP: 140/70  Pulse: 65  Height: 5\' 6"  (1.676 m)  Weight: 153 lb (69.4 kg)    GEN:  Normal appears female in no acute distress.  Appears stated age. HEENT:  Normocephalic, atraumatic. The mucous membranes are moist. The superficial temporal arteries are without ropiness or tenderness. Cardiovascular: Regular rate and rhythm. Lungs: Clear to auscultation bilaterally. Neck/Heme: There are no carotid bruits noted bilaterally.  NEUROLOGICAL: Orientation:  Pt is alert and oriented to person/place.   Cranial nerves: There is good facial symmetry.  Speech is fluent and clear. Soft palate rises symmetrically and there is no tongue deviation. 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  Tongue does not protrude outside of the mouth.  No tongue fasciculations. Blepharospasm is markedly improved from baseline.  Hearing is intact to conversational tone. Tone: Tone is good throughout. Sensation: Sensation is intact to light touch throughout. Coordination:  The patient has no difficulty with RAM's or FNF bilaterally. Motor: Strength is 5/5 in the bilateral upper and lower extremities.  Shoulder shrug is equal and symmetric. There is no pronator drift.  There are no fasciculations noted. Gait and Station: The patient is able to arise out of the chair without difficulty.  She walks well today, without ataxia.     IMPRESSION/PLAN  1. Meige syndrome  -She is doing well with Botox and will continue this.  Next injection on 04/09/16 at 2 pm 2.  Dementia, likely dementia of the Alzheimer's type  -Long discussion with the patient and her family today.  Much greater than 50% of this 30 minute visit was spent in counseling with the patient and her family.  We discussed safety.  She is already an assisted living, although independently.  She and I discussed the importance of engaging in activities at the  assisted living facility.  We talked about safe, cardiovascular exercise.  She is not doing this and talked to her about this again today.   -Continue Aricept, 10 mg daily.  -talked about namenda but they decided to hold on that for now. 3.  Follow up is anticipated in the next 6-8 months, sooner should new neurologic issues arise.  Much greater than 50% of this visit was spent in counseling with the patient and the family.  Total face to face time:  25 min

## 2016-03-04 ENCOUNTER — Ambulatory Visit: Payer: Medicare Other | Admitting: Neurology

## 2016-04-09 ENCOUNTER — Ambulatory Visit (INDEPENDENT_AMBULATORY_CARE_PROVIDER_SITE_OTHER): Payer: Medicare Other | Admitting: Neurology

## 2016-04-09 DIAGNOSIS — G518 Other disorders of facial nerve: Secondary | ICD-10-CM | POA: Diagnosis not present

## 2016-04-09 DIAGNOSIS — G5139 Clonic hemifacial spasm, unspecified: Secondary | ICD-10-CM

## 2016-04-09 MED ORDER — ONABOTULINUMTOXINA 100 UNITS IJ SOLR
30.0000 [IU] | Freq: Once | INTRAMUSCULAR | Status: AC
  Administered 2016-04-09: 30 [IU] via INTRAMUSCULAR

## 2016-04-09 NOTE — Procedures (Signed)
[facility]   Procedure Note Botox  Attending: Dr. Lurena Joinerebecca Tat  Preoperative Diagnosis(es): Blepharospasm; Meige syndrome; oromandibular dystonia  Clinical notes:  Pt and husband very pleased with efficacy of last round of botox.      Consent obtained from: The patient Benefits discussed included, but were not limited to ability to open eyes and therefore see better.  Risk discussed included, but were not limited pain and discomfort, bleeding, bruising, excessive weakness, venous thrombosis, muscle atrophy and drooping of eyelids.  A copy of the patient medication guide was given to the patient which explains the blackbox warning.  Patients identity and treatment sites confirmed Yes.  .  Details of Procedure: Skin was cleaned with alcohol.  A 30 gauge, 1/2 inch needle was introduced to the target muscle.  Prior to injection, the needle plunger was aspirated to make sure the needle was not within a blood vessel.  There was no blood retrieved on aspiration.    Following is a summary of the muscles injected  And the amount of Botulinum toxin used:   Dilution 0.9% preservative free saline mixed with 100 u Botox type A to make 5 U per 0.1cc (2 cc of saline used per 100 U botox)  Injections  Location Left  Right Units  Upper lateral orbicularis oculi  2.5 2.5 5.0  Upper medial orbicularis oculi  2.5 2.5 5.0  Lateral orbicularis oculi  5.0 5.0 10.0  Lower lateral orbicularis oculi  2.5 2.5 5.0  Lower medial orbicularis oculi 2.5 2.5 5.0  Trapezius          TOTAL UNITS:   30.0   Agent: Botulinum Type A ( Onobotulinum Toxin type A ).  1 vials of Botox were used, each containing 50 units and freshly diluted with 2 mL of sterile, non-perserved saline   Total injected (Units): 30  Total wasted (Units): 0   Pt tolerated procedure well without complications.   Reinjection is anticipated in 3 months.

## 2016-05-19 ENCOUNTER — Ambulatory Visit: Payer: Medicare Other | Admitting: Podiatry

## 2016-05-20 ENCOUNTER — Ambulatory Visit (INDEPENDENT_AMBULATORY_CARE_PROVIDER_SITE_OTHER): Payer: Medicare Other | Admitting: Podiatry

## 2016-05-20 ENCOUNTER — Encounter: Payer: Self-pay | Admitting: Podiatry

## 2016-05-20 DIAGNOSIS — B351 Tinea unguium: Secondary | ICD-10-CM | POA: Diagnosis not present

## 2016-05-20 DIAGNOSIS — M79673 Pain in unspecified foot: Secondary | ICD-10-CM

## 2016-05-20 NOTE — Progress Notes (Signed)
Patient ID: Susan Henry, female   DOB: 05/21/1925, 82 y.o.   MRN: [id number] Complaint:  Visit Type: Patient returns to my office for continued preventative foot care services. Complaint: Patient states" my nails have grown long and thick and become painful to walk and wear shoes" . The patient presents for preventative foot care services. No changes to ROS  Podiatric Exam: Vascular: dorsalis pedis and posterior tibial pulses are palpable bilateral. Capillary return is immediate. Temperature gradient is WNL. Skin turgor WNL  Sensorium: Normal Semmes Weinstein monofilament test. Normal tactile sensation bilaterally. Nail Exam: Pt has thick disfigured discolored nails with subungual debris noted bilateral entire nail hallux through fifth toenails Ulcer Exam: There is no evidence of ulcer or pre-ulcerative changes or infection. Orthopedic Exam: Muscle tone and strength are WNL. No limitations in general ROM. No crepitus or effusions noted. Foot type and digits show no abnormalities. Bony prominences are unremarkable. Skin: No Porokeratosis. No infection or ulcers  Diagnosis:  Onychomycosis, , Pain in right toe, pain in left toes  Treatment & Plan Procedures and Treatment: Consent by patient was obtained for treatment procedures. The patient understood the discussion of treatment and procedures well. All questions were answered thoroughly reviewed. Debridement of mycotic and hypertrophic toenails, 1 through 5 bilateral and clearing of subungual debris. No ulceration, no infection noted.  Return Visit-Office Procedure: Patient instructed to return to the office for a follow up visit 3 months for continued evaluation and treatment. 

## 2016-06-02 ENCOUNTER — Other Ambulatory Visit: Payer: Self-pay | Admitting: Neurology

## 2016-07-09 ENCOUNTER — Ambulatory Visit (INDEPENDENT_AMBULATORY_CARE_PROVIDER_SITE_OTHER): Payer: Medicare Other | Admitting: Neurology

## 2016-07-09 DIAGNOSIS — G245 Blepharospasm: Secondary | ICD-10-CM

## 2016-07-09 MED ORDER — ONABOTULINUMTOXINA 100 UNITS IJ SOLR
30.0000 [IU] | Freq: Once | INTRAMUSCULAR | Status: AC
  Administered 2016-07-09: 30 [IU] via INTRAMUSCULAR

## 2016-07-11 NOTE — Procedures (Signed)
[facility]   Procedure Note Botox  Attending: Dr. Lurena Joinerebecca Tat  Preoperative Diagnosis(es): Blepharospasm; Meige syndrome; oromandibular dystonia  Clinical notes:  Pt and husband very pleased with efficacy of last round of botox.      Consent obtained from: The patient Benefits discussed included, but were not limited to ability to open eyes and therefore see better.  Risk discussed included, but were not limited pain and discomfort, bleeding, bruising, excessive weakness, venous thrombosis, muscle atrophy and drooping of eyelids.  A copy of the patient medication guide was given to the patient which explains the blackbox warning.  Patients identity and treatment sites confirmed Yes.  .  Details of Procedure: Skin was cleaned with alcohol.  A 30 gauge, 1/2 inch needle was introduced to the target muscle.  Prior to injection, the needle plunger was aspirated to make sure the needle was not within a blood vessel.  There was no blood retrieved on aspiration.    Following is a summary of the muscles injected  And the amount of Botulinum toxin used:   Dilution 0.9% preservative free saline mixed with 100 u Botox type A to make 5 U per 0.1cc (2 cc of saline used per 100 U botox)  Injections  Location Left  Right Units  Upper lateral orbicularis oculi  2.5 2.5 5.0  Upper medial orbicularis oculi  2.5 2.5 5.0  Lateral orbicularis oculi  5.0 5.0 10.0  Lower lateral orbicularis oculi  2.5 2.5 5.0  Lower medial orbicularis oculi 2.5 2.5 5.0  Trapezius          TOTAL UNITS:   30.0   Agent: Botulinum Type A ( Onobotulinum Toxin type A ).  1 vials of Botox were used, each containing 50 units and freshly diluted with 2 mL of sterile, non-perserved saline   Total injected (Units): 30  Total wasted (Units): 15   Pt tolerated procedure well without complications.   Reinjection is anticipated in 3 months.

## 2016-08-19 ENCOUNTER — Ambulatory Visit: Payer: Medicare Other | Admitting: Podiatry

## 2016-08-26 ENCOUNTER — Ambulatory Visit (INDEPENDENT_AMBULATORY_CARE_PROVIDER_SITE_OTHER): Payer: Medicare Other | Admitting: Podiatry

## 2016-08-26 ENCOUNTER — Encounter: Payer: Self-pay | Admitting: Podiatry

## 2016-08-26 VITALS — Ht 66.0 in | Wt 153.0 lb

## 2016-08-26 DIAGNOSIS — M79676 Pain in unspecified toe(s): Secondary | ICD-10-CM

## 2016-08-26 DIAGNOSIS — B351 Tinea unguium: Secondary | ICD-10-CM

## 2016-08-26 NOTE — Progress Notes (Signed)
Patient ID: Susan CanningCharlotte C Henry, female   DOB: 1925/07/23, 82 y.o.   MRN: [id number] Complaint:  Visit Type: Patient returns to my office for continued preventative foot care services. Complaint: Patient states" my nails have grown long and thick and become painful to walk and wear shoes" . The patient presents for preventative foot care services. No changes to ROS  Podiatric Exam: Vascular: dorsalis pedis and posterior tibial pulses are palpable bilateral. Capillary return is immediate. Temperature gradient is WNL. Skin turgor WNL  Sensorium: Normal Semmes Weinstein monofilament test. Normal tactile sensation bilaterally. Nail Exam: Pt has thick disfigured discolored nails with subungual debris noted bilateral entire nail hallux through fifth toenails Ulcer Exam: There is no evidence of ulcer or pre-ulcerative changes or infection. Orthopedic Exam: Muscle tone and strength are WNL. No limitations in general ROM. No crepitus or effusions noted. Foot type and digits show no abnormalities. Bony prominences are unremarkable. Skin: No Porokeratosis. No infection or ulcers  Diagnosis:  Onychomycosis, , Pain in right toe, pain in left toes  Treatment & Plan Procedures and Treatment: Consent by patient was obtained for treatment procedures. The patient understood the discussion of treatment and procedures well. All questions were answered thoroughly reviewed. Debridement of mycotic and hypertrophic toenails, 1 through 5 bilateral and clearing of subungual debris. No ulceration, no infection noted.  Return Visit-Office Procedure: Patient instructed to return to the office for a follow up visit 3 months for continued evaluation and treatment.

## 2016-08-27 NOTE — Progress Notes (Signed)
Susan CanningCharlotte C Henry was seen today in neurologic consultation at the request of Hollice EspyGATES,DONNA RUTH, MD.  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  Sx's have been going on for several years (30 years per patient that she could feel it) but have been worse over the last year (family could visibly see it over the last year at least).  It started with tightness across the forehead and it has gotten worse over the last year, progressing to involve movements of the entire face.  She forefully blinks the eyes and will involuntarily smile and move the tongue about in the mouth.  No new medication changes.  Daughter states that PCP felt Celexa could contribute and they are currently weaning the medication.  No change in the sx's at all.  No other exposure to antipsychotics as far as she can remember.  No exposure to reglan.  Daughter notes changing in breathing pattern; has a staggering breathering pattern, more like a panting than a regular, fluid breathing pattern.  She does not feel SOB.  No hypophonia.  Some memory change per family.  No loss of bladder control but may have loss of bowel control depending on what she eats.  Will occasionally cough with eating but otherwise seems to swallow okay.  Balance is not as good as it was but has had a few falls over the last few years.  [facility]  She seems to drift to one direction when walking.  She did some therapy that helped about 6 months ago and that helped until it ended.  She does not exercise otherwise.  Sunlight seems to make the sx's in the eyes worse.    04/24/15 update:  Pt returns today with her daughter and husband, who supplements the history.  They are here to discuss memory change.  Pt doesn't recognize memory change but husband states that it has been going downhill for a few years.  She quit driving 3-4 years ago because  "I just did not have my re-newed."  Her daughter puts her meds in a pill box x 2 years; prior to that her husband did that task.  Her daughter does the monthly finances because they just weren't getting done.  [facility] and the cooking is done for them.  She is weaning off of the prilosec currently.  She states that she had some reflux last night.  They are trying to take it off as they were worried about its affect on memory.  No fam hx of memory problems.  08/28/15 update:  The patient returns today for follow-up, accompanied by her husband and daughter who supplement the history.  In July, we started the patient on Aricept, 5 mg daily for a month and then worked to 10 mg daily.  She is tolerating the medication well.  She states that mood has been good.  Memory has been stable.   Her last Botox for Meige syndrome/blepharospasm was on 06/27/2015.  She does much better with the medication and is able to hold her eyes open much more easily.  02/26/16 update:  The patient returns today for follow-up, accompanied by her husband and daughter who supplement the history.  The patient has a hx of dementia and is on Aricept 10 mg daily.   She is tolerating the medication well.  Daughter thinks that she is pretty stable  but her husband thinks that she is slipping some, particularly with current events and remembering appts.  She is not exercising.   Her last Botox for Meige syndrome/blepharospasm was on 01/23/16.  She does much better with the medication and is able to hold her eyes open much more easily.  08/30/16 update:  The patient returns today, accompanied by her husband who supplements the history.  She remains on Aricept for dementia.  She tolerates it well.  No diarrhea.  No significant bradycardic events.  She is not exercising.  Husband helps her to manage her medications.  She does not drive.  She is doing frequent crossword puzzles.  Doing well with Botox for blepharospasm/Meige syndrome.   No swallowing problems.  MRI of the brain dated 05/07/2014 demonstrated mild atrophy, especially posteriorly, and mild small vessel disease.  I reviewed this with the patient and her family.  PREVIOUS MEDICATIONS: n/a  ALLERGIES:   Allergies  Allergen Reactions  . Alprazolam Rash    CURRENT MEDICATIONS:  Current Outpatient Prescriptions on File Prior to Visit  Medication Sig Dispense Refill  . alendronate (FOSAMAX) 70 MG tablet Take 70 mg by mouth once a week.   1  . amLODipine (NORVASC) 10 MG tablet   0  . bisoprolol (ZEBETA) 5 MG tablet Take 5 mg by mouth daily.     . citalopram (CELEXA) 20 MG tablet Take 10 mg by mouth every other day.     . donepezil (ARICEPT) 10 MG tablet take 1 tablet by mouth at bedtime 30 tablet 5  . furosemide (LASIX) 20 MG tablet Take 20 mg by mouth daily.     . Glucosamine-Chondroitin (GLUCOSAMINE CHONDR COMPLEX PO) Take by mouth daily.    Marland Kitchen. lisinopril (PRINIVIL,ZESTRIL) 20 MG tablet     . vitamin C (ASCORBIC ACID) 500 MG tablet Take 500 mg by mouth daily.     No current facility-administered medications on file prior to visit.     PAST MEDICAL HISTORY:   Past Medical History:  Diagnosis Date  . Allergy    Seasonal  . Anemia    Iron deficiency   . Anxiety   . Aortic sclerosis   . Arthritis   . Blood transfusion   . Colon polyps    Hx of  adenomatous  . Depression   . Diverticulosis   . GERD (gastroesophageal reflux disease)   . Hemorrhoids, internal, with bleeding 01/08/2011  . Hypertension   . Insomnia   . Pneumonia   . Reflux     PAST SURGICAL HISTORY:   Past Surgical History:  Procedure Laterality Date  . CHOLECYSTECTOMY    . COLON SURGERY    . COLONOSCOPY  01/2011  . COLONOSCOPY  11/18/2011   Procedure: COLONOSCOPY;  Surgeon: Rob Buntinganiel Jacobs, MD;  Location: [facility];  Service: Endoscopy;  Laterality: N/A;  . COLONOSCOPY  11/24/2011   Procedure: COLONOSCOPY;  Surgeon: Erick BlinksJay Pyrtle, MD;  Location: [facility];  Service:  Gastroenterology;  Laterality: N/A;  . ESOPHAGOGASTRODUODENOSCOPY  11/18/2011   Procedure: ESOPHAGOGASTRODUODENOSCOPY (EGD);  Surgeon: Rob Buntinganiel Jacobs, MD;  [facility];  Service: Endoscopy;  Laterality: N/A;  . LOW ANTERIOR BOWEL RESECTION  1996   diverticulitis  . UPPER GASTROINTESTINAL ENDOSCOPY      SOCIAL HISTORY:   Social History   Social History  . Marital status: Married    Spouse name: N/A  . Number of children: N/A  . Years of education: N/A   Occupational History  . Unemployed  Homemaker   Social History Main Topics  . Smoking status: Never Smoker  . Smokeless tobacco: Never Used  . Alcohol use 0.0 oz/week    0 drink(s) per week     Comment: occasional  . Drug use: No  . Sexual activity: No   Other Topics Concern  . Not on file   Social History Narrative  . No narrative on file    FAMILY HISTORY:   Family Status  Relation Status  . Father Deceased   pancreatic cancer  . Mother Deceased   heart disease  . Sister Deceased   ALS  . Brother Deceased   kidney failure  . Brother Deceased  . Brother Alive   heart disease  . Daughter Alive  . Daughter Alive  . Daughter Alive  . Son Alive  . Son Deceased  . Neg Hx     ROS:  Decreased appetite.  No weight loss.  A complete 10 system review of systems was obtained and was unremarkable apart from what is mentioned above.  PHYSICAL EXAMINATION:    VITALS:   Vitals:   08/30/16 1309  BP: 120/70  Pulse: (!) 54  Weight: 153 lb (69.4 kg)  Height: 5\' 6"  (1.676 m)    GEN:  Normal appears female in no acute distress.  Appears stated age. HEENT:  Normocephalic, atraumatic. The mucous membranes are moist. The superficial temporal arteries are without ropiness or tenderness. Cardiovascular: Bradycardic with regular rhythm. Lungs: Clear to auscultation bilaterally. Neck/Heme: There are no carotid bruits noted bilaterally.  NEUROLOGICAL: Orientation:   Montreal Cognitive Assessment   08/30/2016  Visuospatial/ Executive (0/5) 2  Naming (0/3) 3  Attention: Read list of digits (0/2) 1  Attention: Read list of letters (0/1) 1  Attention: Serial 7 subtraction starting at 100 (0/3) 2  Language: Repeat phrase (0/2) 2  Language : Fluency (0/1) 1  Abstraction (0/2) 1  Delayed Recall (0/5) 0  Orientation (0/6) 4  Total 17  Adjusted Score (based on education) 18   (MoCA in 08/2015 was 14/30)  Cranial nerves: There is good facial symmetry.  Speech is fluent and clear. Soft palate rises symmetrically and there is no tongue deviation. 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  Tongue does not protrude outside of the mouth.  No tongue fasciculations. Blepharospasm is markedly improved from baseline.  Hearing is intact to conversational tone. Tone: Tone is good throughout. Sensation: Sensation is intact to light touch throughout. Coordination:  The patient has no difficulty with RAM's or FNF bilaterally. Motor: Strength is 5/5 in the bilateral upper and lower extremities.  Shoulder shrug is equal and symmetric. There is no pronator drift.  There are no fasciculations noted. Gait and Station: The patient is able to arise out of the chair without difficulty.  She walks well today, without ataxia.     IMPRESSION/PLAN  1. Meige syndrome  -She is doing well with Botox and will continue this.  Next injection 10/01/16. 2.  Dementia, likely dementia of the Alzheimer's type  -Long discussion with the patient and her family today.  Much greater than 50% of this 30 minute visit was spent in counseling with the patient and her family.  We discussed safety.  She is already in an assisted living, although independently.  She and I discussed the importance of engaging in activities at the assisted living facility.  We talked about safe, cardiovascular exercise, which she is not doing.  Stressed the importance of this  today.  We talked about the importance of learning  new activities.  Her MoCA date improved fairly significantly over the last year.  -Continue Aricept, 10 mg daily.  Talked about Namenda, but decided to hold on that given that she really has been doing quite well. 3.  Follow up is anticipated in the next year, sooner should new neurologic issues arise.  Much greater than 50% of this visit was spent in counseling with the patient and the family.  Total face to face time:  30 min

## 2016-08-30 ENCOUNTER — Encounter: Payer: Self-pay | Admitting: Neurology

## 2016-08-30 ENCOUNTER — Ambulatory Visit (INDEPENDENT_AMBULATORY_CARE_PROVIDER_SITE_OTHER): Payer: Medicare Other | Admitting: Neurology

## 2016-08-30 VITALS — BP 120/70 | HR 54 | Ht 66.0 in | Wt 153.0 lb

## 2016-08-30 DIAGNOSIS — G244 Idiopathic orofacial dystonia: Secondary | ICD-10-CM

## 2016-08-30 DIAGNOSIS — G301 Alzheimer's disease with late onset: Secondary | ICD-10-CM

## 2016-08-30 DIAGNOSIS — F0280 Dementia in other diseases classified elsewhere without behavioral disturbance: Secondary | ICD-10-CM | POA: Diagnosis not present

## 2016-10-01 ENCOUNTER — Ambulatory Visit (INDEPENDENT_AMBULATORY_CARE_PROVIDER_SITE_OTHER): Payer: Medicare Other | Admitting: Neurology

## 2016-10-01 DIAGNOSIS — G245 Blepharospasm: Secondary | ICD-10-CM | POA: Diagnosis not present

## 2016-10-01 DIAGNOSIS — G244 Idiopathic orofacial dystonia: Secondary | ICD-10-CM | POA: Diagnosis not present

## 2016-10-01 MED ORDER — ONABOTULINUMTOXINA 100 UNITS IJ SOLR
30.0000 [IU] | Freq: Once | INTRAMUSCULAR | Status: AC
  Administered 2016-10-01: 30 [IU] via INTRAMUSCULAR

## 2016-10-01 NOTE — Procedures (Signed)
[facility]   Procedure Note Botox  Attending: Dr. Lurena Joinerebecca Kimla Furth  Preoperative Diagnosis(es): Blepharospasm; Meige syndrome; oromandibular dystonia  Clinical notes:  Pt and husband very pleased with efficacy of botox.      Consent obtained from: The patient Benefits discussed included, but were not limited to ability to open eyes and therefore see better.  Risk discussed included, but were not limited pain and discomfort, bleeding, bruising, excessive weakness, venous thrombosis, muscle atrophy and drooping of eyelids.  A copy of the patient medication guide was given to the patient which explains the blackbox warning.  Patients identity and treatment sites confirmed Yes.  .  Details of Procedure: Skin was cleaned with alcohol.  A 30 gauge, 1/2 inch needle was introduced to the target muscle.  Prior to injection, the needle plunger was aspirated to make sure the needle was not within a blood vessel.  There was no blood retrieved on aspiration.    Following is a summary of the muscles injected  And the amount of Botulinum toxin used:   Dilution 0.9% preservative free saline mixed with 100 u Botox type A to make 5 U per 0.1cc (2 cc of saline used per 100 U botox)  Injections  Location Left  Right Units  Upper lateral orbicularis oculi  2.5 2.5 5.0  Upper medial orbicularis oculi  2.5 2.5 5.0  Lateral orbicularis oculi  5.0 5.0 10.0  Lower lateral orbicularis oculi  2.5 2.5 5.0  Lower medial orbicularis oculi 2.5 2.5 5.0  Trapezius          TOTAL UNITS:   30.0   Agent: Botulinum Type A ( Onobotulinum Toxin type A ).  1 vials of Botox were used, each containing 50 units and freshly diluted with 2 mL of sterile, non-perserved saline   Total injected (Units): 30  Total wasted (Units): 0   Pt tolerated procedure well without complications.   Reinjection is anticipated in 3 months.

## 2016-11-11 ENCOUNTER — Ambulatory Visit (INDEPENDENT_AMBULATORY_CARE_PROVIDER_SITE_OTHER): Payer: Medicare Other | Admitting: Podiatry

## 2016-11-11 ENCOUNTER — Encounter: Payer: Self-pay | Admitting: Podiatry

## 2016-11-11 VITALS — Ht 66.0 in | Wt 153.0 lb

## 2016-11-11 DIAGNOSIS — B351 Tinea unguium: Secondary | ICD-10-CM | POA: Diagnosis not present

## 2016-11-11 DIAGNOSIS — M79676 Pain in unspecified toe(s): Secondary | ICD-10-CM | POA: Diagnosis not present

## 2016-11-11 NOTE — Progress Notes (Signed)
Patient ID: Susan CanningCharlotte C Henry, female   DOB: [birth year], 83 y.o.   MRN: [id number] Complaint:  Visit Type: Patient returns to my office for continued preventative foot care services. Complaint: Patient states" my nails have grown long and thick and become painful to walk and wear shoes" . The patient presents for preventative foot care services. No changes to ROS  Podiatric Exam: Vascular: dorsalis pedis and posterior tibial pulses are palpable bilateral. Capillary return is immediate. Temperature gradient is WNL. Skin turgor WNL  Sensorium: Normal Semmes Weinstein monofilament test. Normal tactile sensation bilaterally. Nail Exam: Pt has thick disfigured discolored nails with subungual debris noted bilateral entire nail hallux through fifth toenails Ulcer Exam: There is no evidence of ulcer or pre-ulcerative changes or infection. Orthopedic Exam: Muscle tone and strength are WNL. No limitations in general ROM. No crepitus or effusions noted. Foot type and digits show no abnormalities. Bony prominences are unremarkable. Skin: No Porokeratosis. No infection or ulcers  Diagnosis:  Onychomycosis, , Pain in right toe, pain in left toes  Treatment & Plan Procedures and Treatment: Consent by patient was obtained for treatment procedures. The patient understood the discussion of treatment and procedures well. All questions were answered thoroughly reviewed. Debridement of mycotic and hypertrophic toenails, 1 through 5 bilateral and clearing of subungual debris. No ulceration, no infection noted.  Return Visit-Office Procedure: Patient instructed to return to the office for a follow up visit 3 months for continued evaluation and treatment.

## 2016-11-18 ENCOUNTER — Ambulatory Visit: Payer: Medicare Other | Admitting: Podiatry

## 2016-11-28 ENCOUNTER — Other Ambulatory Visit: Payer: Self-pay | Admitting: Neurology

## 2017-01-04 ENCOUNTER — Encounter: Payer: Medicare Other | Admitting: Podiatry

## 2017-01-05 ENCOUNTER — Ambulatory Visit (INDEPENDENT_AMBULATORY_CARE_PROVIDER_SITE_OTHER): Payer: Medicare Other | Admitting: Podiatry

## 2017-01-05 ENCOUNTER — Encounter: Payer: Self-pay | Admitting: Podiatry

## 2017-01-05 DIAGNOSIS — B351 Tinea unguium: Secondary | ICD-10-CM

## 2017-01-05 DIAGNOSIS — M79676 Pain in unspecified toe(s): Secondary | ICD-10-CM

## 2017-01-07 ENCOUNTER — Ambulatory Visit: Payer: Medicare Other | Admitting: Neurology

## 2017-01-07 DIAGNOSIS — Z029 Encounter for administrative examinations, unspecified: Secondary | ICD-10-CM

## 2017-01-10 ENCOUNTER — Encounter: Payer: Self-pay | Admitting: Neurology

## 2017-01-10 ENCOUNTER — Telehealth: Payer: Self-pay | Admitting: Neurology

## 2017-01-10 NOTE — Progress Notes (Signed)
Subjective:     Patient ID: Susan CanningCharlotte C Henry, female   DOB: [birth year], 83 y.o.   MRN: [id number]  HPI patient presents stating that her nails that become very painful thick and she cannot wear shoe gear comfortably   Review of Systems     Objective:   Physical Exam Neurovascular status intact negative Homans sign was noted with thick yellow brittle nailbeds 1-5 both feet that are painful    Assessment:     Mycotic nail infection with pain 1-5 both feet    Plan:     Debris painful nailbeds 1-5 both feet with no iatrogenic bleeding noted

## 2017-01-10 NOTE — Telephone Encounter (Signed)
[name] left a message she wants to reschedule missed botox appointment on Friday, should she be seen sooner or is Dr Don Perkingat's next botox day ok/[name]

## 2017-01-10 NOTE — Telephone Encounter (Signed)
Next Botox day is fine. Thank you.

## 2017-02-03 ENCOUNTER — Ambulatory Visit: Payer: Medicare Other | Admitting: Podiatry

## 2017-02-14 NOTE — Progress Notes (Signed)
This encounter was created in error - please disregard.

## 2017-02-18 ENCOUNTER — Ambulatory Visit (INDEPENDENT_AMBULATORY_CARE_PROVIDER_SITE_OTHER): Payer: Medicare Other | Admitting: Neurology

## 2017-02-18 DIAGNOSIS — G244 Idiopathic orofacial dystonia: Secondary | ICD-10-CM

## 2017-02-18 DIAGNOSIS — G245 Blepharospasm: Secondary | ICD-10-CM

## 2017-02-18 MED ORDER — ONABOTULINUMTOXINA 100 UNITS IJ SOLR
30.0000 [IU] | Freq: Once | INTRAMUSCULAR | Status: AC
  Administered 2017-02-18: 30 [IU] via INTRAMUSCULAR

## 2017-02-18 NOTE — Procedures (Signed)
[facility]   Procedure Note Botox  Attending: Dr. Rebecca Tat  Preoperative Diagnosis(es): Blepharospasm; Meige syndrome; oromandibular dystonia  Clinical notes:  Pt and husband very pleased with efficacy of botox.      Consent obtained from: The patient Benefits discussed included, but were not limited to ability to open eyes and therefore see better.  Risk discussed included, but were not limited pain and discomfort, bleeding, bruising, excessive weakness, venous thrombosis, muscle atrophy and drooping of eyelids.  A copy of the patient medication guide was given to the patient which explains the blackbox warning.  Patients identity and treatment sites confirmed Yes.  .  Details of Procedure: Skin was cleaned with alcohol.  A 30 gauge, 1/2 inch needle was introduced to the target muscle.  Prior to injection, the needle plunger was aspirated to make sure the needle was not within a blood vessel.  There was no blood retrieved on aspiration.    Following is a summary of the muscles injected  And the amount of Botulinum toxin used:   Dilution 0.9% preservative free saline mixed with 100 u Botox type A to make 5 U per 0.1cc (2 cc of saline used per 100 U botox)  Injections  Location Left  Right Units  Upper lateral orbicularis oculi  2.5 2.5 5.0  Upper medial orbicularis oculi  2.5 2.5 5.0  Lateral orbicularis oculi  5.0 5.0 10.0  Lower lateral orbicularis oculi  2.5 2.5 5.0  Lower medial orbicularis oculi 2.5 2.5 5.0  Trapezius          TOTAL UNITS:   30.0   Agent: Botulinum Type A ( Onobotulinum Toxin type A ).  1 vials of Botox were used, each containing 50 units and freshly diluted with 2 mL of sterile, non-perserved saline   Total injected (Units): 30  Total wasted (Units): 0   Pt tolerated procedure well without complications.   Reinjection is anticipated in 3 months.  

## 2017-03-31 ENCOUNTER — Ambulatory Visit: Payer: Medicare Other | Admitting: Neurology

## 2017-04-06 ENCOUNTER — Ambulatory Visit: Payer: Medicare Other | Admitting: Podiatry

## 2017-05-19 ENCOUNTER — Other Ambulatory Visit: Payer: Self-pay | Admitting: Neurology

## 2017-05-19 MED ORDER — DONEPEZIL HCL 10 MG PO TABS: 10.0000 mg | ORAL_TABLET | Freq: Every day | ORAL | 5 refills | Status: DC

## 2017-05-19 NOTE — Addendum Note (Signed)
Addended bySilvio Pate: MCCRACKEN, JADE L on: 05/19/2017 08:47 AM   Modules accepted: Orders

## 2017-05-26 ENCOUNTER — Other Ambulatory Visit: Payer: Self-pay | Admitting: Neurology

## 2017-05-26 ENCOUNTER — Telehealth: Payer: Self-pay | Admitting: Neurology

## 2017-05-26 MED ORDER — DONEPEZIL HCL 10 MG PO TABS: 10.0000 mg | ORAL_TABLET | Freq: Every day | ORAL | 5 refills | Status: DC

## 2017-05-26 NOTE — Telephone Encounter (Signed)
I have sent new RX.  Please let pt know

## 2017-05-26 NOTE — Telephone Encounter (Signed)
Attempted to call pt. Line was busy.

## 2017-05-26 NOTE — Telephone Encounter (Signed)
Patient changed pharmacy [facility] to [facility] [address]. He states that she needs the Donepezil called into the [facility]

## 2017-05-27 ENCOUNTER — Ambulatory Visit (INDEPENDENT_AMBULATORY_CARE_PROVIDER_SITE_OTHER): Payer: Medicare Other | Admitting: Neurology

## 2017-05-27 DIAGNOSIS — G244 Idiopathic orofacial dystonia: Secondary | ICD-10-CM

## 2017-05-27 DIAGNOSIS — G245 Blepharospasm: Secondary | ICD-10-CM | POA: Diagnosis not present

## 2017-05-27 NOTE — Procedures (Signed)
[facility]   Procedure Note Botox  Attending: Dr. Lurena Joinerebecca Tyrae Alcoser  Preoperative Diagnosis(es): Blepharospasm; Meige syndrome; oromandibular dystonia  Clinical notes:  Pt and husband very pleased with efficacy of botox.      Consent obtained from: The patient Benefits discussed included, but were not limited to ability to open eyes and therefore see better.  Risk discussed included, but were not limited pain and discomfort, bleeding, bruising, excessive weakness, venous thrombosis, muscle atrophy and drooping of eyelids.  A copy of the patient medication guide was given to the patient which explains the blackbox warning.  Patients identity and treatment sites confirmed Yes.  .  Details of Procedure: Skin was cleaned with alcohol.  A 30 gauge, 1/2 inch needle was introduced to the target muscle.  Prior to injection, the needle plunger was aspirated to make sure the needle was not within a blood vessel.  There was no blood retrieved on aspiration.    Following is a summary of the muscles injected  And the amount of Botulinum toxin used:   Dilution 0.9% preservative free saline mixed with 100 u Botox type A to make 5 U per 0.1cc (2 cc of saline used per 100 U botox)  Injections  Location Left  Right Units  Upper lateral orbicularis oculi  2.5 2.5 5.0  Upper medial orbicularis oculi  2.5 2.5 5.0  Lateral orbicularis oculi  5.0 5.0 10.0  Lower lateral orbicularis oculi  2.5 2.5 5.0  Lower medial orbicularis oculi 2.5 2.5 5.0  Trapezius          TOTAL UNITS:   30.0   Agent: Botulinum Type A ( Onobotulinum Toxin type A ).  1 vials of Botox were used, each containing 50 units and freshly diluted with 2 mL of sterile, non-perserved saline   Total injected (Units): 30  Total wasted (Units): 30   Pt tolerated procedure well without complications.   Reinjection is anticipated in 3 months.

## 2017-05-27 NOTE — Telephone Encounter (Signed)
Spoke with Molly Maduroobert, relaying message below

## 2017-05-30 ENCOUNTER — Other Ambulatory Visit: Payer: Self-pay | Admitting: Neurology

## 2017-05-30 MED ORDER — DONEPEZIL HCL 10 MG PO TABS: 10.0000 mg | ORAL_TABLET | Freq: Every day | ORAL | 5 refills | Status: DC

## 2017-08-26 ENCOUNTER — Telehealth: Payer: Self-pay | Admitting: Neurology

## 2017-08-26 ENCOUNTER — Ambulatory Visit: Payer: Medicare Other | Admitting: Neurology

## 2017-08-26 NOTE — Telephone Encounter (Signed)
Patient's husband called and said they were on their way and that she got sick. They had to go back home. He would like to reschedule today's Botox appointment. If you let me know what day, I can call him back. Thanks

## 2017-08-26 NOTE — Telephone Encounter (Signed)
We can see her on 10/07/17 at 10:15 am for Botox. Can you let the patient know please?

## 2017-08-26 NOTE — Telephone Encounter (Signed)
Yes I called the patient and gave them the new Botox appointment time. Thanks

## 2017-08-26 NOTE — Progress Notes (Signed)
Susan CanningCharlotte C Henry was seen today in neurologic consultation at the request of Shaune PollackGates, Donna, MD.  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  Sx's have been going on for several years (30 years per patient that she could feel it) but have been worse over the last year (family could visibly see it over the last year at least).  It started with tightness across the forehead and it has gotten worse over the last year, progressing to involve movements of the entire face.  She forefully blinks the eyes and will involuntarily smile and move the tongue about in the mouth.  No new medication changes.  Daughter states that PCP felt Celexa could contribute and they are currently weaning the medication.  No change in the sx's at all.  No other exposure to antipsychotics as far as she can remember.  No exposure to reglan.  Daughter notes changing in breathing pattern; has a staggering breathering pattern, more like a panting than a regular, fluid breathing pattern.  She does not feel SOB.  No hypophonia.  Some memory change per family.  No loss of bladder control but may have loss of bowel control depending on what she eats.  Will occasionally cough with eating but otherwise seems to swallow okay.  Balance is not as good as it was but has had a few falls over the last few years.  [facility].  She seems to drift to one direction when walking.  She did some therapy that helped about 6 months ago and that helped until it ended.  She does not exercise otherwise.  Sunlight seems to make the sx's in the eyes worse.    04/24/15 update:  Pt returns today with her daughter and husband, who supplements the history.  They are here to discuss memory change.  Pt doesn't recognize memory change but husband states that it has been going downhill for a few years.  She quit driving 3-4 years ago because "I  just did not have my re-newed."  Her daughter puts her meds in a pill box x 2 years; prior to that her husband did that task.  Her daughter does the monthly finances because they just weren't getting done.  [facility] and the cooking is done for them.  She is weaning off of the prilosec currently.  She states that she had some reflux last night.  They are trying to take it off as they were worried about its affect on memory.  No fam hx of memory problems.  08/28/15 update:  The patient returns today for follow-up, accompanied by her husband and daughter who supplement the history.  In July, we started the patient on Aricept, 5 mg daily for a month and then worked to 10 mg daily.  She is tolerating the medication well.  She states that mood has been good.  Memory has been stable.   Her last Botox for Meige syndrome/blepharospasm was on 06/27/2015.  She does much better with the medication and is able to hold her eyes open much more easily.  02/26/16 update:  The patient returns today for follow-up, accompanied by her husband and daughter who supplement the history.  The patient has a hx of dementia and is on Aricept 10 mg daily.   She is tolerating the medication well.  Daughter thinks that she is pretty stable  but her husband thinks that she is slipping some, particularly with current events and remembering appts.  She is not exercising.   Her last Botox for Meige syndrome/blepharospasm was on 01/23/16.  She does much better with the medication and is able to hold her eyes open much more easily.  08/30/16 update:  The patient returns today, accompanied by her husband who supplements the history.  She remains on Aricept for dementia.  She tolerates it well.  No diarrhea.  No significant bradycardic events.  She is not exercising.  Husband helps her to manage her medications.  She does not drive.  She is doing frequent crossword puzzles.  Doing well with Botox for blepharospasm/Meige syndrome.   No swallowing problems.  08/30/17 update: Patient seen today in follow-up for her dementia, accompanied by her husband who supplements history.  I thought I was going to see her last Friday for Botox for Meige syndrome but she got sick.  She is feeling better now.  She is still on Aricept.  Her memory has been about the same.  Her husband does all of the driving and finances in the home.  He helps her with her medications.  She is able to carry on conversation with him.  She has no hallucinations.  States that she is reading and doing puzzles to keep busy.   She is living in independent living but has food cooked and is social when she is dining.  No falls.   Has intermittent diarrhea but husband states that it is food related; pork bothers her stomach.    MRI of the brain dated 05/07/2014 demonstrated mild atrophy, especially posteriorly, and mild small vessel disease.  I reviewed this with the patient and her family.  PREVIOUS MEDICATIONS: n/a  ALLERGIES:   Allergies  Allergen Reactions  . Alprazolam Rash    CURRENT MEDICATIONS:  Current Outpatient Medications on File Prior to Visit  Medication Sig Dispense Refill  . alendronate (FOSAMAX) 70 MG tablet Take 70 mg by mouth once a week.   1  . amLODipine (NORVASC) 10 MG tablet   0  . bisoprolol (ZEBETA) 5 MG tablet Take 5 mg by mouth daily.     . citalopram (CELEXA) 20 MG tablet Take 10 mg by mouth every other day.     . donepezil (ARICEPT) 10 MG tablet Take 1 tablet (10 mg total) by mouth at bedtime. 30 tablet 5  . furosemide (LASIX) 20 MG tablet Take 20 mg by mouth daily.     . Glucosamine-Chondroitin (GLUCOSAMINE CHONDR COMPLEX PO) Take by mouth daily.    Marland Kitchen lisinopril (PRINIVIL,ZESTRIL) 20 MG tablet     . vitamin C (ASCORBIC ACID) 500 MG tablet Take 500 mg by mouth daily.     No current facility-administered medications on file prior to visit.     PAST MEDICAL HISTORY:   Past Medical History:  Diagnosis Date  . Allergy    Seasonal   . Anemia    Iron deficiency   . Anxiety   . Aortic sclerosis   . Arthritis   . Blood transfusion   . Colon polyps    Hx of  adenomatous  . Depression   . Diverticulosis   . GERD (gastroesophageal reflux disease)   . Hemorrhoids, internal, with bleeding 01/08/2011  . Hypertension   . Insomnia   . Pneumonia   . Reflux     PAST SURGICAL HISTORY:   Past Surgical History:  Procedure Laterality Date  . CHOLECYSTECTOMY    .  COLON SURGERY    . COLONOSCOPY  01/2011  . LOW ANTERIOR BOWEL RESECTION  1996   diverticulitis  . UPPER GASTROINTESTINAL ENDOSCOPY      SOCIAL HISTORY:   Social History   Socioeconomic History  . Marital status: Married    Spouse name: Not on file  . Number of children: Not on file  . Years of education: Not on file  . Highest education level: Not on file  Social Needs  . Financial resource strain: Not on file  . Food insecurity - worry: Not on file  . Food insecurity - inability: Not on file  . Transportation needs - medical: Not on file  . Transportation needs - non-medical: Not on file  Occupational History  . Occupation: Unemployed     Comment: Homemaker  Tobacco Use  . Smoking status: Never Smoker  . Smokeless tobacco: Never Used  Substance and Sexual Activity  . Alcohol use: Yes    Alcohol/week: 0.0 oz    Comment: occasional  . Drug use: No  . Sexual activity: No  Other Topics Concern  . Not on file  Social History Narrative  . Not on file    FAMILY HISTORY:   Family Status  Relation Name Status  . Father  Deceased       pancreatic cancer  . Mother  Deceased       heart disease  . Sister  Deceased       ALS  . Brother  Deceased       kidney failure  . Brother  Deceased  . Brother  Alive       heart disease  . Daughter  Alive  . Daughter  Alive  . Daughter  Alive  . Son  Alive  . Son  Deceased  . Neg Hx  (Not Specified)    ROS:  Decreased appetite.  No weight loss.  A complete 10 system review of systems was  obtained and was unremarkable apart from what is mentioned above.  PHYSICAL EXAMINATION:    VITALS:   Vitals:   08/30/17 1306  BP: 110/60  Pulse: 60  SpO2: 97%  Weight: 153 lb (69.4 kg)  Height: 5\' 6"  (1.676 m)    GEN:  Normal appears female in no acute distress.  Appears stated age.  Has some pseudobulbar laughter HEENT:  Normocephalic, atraumatic. The mucous membranes are moist. The superficial temporal arteries are without ropiness or tenderness. Cardiovascular: Bradycardic with regular rhythm. Lungs: occasional exp wheezes.    She has pursed lip breathing Neck/Heme: There are no carotid bruits noted bilaterally.  NEUROLOGICAL: Orientation:   Montreal Cognitive Assessment  08/30/2017 08/30/2016  Visuospatial/ Executive (0/5) 1 2  Naming (0/3) 3 3  Attention: Read list of digits (0/2) 2 1  Attention: Read list of letters (0/1) 1 1  Attention: Serial 7 subtraction starting at 100 (0/3) 0 2  Language: Repeat phrase (0/2) 2 2  Language : Fluency (0/1) 1 1  Abstraction (0/2) 1 1  Delayed Recall (0/5) 0 0  Orientation (0/6) 2 4  Total 13 17  Adjusted Score (based on education) 14 18   (MoCA in 08/2015 was 14/30)  Cranial nerves: There is good facial symmetry.  Speech is fluent and clear. Soft palate rises symmetrically and there is no tongue deviation. There is very little oromandibular dystonia today.    Tongue does not protrude outside of the mouth.  No tongue fasciculations. No blepharospasm today.  Hearing is intact to conversational  tone. Tone: Tone is good throughout. Sensation: Sensation is intact to light touch throughout. Coordination:  The patient has no difficulty with RAM's or FNF bilaterally. Motor: Strength is 5/5 in the bilateral upper and lower extremities.  Shoulder shrug is equal and symmetric. There is no pronator drift.  There are no fasciculations noted. Gait and Station: The patient has a wide based gait.  She is mildly unsteady.      IMPRESSION/PLAN  1. Meige syndrome  -She is doing well with Botox and will continue this.  Next injection 10/07/17 2.  Dementia, likely dementia of the Alzheimer's type  -Long discussion with the patient and her family today.  Much greater than 50% of this 30 minute visit was spent in counseling with the patient and her family.  We discussed safety.  She is already in an assisted living, although independently.  She and I discussed the importance of engaging in activities at the assisted living facility.  We talked about safe, cardiovascular exercise, which she is not doing.   -Continue Aricept, 10 mg daily.  Talked about Namenda, but decided to hold on that given that she really has been doing quite well. 3.  Follow up is anticipated in the next year, sooner should new neurologic issues arise. botox next month

## 2017-08-30 ENCOUNTER — Ambulatory Visit (INDEPENDENT_AMBULATORY_CARE_PROVIDER_SITE_OTHER): Payer: Medicare Other | Admitting: Neurology

## 2017-08-30 ENCOUNTER — Encounter: Payer: Self-pay | Admitting: Neurology

## 2017-08-30 VITALS — BP 110/60 | HR 60 | Ht 66.0 in | Wt 153.0 lb

## 2017-08-30 DIAGNOSIS — F0280 Dementia in other diseases classified elsewhere without behavioral disturbance: Secondary | ICD-10-CM | POA: Diagnosis not present

## 2017-08-30 DIAGNOSIS — G244 Idiopathic orofacial dystonia: Secondary | ICD-10-CM | POA: Diagnosis not present

## 2017-08-30 DIAGNOSIS — G301 Alzheimer's disease with late onset: Secondary | ICD-10-CM | POA: Diagnosis not present

## 2017-10-07 ENCOUNTER — Ambulatory Visit (INDEPENDENT_AMBULATORY_CARE_PROVIDER_SITE_OTHER): Payer: Medicare Other | Admitting: Neurology

## 2017-10-07 DIAGNOSIS — G245 Blepharospasm: Secondary | ICD-10-CM | POA: Diagnosis not present

## 2017-10-07 DIAGNOSIS — G244 Idiopathic orofacial dystonia: Secondary | ICD-10-CM

## 2017-10-07 MED ORDER — ONABOTULINUMTOXINA 100 UNITS IJ SOLR
30.0000 [IU] | Freq: Once | INTRAMUSCULAR | Status: AC
  Administered 2017-10-07: 30 [IU] via INTRAMUSCULAR

## 2017-10-07 NOTE — Procedures (Signed)
[facility]   Procedure Note Botox  Attending: Dr. Niylah Hassan  Preoperative Diagnosis(es): Blepharospasm; Meige syndrome; oromandibular dystonia  Clinical notes:  Pt and husband very pleased with efficacy of botox.      Consent obtained from: The patient Benefits discussed included, but were not limited to ability to open eyes and therefore see better.  Risk discussed included, but were not limited pain and discomfort, bleeding, bruising, excessive weakness, venous thrombosis, muscle atrophy and drooping of eyelids.  A copy of the patient medication guide was given to the patient which explains the blackbox warning.  Patients identity and treatment sites confirmed Yes.  .  Details of Procedure: Skin was cleaned with alcohol.  A 30 gauge, 1/2 inch needle was introduced to the target muscle.  Prior to injection, the needle plunger was aspirated to make sure the needle was not within a blood vessel.  There was no blood retrieved on aspiration.    Following is a summary of the muscles injected  And the amount of Botulinum toxin used:   Dilution 0.9% preservative free saline mixed with 100 u Botox type A to make 5 U per 0.1cc (2 cc of saline used per 100 U botox)  Injections  Location Left  Right Units  Upper lateral orbicularis oculi  2.5 2.5 5.0  Upper medial orbicularis oculi  2.5 2.5 5.0  Lateral orbicularis oculi  5.0 5.0 10.0  Lower lateral orbicularis oculi  2.5 2.5 5.0  Lower medial orbicularis oculi 2.5 2.5 5.0  Trapezius          TOTAL UNITS:   30.0   Agent: Botulinum Type A ( Onobotulinum Toxin type A ).  1 vials of Botox were used, each containing 50 units and freshly diluted with 2 mL of sterile, non-perserved saline   Total injected (Units): 30  Total wasted (Units): 0   Pt tolerated procedure well without complications.   Reinjection is anticipated in 3 months.  

## 2017-11-11 ENCOUNTER — Other Ambulatory Visit: Payer: Self-pay | Admitting: Neurology

## 2017-11-25 ENCOUNTER — Ambulatory Visit: Payer: Medicare Other | Admitting: Neurology

## 2018-01-06 ENCOUNTER — Ambulatory Visit: Payer: Medicare Other | Admitting: Neurology

## 2018-01-10 ENCOUNTER — Ambulatory Visit
Admission: RE | Admit: 2018-01-10 | Discharge: 2018-01-10 | Disposition: A | Discharge status: Home / Self Care | Payer: Medicare Other | Source: Ambulatory Visit | Attending: Family Medicine | Admitting: Family Medicine

## 2018-01-10 ENCOUNTER — Other Ambulatory Visit: Payer: Self-pay | Admitting: Family Medicine

## 2018-01-10 DIAGNOSIS — R531 Weakness: Secondary | ICD-10-CM

## 2018-01-10 DIAGNOSIS — R05 Cough: Secondary | ICD-10-CM

## 2018-01-10 DIAGNOSIS — R059 Cough, unspecified: Secondary | ICD-10-CM

## 2018-01-10 DIAGNOSIS — R0989 Other specified symptoms and signs involving the circulatory and respiratory systems: Secondary | ICD-10-CM

## 2018-01-12 ENCOUNTER — Ambulatory Visit (INDEPENDENT_AMBULATORY_CARE_PROVIDER_SITE_OTHER): Payer: Medicare Other | Admitting: Neurology

## 2018-01-12 DIAGNOSIS — G245 Blepharospasm: Secondary | ICD-10-CM

## 2018-01-12 DIAGNOSIS — G244 Idiopathic orofacial dystonia: Secondary | ICD-10-CM

## 2018-01-12 MED ORDER — ONABOTULINUMTOXINA 100 UNITS IJ SOLR
30.0000 [IU] | Freq: Once | INTRAMUSCULAR | Status: AC
  Administered 2018-01-12: 30 [IU] via INTRAMUSCULAR

## 2018-01-12 NOTE — Procedures (Signed)
[facility]   Procedure Note Botox  Attending: Dr. Neiko Trivedi  Preoperative Diagnosis(es): Blepharospasm; Meige syndrome; oromandibular dystonia  Clinical notes:  Pt and husband very pleased with efficacy of botox.      Consent obtained from: The patient Benefits discussed included, but were not limited to ability to open eyes and therefore see better.  Risk discussed included, but were not limited pain and discomfort, bleeding, bruising, excessive weakness, venous thrombosis, muscle atrophy and drooping of eyelids.  A copy of the patient medication guide was given to the patient which explains the blackbox warning.  Patients identity and treatment sites confirmed Yes.  .  Details of Procedure: Skin was cleaned with alcohol.  A 30 gauge, 1/2 inch needle was introduced to the target muscle.  Prior to injection, the needle plunger was aspirated to make sure the needle was not within a blood vessel.  There was no blood retrieved on aspiration.    Following is a summary of the muscles injected  And the amount of Botulinum toxin used:   Dilution 0.9% preservative free saline mixed with 100 u Botox type A to make 5 U per 0.1cc (2 cc of saline used per 100 U botox)  Injections  Location Left  Right Units  Upper lateral orbicularis oculi  2.5 2.5 5.0  Upper medial orbicularis oculi  2.5 2.5 5.0  Lateral orbicularis oculi  5.0 5.0 10.0  Lower lateral orbicularis oculi  2.5 2.5 5.0  Lower medial orbicularis oculi 2.5 2.5 5.0  Trapezius          TOTAL UNITS:   30.0   Agent: Botulinum Type A ( Onobotulinum Toxin type A ).  1 vials of Botox were used, each containing 50 units and freshly diluted with 2 mL of sterile, non-perserved saline   Total injected (Units): 30  Total wasted (Units): 0   Pt tolerated procedure well without complications.   Reinjection is anticipated in 3 months.  

## 2018-04-13 ENCOUNTER — Ambulatory Visit: Payer: Medicare Other | Admitting: Neurology

## 2018-05-05 ENCOUNTER — Ambulatory Visit: Payer: Medicare Other | Admitting: Neurology

## 2018-07-07 ENCOUNTER — Ambulatory Visit: Payer: Medicare Other | Admitting: Neurology

## 2018-11-24 ENCOUNTER — Telehealth: Payer: Self-pay | Admitting: Internal Medicine

## 2018-11-24 NOTE — Telephone Encounter (Signed)
Patient's daughter reports that her mother called her this am after passing a large amount of blood, Blood was bright red.  She reports that " I feel burpy".  Patient denies CP, SOB, light headed or dizziness.  She has not had any additional bleeding this am.  Patient's daughter advised that if her mom has any more bleeding she should take her to the ED for evaluation.  Patient has a history of diverticular bleed in 2013.  Patient's daughter verbalized understanding.

## 2019-09-03 ENCOUNTER — Other Ambulatory Visit: Payer: Self-pay | Admitting: Family Medicine

## 2019-09-03 DIAGNOSIS — M810 Age-related osteoporosis without current pathological fracture: Secondary | ICD-10-CM

## 2019-12-05 NOTE — Progress Notes (Signed)
Virtual Visit via Video Note The purpose of this virtual visit is to provide medical care while limiting exposure to the novel coronavirus.    Consent was obtained for video visit:  Yes.   Answered questions that patient had about telehealth interaction:  Yes.   I discussed the limitations, risks, security and privacy concerns of performing an evaluation and management service by telemedicine. I also discussed with the patient that there may be a patient responsible charge related to this service. The patient expressed understanding and agreed to proceed.  Pt location: Home Physician Location: office Name of referring provider:  Shon Hale, * I connected with Susan Henry at patients initiation/request on 12/07/2019 at  2:30 PM EST by video enabled telemedicine application and verified that I am speaking with the correct person using two identifiers. Pt MRN:  [id number] Pt DOB:  04-May-1925 Video Participants:  Susan Henry;  Daughter supplements hx   History of Present Illness:  Patient seen today in follow-up for meige syndrome.  Patient is to do Botox for this, has not been seen in about 2 years.  She had several no-shows to appointments for Botox, and ultimately was decided that there is just not worth proceeding with the injections.  Daughter states that she had actually quit doing the movements/bleph for quite some time and so they opted to hold on the botox for a while.  It seems like she is having a little more trouble with it lately.  No trouble with functional blindness.  Not bothersome to the patient but daughter notes that it is bothersome to the daughter for the patient and daughter wonders if it may be bothersome to the patient and pt cannot communicate that out.    Current Outpatient Medications on File Prior to Visit  Medication Sig Dispense Refill  . alendronate (FOSAMAX) 70 MG tablet Take 70 mg by mouth once a week.   1  . amLODipine (NORVASC) 10 MG  tablet   0  . bisoprolol (ZEBETA) 5 MG tablet Take 5 mg by mouth daily.     . citalopram (CELEXA) 20 MG tablet Take 10 mg by mouth every other day.     . donepezil (ARICEPT) 10 MG tablet Take 1 tablet (10 mg total) by mouth at bedtime. 30 tablet 5  . donepezil (ARICEPT) 10 MG tablet TAKE 1 TABLET(10 MG) BY MOUTH AT BEDTIME 30 tablet 5  . furosemide (LASIX) 20 MG tablet Take 20 mg by mouth daily.     . Glucosamine-Chondroitin (GLUCOSAMINE CHONDR COMPLEX PO) Take by mouth daily.    Marland Kitchen lisinopril (PRINIVIL,ZESTRIL) 20 MG tablet     . vitamin C (ASCORBIC ACID) 500 MG tablet Take 500 mg by mouth daily.     No current facility-administered medications on file prior to visit.     Observations/Objective:   There were no vitals filed for this visit. GEN:  The patient appears stated age and is in NAD.  Neurological examination:  Orientation: The patient is alert.  Follows simple commands Cranial nerves: There is good facial symmetry. There is no facial hypomimia.  The speech is fluent and clear. Soft palate rises symmetrically and there is no tongue deviation. Hearing is intact to conversational tone. Motor: Strength is at least antigravity x 4.   Shoulder shrug is equal and symmetric.  There is no pronator drift.  Movement examination: Tone: unable Abnormal movements: Patient has platysma spasm.  She has some oromandibular movements.  Did not see a  lot of spasm around the eyes today. Gait and Station: The patient pushes off of her couch to arise.  She is tenuous, but actually walks quite well in her home.      Assessment and Plan:   1.  Meige syndrome  -Patient actually looked fairly well today.  After some discussion, I did not recommend that we continue Botox.  We have not done it for almost 2 years, and I did not see a lot of blepharospasm.  She has some oral mandibular dystonia, but it really is not that bothersome to the patient and I would recommend leaving that alone for right now.   Patient and family were agreeable.  We can readdress this in the future if it becomes more bothersome.  Follow Up Instructions:  As needed  -I discussed the assessment and treatment plan with the patient. The patient was provided an opportunity to ask questions and all were answered. The patient agreed with the plan and demonstrated an understanding of the instructions.   The patient was advised to call back or seek an in-person evaluation if the symptoms worsen or if the condition fails to improve as anticipated.    Total time spent on today's visit was 15 minutes, including both face-to-face time and nonface-to-face time.  Time included that spent on review of records (prior notes available to me/labs/imaging if pertinent), discussing treatment and goals, answering patient's questions and coordinating care.   Alonza Bogus, DO

## 2019-12-07 ENCOUNTER — Encounter: Payer: Self-pay | Admitting: Neurology

## 2019-12-07 ENCOUNTER — Telehealth (INDEPENDENT_AMBULATORY_CARE_PROVIDER_SITE_OTHER): Payer: Medicare Other | Admitting: Neurology

## 2019-12-07 ENCOUNTER — Other Ambulatory Visit: Payer: Self-pay

## 2019-12-07 DIAGNOSIS — G244 Idiopathic orofacial dystonia: Secondary | ICD-10-CM

## 2020-02-14 ENCOUNTER — Other Ambulatory Visit: Payer: Medicare Other

## 2020-06-25 ENCOUNTER — Ambulatory Visit
Admission: RE | Admit: 2020-06-25 | Discharge: 2020-06-25 | Disposition: A | Discharge status: Home / Self Care | Payer: Medicare Other | Source: Ambulatory Visit | Attending: Family Medicine | Admitting: Family Medicine

## 2020-06-25 ENCOUNTER — Other Ambulatory Visit: Payer: Self-pay

## 2020-06-25 DIAGNOSIS — M810 Age-related osteoporosis without current pathological fracture: Secondary | ICD-10-CM

## 2021-09-22 ENCOUNTER — Emergency Department (HOSPITAL_COMMUNITY)
Admission: EM | Admit: 2021-09-22 | Discharge: 2021-09-22 | Disposition: A | Discharge status: Home / Self Care | Payer: Medicare Other | Attending: Emergency Medicine | Admitting: Emergency Medicine

## 2021-09-22 ENCOUNTER — Other Ambulatory Visit: Payer: Self-pay

## 2021-09-22 ENCOUNTER — Emergency Department (HOSPITAL_COMMUNITY): Payer: Medicare Other

## 2021-09-22 ENCOUNTER — Encounter (HOSPITAL_COMMUNITY): Payer: Self-pay | Admitting: Radiology

## 2021-09-22 DIAGNOSIS — Z79899 Other long term (current) drug therapy: Secondary | ICD-10-CM | POA: Diagnosis not present

## 2021-09-22 DIAGNOSIS — Y92090 Kitchen in other non-institutional residence as the place of occurrence of the external cause: Secondary | ICD-10-CM | POA: Insufficient documentation

## 2021-09-22 DIAGNOSIS — N390 Urinary tract infection, site not specified: Secondary | ICD-10-CM | POA: Diagnosis not present

## 2021-09-22 DIAGNOSIS — W19XXXA Unspecified fall, initial encounter: Secondary | ICD-10-CM

## 2021-09-22 DIAGNOSIS — Y9389 Activity, other specified: Secondary | ICD-10-CM | POA: Insufficient documentation

## 2021-09-22 DIAGNOSIS — I10 Essential (primary) hypertension: Secondary | ICD-10-CM | POA: Insufficient documentation

## 2021-09-22 DIAGNOSIS — S43005A Unspecified dislocation of left shoulder joint, initial encounter: Secondary | ICD-10-CM | POA: Diagnosis not present

## 2021-09-22 DIAGNOSIS — W010XXA Fall on same level from slipping, tripping and stumbling without subsequent striking against object, initial encounter: Secondary | ICD-10-CM | POA: Diagnosis not present

## 2021-09-22 DIAGNOSIS — S4992XA Unspecified injury of left shoulder and upper arm, initial encounter: Secondary | ICD-10-CM | POA: Diagnosis present

## 2021-09-22 LAB — URINALYSIS, ROUTINE W REFLEX MICROSCOPIC
Bacteria, UA: NONE SEEN
Bilirubin Urine: NEGATIVE
Glucose, UA: NEGATIVE mg/dL
Ketones, ur: NEGATIVE mg/dL
Nitrite: NEGATIVE
Protein, ur: NEGATIVE mg/dL
Specific Gravity, Urine: 1.02 (ref 1.005–1.030)
WBC, UA: >50 WBC/hpf — ABNORMAL HIGH (ref 0–5)
pH: 7 (ref 5.0–8.0)

## 2021-09-22 MED ORDER — FENTANYL CITRATE PF 50 MCG/ML IJ SOSY
50.0000 ug | PREFILLED_SYRINGE | Freq: Once | INTRAMUSCULAR | Status: AC
  Administered 2021-09-22: 50 ug via INTRAVENOUS
  Filled 2021-09-22: qty 1

## 2021-09-22 MED ORDER — FOSFOMYCIN TROMETHAMINE 3 G PO PACK
3.0000 g | PACK | Freq: Once | ORAL | Status: AC
  Administered 2021-09-22: 3 g via ORAL
  Filled 2021-09-22: qty 3

## 2021-09-22 MED ORDER — FENTANYL CITRATE PF 50 MCG/ML IJ SOSY
12.5000 ug | PREFILLED_SYRINGE | Freq: Once | INTRAMUSCULAR | Status: AC
  Administered 2021-09-22: 12.5 ug via INTRAVENOUS
  Filled 2021-09-22: qty 1

## 2021-09-22 NOTE — ED Provider Notes (Signed)
[facility]-EMERGENCY DEPT Provider Note   CSN: [id number] Arrival date & time: 09/22/21  1817     History Chief Complaint  Patient presents with   Fall   Shoulder Injury    Susan Henry is a 87 y.o. female.  HPI Patient presents after fall.  She has very poor hearing but seems to relate her own history appropriately.  She notes that she was in the kitchen, stumbled, fell, injuring her left shoulder.  She initially denies pain anywhere else, but with physical exam states that she does have some pain in her left superior posterior hip.  She denies head pain, neck pain.  Pain in left shoulder sharp, severe.  No loss of consciousness, no inability to move her hands bilaterally.  She was in her usual state of health prior to the fall.  Per EMS the patient had obvious deformity of the left shoulder, had a sling placed on the scene.  No EMS report of hypotension, confusion, other complaints.    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   Anemia due to acute blood loss 08/09/2012   Hypotension 07/31/2012   Diverticulosis of colon (without mention of hemorrhage) 11/18/2011   Hypertension 11/16/2011   GI hemorrhage 11/16/2011   Hemorrhoids, internal, with bleeding 01/08/2011   Iron deficiency anemia, unspecified 01/08/2011    Past Surgical History:  Procedure Laterality Date   CHOLECYSTECTOMY     COLON SURGERY     COLONOSCOPY  01/2011   COLONOSCOPY  11/18/2011   Procedure: COLONOSCOPY;  Surgeon:  [name], MD;  Location: [facility];  Service: Endoscopy;  Laterality: N/A;   COLONOSCOPY  11/24/2011   Procedure: COLONOSCOPY;  Surgeon: Erick Blinks, MD;  Location: [facility];  Service: Gastroenterology;  Laterality: N/A;   ESOPHAGOGASTRODUODENOSCOPY  11/18/2011   Procedure: ESOPHAGOGASTRODUODENOSCOPY (EGD);  Surgeon: [name], MD;  Location: [facility];  Service: Endoscopy;  Laterality: N/A;   LOW ANTERIOR BOWEL RESECTION  1996   diverticulitis   UPPER GASTROINTESTINAL ENDOSCOPY       OB History   No obstetric history on file.     Family History  Problem Relation Age of Onset   Pancreatic cancer Father    Colon cancer Neg Hx    Anesthesia problems Neg Hx    Hypotension Neg Hx    Malignant hyperthermia Neg Hx    Pseudochol deficiency Neg Hx     Social History   Tobacco Use   Smoking status: Never   Smokeless tobacco: Never  Vaping Use   Vaping Use: Never used  Substance Use Topics   Alcohol use: Yes    Alcohol/week: 0.0 standard drinks    Comment: occasional   Drug use: No    Home Medications Prior to Admission medications   Medication Sig Start Date End Date Taking? Authorizing Provider  alendronate (FOSAMAX) 70 MG tablet Take 70 mg by mouth  once a week.  09/17/14   [provider]  amLODipine (NORVASC) 10 MG tablet  04/04/15   [provider]  bisoprolol (ZEBETA) 5 MG tablet Take 5 mg by mouth daily.     [provider]  citalopram (CELEXA) 20 MG tablet Take 10 mg by mouth every other day.     [provider]  donepezil (ARICEPT) 10 MG tablet Take 1 tablet (10 mg total) by mouth at bedtime. 05/30/17   Tat, Octaviano Batty, DO  donepezil (ARICEPT) 10 MG tablet TAKE 1 TABLET(10 MG) BY MOUTH AT BEDTIME 11/11/17   Tat, Octaviano Batty, DO  furosemide (LASIX) 20 MG tablet Take 20 mg by mouth daily.  11/27/13   [provider]  Glucosamine-Chondroitin (GLUCOSAMINE CHONDR COMPLEX PO) Take by mouth daily.    [provider]   lisinopril (PRINIVIL,ZESTRIL) 20 MG tablet  12/11/13   [provider]  vitamin C (ASCORBIC ACID) 500 MG tablet Take 500 mg by mouth daily.    [provider]    Allergies    Alprazolam  Review of Systems   Review of Systems  Constitutional:        Per HPI, otherwise negative  HENT:         Per HPI, otherwise negative  Respiratory:         Per HPI, otherwise negative  Cardiovascular:        Per HPI, otherwise negative  Gastrointestinal:  Negative for vomiting.  Endocrine:       Negative aside from HPI  Genitourinary:        Neg aside from HPI   Musculoskeletal:        Per HPI, otherwise negative  Skin: Negative.   Neurological:  Negative for syncope.   Physical Exam Updated Vital Signs BP (!) 167/86 (BP Location: Right Arm)   Pulse 93   Temp (!) 97.5 F (36.4 C) (Oral)   Resp (!) 22   Ht 5\' 6"  (1.676 m)   Wt 69.4 kg   SpO2 94%   BMI 24.69 kg/m   Physical Exam Vitals and nursing note reviewed.  Constitutional:      Appearance: She is well-developed.     Comments: Frail-appearing elderly female awake and alert sitting upright  HENT:     Head: Normocephalic and atraumatic.  Eyes:     Conjunctiva/sclera: Conjunctivae normal.  Cardiovascular:     Rate and Rhythm: Normal rate and regular rhythm.  Pulmonary:     Effort: Pulmonary effort is normal. No respiratory distress.     Breath sounds: Normal breath sounds. No stridor.  Abdominal:     General: There is no distension.  Musculoskeletal:       Arms:     Cervical back: Full passive range of motion without pain. No spinous process tenderness or muscular tenderness.     Comments: Left elbow, wrist unremarkable.  Pelvis is stable.  With hip flexion on the right patient feels some pain in the left posterior superior hip.  No gross deformity throughout the lower extremities.  Skin:    General: Skin is warm and dry.  Neurological:     Mental Status: She is alert and oriented to person, place, and  time.     Comments: Poor hearing otherwise cranial nerves unremarkable. Patient distally neurovascular intact upper and lower extremities.    ED Results / Procedures / Treatments   Labs (all labs ordered are listed, but only abnormal results are displayed) Labs Reviewed  URINALYSIS,  ROUTINE W REFLEX MICROSCOPIC - Abnormal; Notable for the following components:      Result Value   APPearance HAZY (*)    Hgb urine dipstick TRACE (*)    Leukocytes,Ua LARGE (*)    WBC, UA >50 (*)    All other components within normal limits    EKG None  Radiology DG Pelvis 1-2 Views  Result Date: 09/22/2021 CLINICAL DATA:  Fall EXAM: PELVIS - 1-2 VIEW COMPARISON:  None. FINDINGS: There is no evidence of pelvic fracture or diastasis. No pelvic bone lesions are seen. IMPRESSION: Negative. Electronically Signed   By: Jasmine Pang M.D.   On: 09/22/2021 20:10   DG Shoulder Left  Result Date: 09/22/2021 CLINICAL DATA:  Larey Seat at home EXAM: LEFT SHOULDER - 2+ VIEW COMPARISON:  None. FINDINGS: AC joint borderline widened. Anterior dislocation of the humeral head with respect to glenoid fossa. No definitive fracture IMPRESSION: Anterior shoulder dislocation Electronically Signed   By: Jasmine Pang M.D.   On: 09/22/2021 20:10   DG Shoulder Left Portable  Result Date: 09/22/2021 CLINICAL DATA:  Post reduction. EXAM: LEFT SHOULDER COMPARISON:  Left shoulder x-ray 09/22/2021. FINDINGS: Left shoulder is now in anatomic alignment. No acute fractures are seen. Borderline widening of the acromioclavicular joint is similar to the prior study. Soft tissues are within normal limits. IMPRESSION: 1. Alignment is now anatomic.  No acute fracture. Electronically Signed   By: Darliss Cheney M.D.   On: 09/22/2021 21:53    Procedures .Ortho Injury Treatment  Date/Time: 09/22/2021 9:00 PM Performed by: Gerhard Munch, MD Authorized by: Gerhard Munch, MD   Consent:    Consent obtained:  Verbal   Consent given by:   Patient   Risks discussed:  Fracture, restricted joint movement, vascular damage, stiffness, recurrent dislocation, irreducible dislocation and nerve damage   Alternatives discussed:  No treatment and alternative treatment Universal protocol:    Procedure explained and questions answered to patient or proxy's satisfaction: yes     Relevant documents present and verified: yes     Test results available and properly labeled: yes     Imaging studies available: yes     Required blood products, implants, devices, and special equipment available: yes     Site/side marked: yes     Immediately prior to procedure a time out was called: yes     Patient identity confirmed:  Verbally with patientInjury location: shoulder Location details: left shoulder Injury type: dislocation Dislocation type: anterior Hill-Sachs deformity: no Chronicity: new Pre-procedure neurovascular assessment: neurovascularly intact Pre-procedure distal perfusion: normal Pre-procedure neurological function: normal Pre-procedure range of motion: reduced  Anesthesia: Local anesthesia used: no  Patient sedated: NoManipulation performed: yes Reduction method: external rotation and Milch technique Reduction successful: yes X-ray confirmed reduction: yes Immobilization: sling Splint Applied by: ED Nurse and ED Provider Post-procedure neurovascular assessment: post-procedure neurovascularly intact Post-procedure distal perfusion: normal Post-procedure neurological function: normal Post-procedure range of motion: improved     Medications Ordered in ED Medications  fentaNYL (SUBLIMAZE) injection 12.5 mcg (12.5 mcg Intravenous Given 09/22/21 1932)  fentaNYL (SUBLIMAZE) injection 50 mcg (50 mcg Intravenous Given 09/22/21 2107)  fosfomycin (MONUROL) packet 3 g (3 g Oral Given 09/22/21 2211)    ED Course  I have reviewed the triage vital signs and the nursing notes.  Pertinent labs & imaging results that were available  during my care of the patient were reviewed by me and considered in my medical decision making (see chart for details). Update: Patient accompanied by her  daughter.  We discussed findings, initial x-ray consistent with dislocation.  She and patient both consented for reduction.  Performed without complication.  Patient in no distress, awake, alert.  We reviewed all x-ray images in the room, demonstrating pertinent findings.  Patient self remains hemodynamically unremarkable, amenable to, appropriate for outpatient follow-up with orthopedics.  Patient's urinalysis reviewed as well.  With some family concern for urinary tract infection she received fosfomycin here.  No evidence for bacteremia, sepsis no fever, hypotension.   MDM Rules/Calculators/A&P  MDM Number of Diagnoses or Management Options Dislocation of left shoulder joint, initial encounter: new, needed workup Fall, initial encounter: new, needed workup Lower urinary tract infectious disease: new, needed workup   Amount and/or Complexity of Data Reviewed Clinical lab tests: ordered and reviewed Tests in the radiology section of CPT: ordered and reviewed Tests in the medicine section of CPT: reviewed and ordered Decide to obtain previous medical records or to obtain history from someone other than the patient: yes Obtain history from someone other than the patient: yes Review and summarize past medical records: yes Independent visualization of images, tracings, or specimens: yes  Risk of Complications, Morbidity, and/or Mortality Presenting problems: high Diagnostic procedures: high Management options: high  Critical Care Total time providing critical care: < 30 minutes  Patient Progress Patient progress: improved   Final Clinical Impression(s) / ED Diagnoses Final diagnoses:  Fall, initial encounter  Dislocation of left shoulder joint, initial encounter  Lower urinary tract infectious disease    Rx / DC Orders ED  Discharge Orders          Ordered    Face-to-face encounter (required for Medicare/Medicaid patients)       Comments: I Gerhard Munch certify that this patient is under my care and that I, or a nurse practitioner or physician's assistant working with me, had a face-to-face encounter that meets the physician face-to-face encounter requirements with this patient on 09/22/2021. The encounter with the patient was in whole, or in part for the following medical condition(s) which is the primary reason for home health care (List medical condition): fall with shoulder dislocation   09/22/21 2220             Gerhard Munch, MD 09/22/21 2223

## 2021-09-22 NOTE — Discharge Instructions (Signed)
As discussed, please use ice, and ibuprofen for pain control, and be sure to follow-up with our orthopedic surgery colleague.  Return here for concerning changes in your condition.

## 2021-09-22 NOTE — ED Triage Notes (Signed)
Pt from home after she had a mechanical fall. Pt was putting her dishes away and tripped. Pt family seen it happen on camera. EMS stated obvious deformity to her left shoulder.

## 2021-10-22 ENCOUNTER — Emergency Department (HOSPITAL_BASED_OUTPATIENT_CLINIC_OR_DEPARTMENT_OTHER)
Admission: EM | Admit: 2021-10-22 | Discharge: 2021-10-22 | Disposition: A | Discharge status: Home / Self Care | Payer: Medicare Other | Attending: Emergency Medicine | Admitting: Emergency Medicine

## 2021-10-22 ENCOUNTER — Encounter (HOSPITAL_BASED_OUTPATIENT_CLINIC_OR_DEPARTMENT_OTHER): Payer: Self-pay | Admitting: Emergency Medicine

## 2021-10-22 ENCOUNTER — Other Ambulatory Visit: Payer: Self-pay

## 2021-10-22 ENCOUNTER — Emergency Department (HOSPITAL_BASED_OUTPATIENT_CLINIC_OR_DEPARTMENT_OTHER): Payer: Medicare Other

## 2021-10-22 DIAGNOSIS — R339 Retention of urine, unspecified: Secondary | ICD-10-CM | POA: Insufficient documentation

## 2021-10-22 DIAGNOSIS — U071 COVID-19: Secondary | ICD-10-CM | POA: Diagnosis present

## 2021-10-22 DIAGNOSIS — R630 Anorexia: Secondary | ICD-10-CM | POA: Diagnosis not present

## 2021-10-22 DIAGNOSIS — R531 Weakness: Secondary | ICD-10-CM | POA: Diagnosis not present

## 2021-10-22 DIAGNOSIS — Z79899 Other long term (current) drug therapy: Secondary | ICD-10-CM | POA: Diagnosis not present

## 2021-10-22 DIAGNOSIS — I10 Essential (primary) hypertension: Secondary | ICD-10-CM | POA: Insufficient documentation

## 2021-10-22 DIAGNOSIS — E860 Dehydration: Secondary | ICD-10-CM | POA: Insufficient documentation

## 2021-10-22 LAB — CBC WITH DIFFERENTIAL/PLATELET
Abs Immature Granulocytes: 0.01 10*3/uL (ref 0.00–0.07)
Basophils Absolute: 0 10*3/uL (ref 0.0–0.1)
Basophils Relative: 0 %
Eosinophils Absolute: 0 10*3/uL (ref 0.0–0.5)
Eosinophils Relative: 0 %
HCT: 38.3 % (ref 36.0–46.0)
Hemoglobin: 13 g/dL (ref 12.0–15.0)
Immature Granulocytes: 0 %
Lymphocytes Relative: 20 %
Lymphs Abs: 0.9 10*3/uL (ref 0.7–4.0)
MCH: 32.7 pg (ref 26.0–34.0)
MCHC: 33.9 g/dL (ref 30.0–36.0)
MCV: 96.2 fL (ref 80.0–100.0)
Monocytes Absolute: 0.3 10*3/uL (ref 0.1–1.0)
Monocytes Relative: 7 %
Neutro Abs: 3.2 10*3/uL (ref 1.7–7.7)
Neutrophils Relative %: 73 %
Platelets: 253 10*3/uL (ref 150–400)
RBC: 3.98 MIL/uL (ref 3.87–5.11)
RDW: 12.4 % (ref 11.5–15.5)
WBC: 4.5 10*3/uL (ref 4.0–10.5)
nRBC: 0 % (ref 0.0–0.2)

## 2021-10-22 LAB — URINALYSIS, ROUTINE W REFLEX MICROSCOPIC
Bilirubin Urine: NEGATIVE
Glucose, UA: NEGATIVE mg/dL
Leukocytes,Ua: NEGATIVE
Nitrite: NEGATIVE
Specific Gravity, Urine: 1.012 (ref 1.005–1.030)
pH: 7 (ref 5.0–8.0)

## 2021-10-22 LAB — COMPREHENSIVE METABOLIC PANEL
ALT: 12 U/L (ref 0–44)
AST: 16 U/L (ref 15–41)
Albumin: 3.7 g/dL (ref 3.5–5.0)
Alkaline Phosphatase: 116 U/L (ref 38–126)
Anion gap: 11 (ref 5–15)
BUN: 10 mg/dL (ref 8–23)
CO2: 28 mmol/L (ref 22–32)
Calcium: 8.8 mg/dL — ABNORMAL LOW (ref 8.9–10.3)
Chloride: 94 mmol/L — ABNORMAL LOW (ref 98–111)
Creatinine, Ser: 0.6 mg/dL (ref 0.44–1.00)
GFR, Estimated: >60 mL/min (ref >60)
Glucose, Bld: 120 mg/dL — ABNORMAL HIGH (ref 70–99)
Potassium: 3.4 mmol/L — ABNORMAL LOW (ref 3.5–5.1)
Sodium: 133 mmol/L — ABNORMAL LOW (ref 135–145)
Total Bilirubin: 0.5 mg/dL (ref 0.3–1.2)
Total Protein: 6.9 g/dL (ref 6.5–8.1)

## 2021-10-22 LAB — RESP PANEL BY RT-PCR (FLU A&B, COVID) ARPGX2
Influenza A by PCR: NEGATIVE
Influenza B by PCR: NEGATIVE
SARS Coronavirus 2 by RT PCR: POSITIVE — AB

## 2021-10-22 MED ORDER — SODIUM CHLORIDE 0.9 % IV BOLUS
1000.0000 mL | Freq: Once | INTRAVENOUS | Status: AC
  Administered 2021-10-22: 1000 mL via INTRAVENOUS

## 2021-10-22 MED ORDER — MORPHINE SULFATE (PF) 4 MG/ML IV SOLN
4.0000 mg | Freq: Once | INTRAVENOUS | Status: AC
  Administered 2021-10-22: 4 mg via INTRAVENOUS
  Filled 2021-10-22: qty 1

## 2021-10-22 MED ORDER — ONDANSETRON HCL 4 MG PO TABS: 4.0000 mg | ORAL_TABLET | Freq: Four times a day (QID) | ORAL | 0 refills | Status: DC

## 2021-10-22 MED ORDER — ONDANSETRON HCL 4 MG/2ML IJ SOLN
4.0000 mg | Freq: Once | INTRAMUSCULAR | Status: AC
  Administered 2021-10-22: 4 mg via INTRAVENOUS
  Filled 2021-10-22: qty 2

## 2021-10-22 NOTE — ED Triage Notes (Signed)
Pt arrives to ED with c/o COVID related symptoms and weakness. Pt reports that she tested positive for COVID on 1/1 and has become weak. She reports that she has been unable to tolerate food/fluids. Associated symptoms  include nausea. No SOB.

## 2021-10-22 NOTE — ED Provider Notes (Signed)
[facility]   CSN: [id number] Arrival date & time: 10/22/21  1023     History  Chief Complaint  Patient presents with   Covid Positive   Weakness    Susan Henry is a 88 y.o. female.  The history is provided by the patient and a relative. No language interpreter was used.  Weakness  This is a 88 year old female brought here via EMS from home with COVID-related symptoms.  Home Medications Prior to Admission medications   Medication Sig Start Date End Date Taking? Authorizing Provider  alendronate (FOSAMAX) 70 MG tablet Take 70 mg by mouth once a week.  09/17/14   [provider]  amLODipine (NORVASC) 10 MG tablet  04/04/15   [provider]  bisoprolol (ZEBETA) 5 MG tablet Take 5 mg by mouth daily.     [provider]  citalopram (CELEXA) 20 MG tablet Take 10 mg by mouth every other day.     [provider]  donepezil (ARICEPT) 10 MG tablet Take 1 tablet (10 mg total) by mouth at bedtime. 05/30/17   Tat, Octaviano Battyebecca S, DO  donepezil (ARICEPT) 10 MG tablet TAKE 1 TABLET(10 MG) BY MOUTH AT BEDTIME 11/11/17   Tat, Octaviano Battyebecca S, DO  furosemide (LASIX) 20 MG tablet Take 20 mg by mouth daily.  11/27/13   [provider]  Glucosamine-Chondroitin (GLUCOSAMINE CHONDR COMPLEX PO) Take by mouth daily.    [provider]  lisinopril (PRINIVIL,ZESTRIL) 20 MG tablet  12/11/13   [provider]  vitamin C (ASCORBIC ACID) 500 MG tablet Take 500 mg by mouth daily.    [provider]      Allergies    Alprazolam    Review of Systems   Review of Systems  Neurological:  Positive for weakness.  All other systems reviewed and are negative.  Physical Exam Updated Vital Signs BP 138/89    Pulse 63    Temp 98 F (36.7 C) (Oral)    Resp 13    Ht 5\' 6"  (1.676 m)    Wt 69.4 kg    SpO2 96%    BMI 24.69 kg/m  Physical Exam Vitals and nursing note reviewed.  Constitutional:      General:  She is not in acute distress.    Appearance: She is well-developed.     Comments: Elderly female laying in bed appears fatigued  HENT:     Head: Atraumatic.  Eyes:     Conjunctiva/sclera: Conjunctivae normal.  Cardiovascular:     Rate and Rhythm: Normal rate and regular rhythm.     Pulses: Normal pulses.     Heart sounds: Normal heart sounds.  Pulmonary:     Effort: Pulmonary effort is normal.     Breath sounds: No wheezing, rhonchi or rales.  Abdominal:     Palpations: Abdomen is soft.     Tenderness: There is no abdominal tenderness.  Musculoskeletal:        General: Tenderness (Tenderness on mid thoracic spine without crepitus or step-off noted) present.     Cervical back: Neck supple.  Skin:    Findings: No rash.  Neurological:     Mental Status: She is alert. Mental status is at baseline.  Psychiatric:        Mood and Affect: Mood normal.    ED Results / Procedures / Treatments   Labs (all labs ordered are listed, but only abnormal results are displayed) Labs Reviewed  RESP PANEL BY RT-PCR (  FLU A&B, COVID) ARPGX2 - Abnormal; Notable for the following components:      Result Value   SARS Coronavirus 2 by RT PCR POSITIVE (*)    All other components within normal limits  URINALYSIS, ROUTINE W REFLEX MICROSCOPIC - Abnormal; Notable for the following components:   Hgb urine dipstick SMALL (*)    Ketones, ur TRACE (*)    Protein, ur TRACE (*)    All other components within normal limits  COMPREHENSIVE METABOLIC PANEL - Abnormal; Notable for the following components:   Sodium 133 (*)    Potassium 3.4 (*)    Chloride 94 (*)    Glucose, Bld 120 (*)    Calcium 8.8 (*)    All other components within normal limits  CBC WITH DIFFERENTIAL/PLATELET  CBG MONITORING, ED    EKG None ED ECG REPORT   Date: 10/22/2021  Rate: 80  Rhythm: normal sinus rhythm  QRS Axis: left  Intervals: QT prolonged  ST/T Wave abnormalities: nonspecific ST changes  Conduction  Disutrbances:left bundle branch block  Narrative Interpretation:   Old EKG Reviewed: unchanged  I have personally reviewed the EKG tracing and agree with the computerized printout as noted.   Radiology DG Chest Portable 1 View  Result Date: 10/22/2021 CLINICAL DATA:  COVID positive EXAM: PORTABLE CHEST 1 VIEW COMPARISON:  None. FINDINGS: Patient is rotated to the left limiting evaluation. Heart size appears within normal limits. Calcified plaques in the aortic arch. No definite focal consolidation identified. No pleural effusion or pneumothorax visualized. IMPRESSION: No acute intrathoracic process identified. Electronically Signed   By: Jannifer Hick M.D.   On: 10/22/2021 13:56    Procedures Procedures    Medications Ordered in ED Medications  sodium chloride 0.9 % bolus 1,000 mL (0 mLs Intravenous Stopped 10/22/21 1609)  morphine 4 MG/ML injection 4 mg (4 mg Intravenous Given 10/22/21 1250)  ondansetron (ZOFRAN) injection 4 mg (4 mg Intravenous Given 10/22/21 1248)  sodium chloride 0.9 % bolus 1,000 mL (1,000 mLs Intravenous New Bag/Given 10/22/21 1608)    ED Course/ Medical Decision Making/ A&P                           Medical Decision Making Problems Addressed: COVID-19 virus infection: acute illness or injury    Details: Discontinue Paxlovid, continue supportive care at home Dehydration: acute illness or injury    Details: stay hydrated, take zofran as needed for nausea Generalized weakness: self-limited or minor problem    Details: weakness from covid infection. continue with current management Urinary retention: acute illness or injury    Details: this could be situational, but return if persists.  Amount and/or Complexity of Data Reviewed Independent Historian: caregiver External Data Reviewed: notes.  Risk OTC drugs. Prescription drug management. Decision regarding hospitalization.   BP 138/89    Pulse 63    Temp 98 F (36.7 C) (Oral)    Resp 13    Ht 5\' 6"   (1.676 m)    Wt 69.4 kg    SpO2 96%    BMI 24.69 kg/m   56:81 PM 88 year old female with significant history of hypertension, anemia, recurrent pneumonia brought here via EMS from home for evaluation of COVID-related symptoms.  History was obtained through daughter who is at bedside.  I have also reviewed patient's medical record and included in my decision making.  Patient recently tested positive for COVID infection on 1/1.  She developed cold symptoms on 10/17/2021.  She was prescribed Tamiflu and have been taking it for the past 4 days.  Per daughter, she appears to be deconditioned and dehydrated.  She is continually retching, having some loose stools, staying mostly in bed and appears very fatigued.  She previously had a fall and injured her right shoulder and her back.  States that she had a T11 compression fracture as well as a shoulder injury having to wear the sling.  The recent sickness has caused the back pain to be more intense. She does have occasional cough and has had fever improved with Tylenol.  At this time the primary concern is dehydration from diarrhea, decreased appetite and retching.  I suspect this could be a side effect from Paxlovid.  I recommend discontinue the medication.  Patient is not hypoxic and vital signs stable.  She does appear dry on exam, she would benefit from IV fluid hydration.  Patient does have a caregiver at home that provide continued care for her.  My plan is to check basic labs, assess any electrolyte abnormalities, provide symptomatic care and consider admission.  I will take in account patient's social determinant of health into my care plan.  3:47 PM Labs, EKG, and imaging was independently reviewed and interpreted by me.  Viral respiratory panel confirmed patient is COVID-positive.  Her electrolyte panels are reassuring, normal renal function,Normal WBC and normal H&H.  Chest x-ray without any acute abnormalities.  EKG shows left bundle branch block  unchanged from prior.  Patient was giving IV fluid in which she felt better.  Patient also was given food and drinks and has been able to keep it down.  She has not vomited here.  Daughter felt patient has perked up a bit.  Patient has not make any urine yet.  After discussion we felt additional fluid resuscitation would be appropriate.  I have ordered additional normal saline fluid and recheck her urinalysis as well.  5:35 PM Initially pt was placed on a purewick to obtain UA without success.  After 2L of IVF she still doesn't produce any UA.  We did a bladder scan and it was >700cc.  Will place a foley.  Patient previously did have a fall and suffered a T11 compression fracture.  She has been complaining of back pain.  Patient however able to move her legs and has sensation to her lower extremities therefore I have low suspicion for cauda equina.  She will need to follow-up with urology for further assessments of her acute urinary retention.  I did discussed care with Dr. Deretha EmoryZackowski.  I did considered T/Lspine MRI but did not order.   7:07 PM Good urine production through the urinary Foley catheter.  Urine obtained show no evidence of UTI.  At this time patient appears more comfortable and family member felt patient can return home.  I did recommend outpatient follow-up with urology to assess for her urinary retention however her daughter felt that patient would likely pulled on her Foley at nighttime and dislodge it.  Daughter mention patient usually remove all of her clothing at nighttime.  Daughter reported patient was urinating fine yesterday.  She is able to move her legs appropriately.  I have agreed to remove Foley but encourage strict return for further assessment if patient unable to urinate at home.  Daughter agreed.  Will discharge.  Please note we did discussed option of admission as well but the preference is to go home at this time since since patient is having an appetite  and showing  improvement of her symptoms.        Final Clinical Impression(s) / ED Diagnoses Final diagnoses:  COVID-19 virus infection  Generalized weakness  Urinary retention  Dehydration    Rx / DC Orders ED Discharge Orders          Ordered    ondansetron (ZOFRAN) 4 MG tablet  Every 6 hours        10/22/21 1913              Fayrene Helper, PA-C 10/22/21 1917    Vanetta Mulders, MD 11/25/21 [phone]

## 2021-10-22 NOTE — Discharge Instructions (Addendum)
You have been treated for dehydration likely due to ongoing COVID infection and possible side effect of Paxlovid.  You may discontinue this medication.  You may take Zofran as needed for nausea.  If you develop difficulty urinating please return to the ER for further assessment.  Recommendations for at home COVID-19 symptoms management:  Please continue isolation at home. If have acute worsening of symptoms please go to ER/urgent care for further evaluation. Check pulse oximetry and if below 90-92% please go to ER. The following supplements MAY help:  Vitamin C 500mg  twice a day and Quercetin 250-500 mg twice a day Vitamin D3 2000 - 4000 u/day B Complex vitamins Zinc 75-100 mg/day Melatonin 6-10 mg at night (the optimal dose is unknown) Aspirin 81mg /day (if no history of bleeding issues)

## 2021-11-24 ENCOUNTER — Inpatient Hospital Stay (HOSPITAL_COMMUNITY)
Admission: EM | Admit: 2021-11-24 | Discharge: 2021-11-28 | DRG: 378 | Disposition: A | Discharge status: Home Health | Payer: Medicare Other | Attending: Internal Medicine | Admitting: Internal Medicine

## 2021-11-24 ENCOUNTER — Other Ambulatory Visit: Payer: Self-pay

## 2021-11-24 ENCOUNTER — Emergency Department (HOSPITAL_COMMUNITY): Payer: Medicare Other

## 2021-11-24 ENCOUNTER — Encounter (HOSPITAL_COMMUNITY): Payer: Self-pay | Admitting: Internal Medicine

## 2021-11-24 DIAGNOSIS — F03A0 Unspecified dementia, mild, without behavioral disturbance, psychotic disturbance, mood disturbance, and anxiety: Secondary | ICD-10-CM | POA: Diagnosis present

## 2021-11-24 DIAGNOSIS — Z7983 Long term (current) use of bisphosphonates: Secondary | ICD-10-CM | POA: Diagnosis not present

## 2021-11-24 DIAGNOSIS — M1990 Unspecified osteoarthritis, unspecified site: Secondary | ICD-10-CM | POA: Diagnosis present

## 2021-11-24 DIAGNOSIS — J302 Other seasonal allergic rhinitis: Secondary | ICD-10-CM | POA: Diagnosis present

## 2021-11-24 DIAGNOSIS — R339 Retention of urine, unspecified: Secondary | ICD-10-CM | POA: Diagnosis not present

## 2021-11-24 DIAGNOSIS — J449 Chronic obstructive pulmonary disease, unspecified: Secondary | ICD-10-CM | POA: Diagnosis present

## 2021-11-24 DIAGNOSIS — Z8719 Personal history of other diseases of the digestive system: Secondary | ICD-10-CM | POA: Diagnosis not present

## 2021-11-24 DIAGNOSIS — D649 Anemia, unspecified: Secondary | ICD-10-CM

## 2021-11-24 DIAGNOSIS — R338 Other retention of urine: Secondary | ICD-10-CM | POA: Diagnosis not present

## 2021-11-24 DIAGNOSIS — K921 Melena: Secondary | ICD-10-CM | POA: Diagnosis not present

## 2021-11-24 DIAGNOSIS — K922 Gastrointestinal hemorrhage, unspecified: Secondary | ICD-10-CM | POA: Diagnosis present

## 2021-11-24 DIAGNOSIS — K5790 Diverticulosis of intestine, part unspecified, without perforation or abscess without bleeding: Secondary | ICD-10-CM | POA: Diagnosis not present

## 2021-11-24 DIAGNOSIS — D509 Iron deficiency anemia, unspecified: Secondary | ICD-10-CM | POA: Diagnosis present

## 2021-11-24 DIAGNOSIS — F0390 Unspecified dementia without behavioral disturbance: Secondary | ICD-10-CM | POA: Diagnosis not present

## 2021-11-24 DIAGNOSIS — D62 Acute posthemorrhagic anemia: Secondary | ICD-10-CM | POA: Diagnosis present

## 2021-11-24 DIAGNOSIS — Z8616 Personal history of COVID-19: Secondary | ICD-10-CM

## 2021-11-24 DIAGNOSIS — Z79899 Other long term (current) drug therapy: Secondary | ICD-10-CM | POA: Diagnosis not present

## 2021-11-24 DIAGNOSIS — K648 Other hemorrhoids: Secondary | ICD-10-CM | POA: Diagnosis present

## 2021-11-24 DIAGNOSIS — F419 Anxiety disorder, unspecified: Secondary | ICD-10-CM | POA: Diagnosis present

## 2021-11-24 DIAGNOSIS — K5731 Diverticulosis of large intestine without perforation or abscess with bleeding: Secondary | ICD-10-CM

## 2021-11-24 DIAGNOSIS — E876 Hypokalemia: Secondary | ICD-10-CM | POA: Diagnosis not present

## 2021-11-24 DIAGNOSIS — K219 Gastro-esophageal reflux disease without esophagitis: Secondary | ICD-10-CM

## 2021-11-24 DIAGNOSIS — Z888 Allergy status to other drugs, medicaments and biological substances status: Secondary | ICD-10-CM | POA: Diagnosis not present

## 2021-11-24 DIAGNOSIS — K5733 Diverticulitis of large intestine without perforation or abscess with bleeding: Principal | ICD-10-CM | POA: Diagnosis present

## 2021-11-24 DIAGNOSIS — I10 Essential (primary) hypertension: Secondary | ICD-10-CM | POA: Diagnosis not present

## 2021-11-24 DIAGNOSIS — F32A Depression, unspecified: Secondary | ICD-10-CM | POA: Diagnosis present

## 2021-11-24 DIAGNOSIS — Z9049 Acquired absence of other specified parts of digestive tract: Secondary | ICD-10-CM | POA: Diagnosis not present

## 2021-11-24 DIAGNOSIS — I119 Hypertensive heart disease without heart failure: Secondary | ICD-10-CM | POA: Diagnosis present

## 2021-11-24 DIAGNOSIS — K838 Other specified diseases of biliary tract: Secondary | ICD-10-CM | POA: Diagnosis not present

## 2021-11-24 LAB — TYPE AND SCREEN
ABO/RH(D): A POS
Antibody Screen: NEGATIVE

## 2021-11-24 LAB — CBC
HCT: 32.4 % — ABNORMAL LOW (ref 36.0–46.0)
Hemoglobin: 10.3 g/dL — ABNORMAL LOW (ref 12.0–15.0)
MCH: 32.7 pg (ref 26.0–34.0)
MCHC: 31.8 g/dL (ref 30.0–36.0)
MCV: 102.9 fL — ABNORMAL HIGH (ref 80.0–100.0)
Platelets: 394 10*3/uL (ref 150–400)
RBC: 3.15 MIL/uL — ABNORMAL LOW (ref 3.87–5.11)
RDW: 13.4 % (ref 11.5–15.5)
WBC: 7.6 10*3/uL (ref 4.0–10.5)
nRBC: 0 % (ref 0.0–0.2)

## 2021-11-24 LAB — RESP PANEL BY RT-PCR (FLU A&B, COVID) ARPGX2
Influenza A by PCR: NEGATIVE
Influenza B by PCR: NEGATIVE
SARS Coronavirus 2 by RT PCR: POSITIVE — AB

## 2021-11-24 LAB — COMPREHENSIVE METABOLIC PANEL
ALT: 12 U/L (ref 0–44)
AST: 17 U/L (ref 15–41)
Albumin: 3.2 g/dL — ABNORMAL LOW (ref 3.5–5.0)
Alkaline Phosphatase: 96 U/L (ref 38–126)
Anion gap: 7 (ref 5–15)
BUN: 14 mg/dL (ref 8–23)
CO2: 30 mmol/L (ref 22–32)
Calcium: 8.5 mg/dL — ABNORMAL LOW (ref 8.9–10.3)
Chloride: 97 mmol/L — ABNORMAL LOW (ref 98–111)
Creatinine, Ser: 0.5 mg/dL (ref 0.44–1.00)
GFR, Estimated: >60 mL/min (ref >60)
Glucose, Bld: 116 mg/dL — ABNORMAL HIGH (ref 70–99)
Potassium: 4.2 mmol/L (ref 3.5–5.1)
Sodium: 134 mmol/L — ABNORMAL LOW (ref 135–145)
Total Bilirubin: 0.4 mg/dL (ref 0.3–1.2)
Total Protein: 6.3 g/dL — ABNORMAL LOW (ref 6.5–8.1)

## 2021-11-24 LAB — POC OCCULT BLOOD, ED: Fecal Occult Bld: POSITIVE — AB

## 2021-11-24 LAB — PROTIME-INR
INR: 1.1 (ref 0.8–1.2)
Prothrombin Time: 13.7 seconds (ref 11.4–15.2)

## 2021-11-24 LAB — HEMOGLOBIN AND HEMATOCRIT, BLOOD
HCT: 30 % — ABNORMAL LOW (ref 36.0–46.0)
HCT: 32.1 % — ABNORMAL LOW (ref 36.0–46.0)
Hemoglobin: 9.5 g/dL — ABNORMAL LOW (ref 12.0–15.0)
Hemoglobin: 10.2 g/dL — ABNORMAL LOW (ref 12.0–15.0)

## 2021-11-24 MED ORDER — DONEPEZIL HCL 10 MG PO TABS
10.0000 mg | ORAL_TABLET | Freq: Every day | ORAL | Status: DC
  Administered 2021-11-24 – 2021-11-27 (×4): 10 mg via ORAL
  Filled 2021-11-24 (×5): qty 1

## 2021-11-24 MED ORDER — CITALOPRAM HYDROBROMIDE 10 MG PO TABS: 10.0000 mg | ORAL_TABLET | ORAL | Status: DC

## 2021-11-24 MED ORDER — SODIUM CHLORIDE 0.9 % IV SOLN: 250.0000 mL | INTRAVENOUS | Status: DC | PRN

## 2021-11-24 MED ORDER — MELATONIN 5 MG PO TABS
5.0000 mg | ORAL_TABLET | Freq: Once | ORAL | Status: AC
  Administered 2021-11-24: 5 mg via ORAL
  Filled 2021-11-24: qty 1

## 2021-11-24 MED ORDER — SODIUM CHLORIDE 0.9 % IV BOLUS
500.0000 mL | Freq: Once | INTRAVENOUS | Status: AC
  Administered 2021-11-24: 500 mL via INTRAVENOUS

## 2021-11-24 MED ORDER — BISOPROLOL FUMARATE 5 MG PO TABS: 5.0000 mg | ORAL_TABLET | Freq: Every day | ORAL | Status: DC

## 2021-11-24 MED ORDER — PANTOPRAZOLE SODIUM 40 MG IV SOLR
40.0000 mg | Freq: Two times a day (BID) | INTRAVENOUS | Status: DC
  Administered 2021-11-24 – 2021-11-27 (×6): 40 mg via INTRAVENOUS
  Filled 2021-11-24 (×6): qty 10

## 2021-11-24 MED ORDER — SODIUM CHLORIDE 0.9% FLUSH: 3.0000 mL | INTRAVENOUS | Status: DC | PRN

## 2021-11-24 MED ORDER — AMLODIPINE BESYLATE 5 MG PO TABS
5.0000 mg | ORAL_TABLET | Freq: Every day | ORAL | Status: DC
  Administered 2021-11-24 – 2021-11-28 (×5): 5 mg via ORAL
  Filled 2021-11-24 (×6): qty 1

## 2021-11-24 MED ORDER — ONDANSETRON HCL 4 MG PO TABS: 4.0000 mg | ORAL_TABLET | Freq: Four times a day (QID) | ORAL | Status: DC

## 2021-11-24 MED ORDER — SODIUM CHLORIDE 0.9 % IV SOLN: INTRAVENOUS | Status: DC

## 2021-11-24 MED ORDER — CITALOPRAM HYDROBROMIDE 20 MG PO TABS
20.0000 mg | ORAL_TABLET | Freq: Every day | ORAL | Status: DC
  Administered 2021-11-25 – 2021-11-27 (×3): 20 mg via ORAL
  Filled 2021-11-24 (×3): qty 1

## 2021-11-24 MED ORDER — SODIUM CHLORIDE 0.9% FLUSH
3.0000 mL | Freq: Two times a day (BID) | INTRAVENOUS | Status: DC
  Administered 2021-11-25 – 2021-11-27 (×5): 3 mL via INTRAVENOUS

## 2021-11-24 MED ORDER — ACETAMINOPHEN 325 MG PO TABS
650.0000 mg | ORAL_TABLET | Freq: Four times a day (QID) | ORAL | Status: DC | PRN
  Administered 2021-11-27: 650 mg via ORAL
  Filled 2021-11-24: qty 2

## 2021-11-24 MED ORDER — AMLODIPINE BESYLATE 5 MG PO TABS: 2.5000 mg | ORAL_TABLET | Freq: Every day | ORAL | Status: DC

## 2021-11-24 MED ORDER — ACETAMINOPHEN 650 MG RE SUPP: 650.0000 mg | Freq: Four times a day (QID) | RECTAL | Status: DC | PRN

## 2021-11-24 MED ORDER — PANTOPRAZOLE SODIUM 40 MG IV SOLR
40.0000 mg | Freq: Once | INTRAVENOUS | Status: AC
  Administered 2021-11-24: 40 mg via INTRAVENOUS
  Filled 2021-11-24: qty 10

## 2021-11-24 NOTE — Assessment & Plan Note (Addendum)
FOBT positive.  Stable hemoglobin.  No further bleed.  Did not require any blood transfusion during hospitalization.

## 2021-11-24 NOTE — Assessment & Plan Note (Addendum)
Patient did have a colonoscopy back in 2013 and notable for diverticular disease.  GI on board.  Possibility of diverticular bleed.  CT angiogram GI bleed protocol without any active signs of bleeding.  Hemoglobin has remained stable.  Latest hemoglobin of 8.6 from 8.9<8.8.  Changed to oral Protonix.  Has been changed to soft diet.  GI bleed has resolved.

## 2021-11-24 NOTE — ED Provider Notes (Signed)
[facility]-EMERGENCY DEPT Provider Note   CSN: [id number] Arrival date & time: 11/24/21  0807     History  Chief Complaint  Patient presents with   Rectal Bleeding   Fatigue    Susan Henry is a 88 y.o. female.  Patient with remote hx gi bleed, presents via EMS from SNF with dark blood per rectum. Symptoms acute onset today, moderate, persistent. Pt indicates feels generally weak. No syncope. No abd pain or nausea/vomiting. No anticoag or nsaid use.   The history is provided by the patient, a relative, medical records and the EMS personnel.  Rectal Bleeding Associated symptoms: no abdominal pain, no fever and no vomiting       Home Medications Prior to Admission medications   Medication Sig Start Date End Date Taking? Authorizing Provider  alendronate (FOSAMAX) 70 MG tablet Take 70 mg by mouth once a week.  09/17/14   [provider]  amLODipine (NORVASC) 10 MG tablet  04/04/15   [provider]  bisoprolol (ZEBETA) 5 MG tablet Take 5 mg by mouth daily.     [provider]  citalopram (CELEXA) 20 MG tablet Take 10 mg by mouth every other day.     [provider]  donepezil (ARICEPT) 10 MG tablet Take 1 tablet (10 mg total) by mouth at bedtime. 05/30/17   Tat, Octaviano Batty, DO  donepezil (ARICEPT) 10 MG tablet TAKE 1 TABLET(10 MG) BY MOUTH AT BEDTIME 11/11/17   Tat, Octaviano Batty, DO  furosemide (LASIX) 20 MG tablet Take 20 mg by mouth daily.  11/27/13   [provider]  Glucosamine-Chondroitin (GLUCOSAMINE CHONDR COMPLEX PO) Take by mouth daily.    [provider]  lisinopril (PRINIVIL,ZESTRIL) 20 MG tablet  12/11/13   [provider]  ondansetron (ZOFRAN) 4 MG tablet Take 1 tablet (4 mg total) by mouth every 6 (six) hours. 10/22/21   Fayrene Helper, PA-C  vitamin C (ASCORBIC ACID) 500 MG tablet Take 500 mg by mouth daily.    [provider]      Allergies    Alprazolam    Review of Systems    Review of Systems  Constitutional:  Negative for chills and fever.  HENT:  Negative for sore throat.   Eyes:  Negative for redness.  Respiratory:  Negative for cough and shortness of breath.   Cardiovascular:  Negative for chest pain.  Gastrointestinal:  Positive for blood in stool and hematochezia. Negative for abdominal pain and vomiting.  Genitourinary:  Negative for flank pain.  Musculoskeletal:  Negative for back pain and neck pain.  Skin:  Negative for rash.  Neurological:  Negative for headaches.  Hematological:  Does not bruise/bleed easily.  Psychiatric/Behavioral:  Negative for agitation.    Physical Exam Updated Vital Signs BP 132/69 (BP Location: Right Arm)    Pulse 83    Temp 98.5 F (36.9 C) (Oral)    Resp 16    Ht 1.676 m (5\' 6" )    Wt 61.2 kg    SpO2 96%    BMI 21.79 kg/m  Physical Exam Vitals and nursing note reviewed.  Constitutional:      Appearance: Normal appearance. She is well-developed.  HENT:     Head: Atraumatic.     Nose: Nose normal.     Mouth/Throat:     Mouth: Mucous membranes are moist.  Eyes:     General: No scleral icterus.    Conjunctiva/sclera: Conjunctivae normal.  Neck:  Trachea: No tracheal deviation.  Cardiovascular:     Rate and Rhythm: Normal rate and regular rhythm.     Pulses: Normal pulses.     Heart sounds: Normal heart sounds. No murmur heard.   No friction rub. No gallop.  Pulmonary:     Effort: Pulmonary effort is normal. No respiratory distress.     Breath sounds: Normal breath sounds.  Abdominal:     General: Bowel sounds are normal. There is no distension.     Palpations: Abdomen is soft.     Tenderness: There is no abdominal tenderness. There is no guarding.  Genitourinary:    Comments: No cva tenderness. Dark maroon stool, heme positive. No mass felt.  Musculoskeletal:        General: No swelling.     Cervical back: Normal range of motion and neck supple. No rigidity. No muscular tenderness.  Skin:     General: Skin is warm and dry.     Findings: No rash.  Neurological:     Mental Status: She is alert.     Comments: Alert, speech normal.   Psychiatric:        Mood and Affect: Mood normal.    ED Results / Procedures / Treatments   Labs (all labs ordered are listed, but only abnormal results are displayed) Results for orders placed or performed during the hospital encounter of 11/24/21  CBC  Result Value Ref Range   WBC 7.6 4.0 - 10.5 K/uL   RBC 3.15 (L) 3.87 - 5.11 MIL/uL   Hemoglobin 10.3 (L) 12.0 - 15.0 g/dL   HCT 99.7 (L) 74.1 - 42.3 %   MCV 102.9 (H) 80.0 - 100.0 fL   MCH 32.7 26.0 - 34.0 pg   MCHC 31.8 30.0 - 36.0 g/dL   RDW 95.3 20.2 - 33.4 %   Platelets 394 150 - 400 K/uL   nRBC 0.0 0.0 - 0.2 %  Comprehensive metabolic panel  Result Value Ref Range   Sodium 134 (L) 135 - 145 mmol/L   Potassium 4.2 3.5 - 5.1 mmol/L   Chloride 97 (L) 98 - 111 mmol/L   CO2 30 22 - 32 mmol/L   Glucose, Bld 116 (H) 70 - 99 mg/dL   BUN 14 8 - 23 mg/dL   Creatinine, Ser 3.56 0.44 - 1.00 mg/dL   Calcium 8.5 (L) 8.9 - 10.3 mg/dL   Total Protein 6.3 (L) 6.5 - 8.1 g/dL   Albumin 3.2 (L) 3.5 - 5.0 g/dL   AST 17 15 - 41 U/L   ALT 12 0 - 44 U/L   Alkaline Phosphatase 96 38 - 126 U/L   Total Bilirubin 0.4 0.3 - 1.2 mg/dL   GFR, Estimated >86 >16 mL/min   Anion gap 7 5 - 15  Protime-INR  Result Value Ref Range   Prothrombin Time 13.7 11.4 - 15.2 seconds   INR 1.1 0.8 - 1.2  POC occult blood, ED Provider will collect  Result Value Ref Range   Fecal Occult Bld POSITIVE (A) NEGATIVE  Type and screen  Result Value Ref Range   ABO/RH(D) A POS    Antibody Screen NEG    Sample Expiration      11/27/2021,2359 Performed at [facility], [address], [place], [place] [address]    EKG None  Radiology No results found.  Procedures Procedures    Medications Ordered in ED Medications  pantoprazole (PROTONIX) injection 40 mg (has no administration in time range)  sodium chloride 0.9 % bolus 500 mL (has no administration in time range)    ED Course/ Medical Decision Making/ A&P                           Medical Decision Making Problems Addressed: Acute GI bleeding: acute illness or injury with systemic symptoms that poses a threat to life or bodily functions Symptomatic anemia: acute illness or injury with systemic symptoms  Amount and/or Complexity of Data Reviewed Independent Historian: EMS    Details: family, additional hx. External Data Reviewed: labs, radiology and notes. Labs: ordered. Decision-making details documented in ED Course. Radiology: ordered and independent interpretation performed. Decision-making details documented in ED Course. ECG/medicine tests: ordered and independent interpretation performed. Decision-making details documented in ED Course.  Risk Prescription drug management. Decision regarding hospitalization.   Iv ns. Continuous pulse ox and monitoring. Stat labs.   Cardiac monitor - sinus rhythm, rate 84.   Reviewed nursing notes and prior charts for additional history. External reports reviewed. Additional hx from SNF, family, and EMS.   Labs reviewed/interpreted by me - hgb 10, decreased from prior. Hemoccult positive.   Protonix iv.   GI consulted. Initially discussed with [facility] as no GI notes in Epic in 10 yrs - they did some digging and indicate pt still belongs to [facility] - [facility] consulted - discussed pt - will see in consult.   Hospitalists consulted for admission. Discussed pt, labs, etc. - they will admit.   Recheck pt, abd soft nt. Vitals appear stable.             Final Clinical Impression(s) / ED Diagnoses Final diagnoses:  None    Rx / DC Orders ED Discharge Orders     None         Cathren Laine, MD 11/27/21 2217

## 2021-11-24 NOTE — Assessment & Plan Note (Addendum)
Patient is on amlodipine, and lisinopril at home.

## 2021-11-24 NOTE — Consult Note (Addendum)
Referring Provider: Dr. Ashok Cordia Primary Care Physician:  Glenis Smoker, MD Primary Gastroenterologist:  Dr. Silvano Rusk  Reason for Consultation: Anemia, bloody stool  HPI: Susan Henry is a 88 y.o. female with a past medical history of anxiety, depression, dementia, hypertension, iron deficiency anemia, GERD, adenomatous colon polyps 1999, diverticulosis with recurrent lower GI bleed (likely diverticular bleed) in 2013.  Past cholecystectomy.  She lives at home with 24/7 care by shared family caregivers. She awakened this morning with dark red blood on her bed linen and depends. Her daughter assisted her to the bathroom and she passed 2 moderate to large amounts of bright red and dark red blood with darker clots with a BM. No associated abdominal pain. Her daughter was concerned regarding the large amount of blood per the rectum and transported her to [facility] ED for further evaluation.  Labs in ED showed a hemoglobin level 10.3 (Hg level 13 on 10/22/2021).  Hematocrit 32.4.  MCV 102.9.  Platelet 394.  BUN 14.  Creatinine 0.50 sodium 134.  Normal LFTs.  INR 1.1.  SARS coronavirus 2 test pending.  Type and screen collected.  Her daughter Rise is at the bedside and provides her mother's history.  She stated her mother was doing fairly well until she fell at home early December 2022 which resulted  in a left shoulder dislocation and thoracic spine injury.  Since then, she demonstrated progressive fatigue with ongoing mid to lower back pain and frequent urination.  She was taking Aleve 1 tab, 2 Tylenol tabs and 1 Pepcid once daily for 6 weeks due to having back pain then transition to taking Aleve 1 tab twice daily for the past 3 weeks.  She was treated for UTI with several antibiotics over the past 4 weeks.  Her mother typically passes a normal brown formed stool most days.  Today was the first time of they have seen any rectal bleeding since her hospitalization in 2013.    History of recurrent lower GI bleed in  Jan - Feb 2013. She underwent  2 colonoscopies during that admission. The first colonoscopy was done 11/18/2011 and at that time she had a small amount of blood throughout the colon backwashing into the terminal ileum. There was no culprit diverticulum identified. Repeat colonoscopy was done 11/24/2011. There was no blood in her colon at that time she did have a small diverticulum noted in the terminal ileum and severe diverticulosis from the ascending colon to the sigmoid colon noted.  Her rectal bleeding abated resolved then recurred on 07/30/2012. She was admitted to the hospital 07/2012 with a GI bleed (bright red hematochezia) with presyncope and hypotension and she was transfused 6 units of PRBCs.  She underwent a negative nuclear RBC scan.  She was evaluated by general surgery, no immediate surgery was recommended but a future subtotal colectomy was considered.  Her hemoglobin level stabilized around 8.5 and she was discharged home.  She had recurrent hematochezia and was readmitted to the hospital 08/08/2012. A tagged RBC scan showed active bleeding in the right colon.  A mesenteric angiogram showed active extravasation from the asc colon branch off SMA s/p selective embolization by IR with cessation of bleeding.  She required 2 units of PRBCs during this admission.  Her hemoglobin stabilized without any further hematochezia and she was discharged home.  Colonoscopy 11/24/2011 by Dr. Hilarie Fredrickson: Diverticulum in the terminal ileum Otherwise normal examination in the terminal ileum Severe diverticulosis ascending colon to sigmoid colon.  No active bleeding.  Small internal hemorrhoids. Transfused 1 unit PRBCs.  Colonoscopy 11/18/2011 by Dr. Ardis Hughs during hospital admission:  Past Medical History:  Diagnosis Date   Allergy    Seasonal   Anemia    Iron deficiency    Anxiety    Aortic sclerosis    Arthritis    Blood transfusion    Colon polyps    Hx of   adenomatous   Depression    Diverticulosis    GERD (gastroesophageal reflux disease)    Hemorrhoids, internal, with bleeding 01/08/2011   Hypertension    Insomnia    Pneumonia    Reflux     Past Surgical History:  Procedure Laterality Date   CHOLECYSTECTOMY     COLON SURGERY     COLONOSCOPY  01/2011   COLONOSCOPY  11/18/2011   Procedure: COLONOSCOPY;  Surgeon: Owens Loffler, MD;  Location: [facility] ENDOSCOPY;  Service: Endoscopy;  Laterality: N/A;   COLONOSCOPY  11/24/2011   Procedure: COLONOSCOPY;  Surgeon: Zenovia Jarred, MD;  Location: [facility] ENDOSCOPY;  Service: Gastroenterology;  Laterality: N/A;   ESOPHAGOGASTRODUODENOSCOPY  11/18/2011   Procedure: ESOPHAGOGASTRODUODENOSCOPY (EGD);  Surgeon: Owens Loffler, MD;  Location: [facility];  Service: Endoscopy;  Laterality: N/A;   LOW ANTERIOR BOWEL RESECTION  1996   diverticulitis   UPPER GASTROINTESTINAL ENDOSCOPY      Prior to Admission medications   Medication Sig Start Date End Date Taking? Authorizing Provider  alendronate (FOSAMAX) 70 MG tablet Take 70 mg by mouth once a week.  09/17/14   [provider]  amLODipine (NORVASC) 10 MG tablet  04/04/15   [provider]  bisoprolol (ZEBETA) 5 MG tablet Take 5 mg by mouth daily.     [provider]  citalopram (CELEXA) 20 MG tablet Take 10 mg by mouth every other day.     [provider]  donepezil (ARICEPT) 10 MG tablet Take 1 tablet (10 mg total) by mouth at bedtime. 05/30/17   Tat, Eustace Quail, DO  donepezil (ARICEPT) 10 MG tablet TAKE 1 TABLET(10 MG) BY MOUTH AT BEDTIME 11/11/17   Tat, Eustace Quail, DO  furosemide (LASIX) 20 MG tablet Take 20 mg by mouth daily.  11/27/13   [provider]  Glucosamine-Chondroitin (GLUCOSAMINE CHONDR COMPLEX PO) Take by mouth daily.    [provider]  lisinopril (PRINIVIL,ZESTRIL) 20 MG tablet  12/11/13   [provider]  ondansetron (ZOFRAN) 4 MG tablet Take 1 tablet (4 mg total) by mouth every 6 (six)  hours. 10/22/21   Domenic Moras, PA-C  vitamin C (ASCORBIC ACID) 500 MG tablet Take 500 mg by mouth daily.    [provider]    No current facility-administered medications for this encounter.   Current Outpatient Medications  Medication Sig Dispense Refill   alendronate (FOSAMAX) 70 MG tablet Take 70 mg by mouth once a week.   1   amLODipine (NORVASC) 10 MG tablet   0   bisoprolol (ZEBETA) 5 MG tablet Take 5 mg by mouth daily.      citalopram (CELEXA) 20 MG tablet Take 10 mg by mouth every other day.      donepezil (ARICEPT) 10 MG tablet Take 1 tablet (10 mg total) by mouth at bedtime. 30 tablet 5   donepezil (ARICEPT) 10 MG tablet TAKE 1 TABLET(10 MG) BY MOUTH AT BEDTIME 30 tablet 5   furosemide (LASIX) 20 MG tablet Take 20 mg by mouth daily.      Glucosamine-Chondroitin (GLUCOSAMINE CHONDR COMPLEX PO)  Take by mouth daily.     lisinopril (PRINIVIL,ZESTRIL) 20 MG tablet      ondansetron (ZOFRAN) 4 MG tablet Take 1 tablet (4 mg total) by mouth every 6 (six) hours. 12 tablet 0   vitamin C (ASCORBIC ACID) 500 MG tablet Take 500 mg by mouth daily.      Allergies as of 11/24/2021 - Review Complete 10/22/2021  Allergen Reaction Noted   Alprazolam Rash 01/08/2011    Family History  Problem Relation Age of Onset   Pancreatic cancer Father    Colon cancer Neg Hx    Anesthesia problems Neg Hx    Hypotension Neg Hx    Malignant hyperthermia Neg Hx    Pseudochol deficiency Neg Hx     Social History   Socioeconomic History   Marital status: Widowed    Spouse name: Not on file   Number of children: Not on file   Years of education: Not on file   Highest education level: Not on file  Occupational History   Occupation: Unemployed     Comment: Homemaker  Tobacco Use   Smoking status: Never   Smokeless tobacco: Never  Vaping Use   Vaping Use: Never used  Substance and Sexual Activity   Alcohol use: Yes    Alcohol/week: 0.0 standard drinks    Comment: occasional   Drug  use: No   Sexual activity: Never  Other Topics Concern   Not on file  Social History Narrative   Right handed   1 story    Lives with spouse   Social Determinants of Health   Financial Resource Strain: Not on file  Food Insecurity: Not on file  Transportation Needs: Not on file  Physical Activity: Not on file  Stress: Not on file  Social Connections: Not on file  Intimate Partner Violence: Not on file    Review of Systems: See HPI, all other systems reviewed and were negative   Physical Exam: Vital signs in last 24 hours: Temp:  [98.5 F (36.9 C)] 98.5 F (36.9 C) (02/07 0823) Pulse Rate:  [63-83] 63 (02/07 1000) Resp:  [16] 16 (02/07 1000) BP: (132-140)/(61-69) 140/61 (02/07 1000) SpO2:  [90 %-96 %] 90 % (02/07 1000) Weight:  [61.2 kg] 61.2 kg (02/07 0840)   General:  Alert pleasant 88 year old female in no acute distress. Head:  Normocephalic and atraumatic. Eyes:  No scleral icterus. Conjunctiva pink. Ears:  Normal auditory acuity. Nose:  No deformity, discharge or lesions. Mouth:  Dentition intact. No ulcers or lesions.  Neck:  Supple. No lymphadenopathy or thyromegaly.  Lungs: Breath sounds clear throughout. Heart: Rate and rhythm, soft systolic murmur. Abdomen: Soft, nontender.  Nondistended.  Positive bowel sounds all 4 quadrants.  No palpable mass. Rectal: Dark red blood on exam gloved finger.  Small external hemorrhoid. Musculoskeletal:  Symmetrical without gross deformities.  Pulses:  Normal pulses noted. Extremities:  Without clubbing or edema. Neurologic:  Alert and  oriented x 2.  Speech is clear.  She moves all extremities equally. Skin:  Intact without significant lesions or rashes. Psych:  Alert and cooperative. Normal mood and affect.  Intake/Output from previous day: No intake/output data recorded. Intake/Output this shift: No intake/output data recorded.  Lab Results: Recent Labs    11/24/21 0917  WBC 7.6  HGB 10.3*  HCT 32.4*  PLT  394   BMET Recent Labs    11/24/21 0917  NA 134*  K 4.2  CL 97*  CO2 30  GLUCOSE 116*  BUN 14  CREATININE 0.50  CALCIUM 8.5*   LFT Recent Labs    11/24/21 0917  PROT 6.3*  ALBUMIN 3.2*  AST 17  ALT 12  ALKPHOS 96  BILITOT 0.4   PT/INR Recent Labs    11/24/21 0917  LABPROT 13.7  INR 1.1   Hepatitis Panel No results for input(s): HEPBSAG, HCVAB, HEPAIGM, HEPBIGM in the last 72 hours.    Studies/Results: No results found.  IMPRESSION/PLAN:  58) 88 year old female with recurrent suspected diverticular bleed in 2013 s/p coil embolization of ascending colon branch SMA vessel 08/08/12 who presented to the ED with right red and dark red hematochezia x 2 to 3 episodes earlier this morning.  No associated abdominal or rectal pain.  Hemoglobin 10.3 (baseline Hg 13).  BUN 14.  No further hematochezia since arriving to the ED.  Suspect diverticular bleed. -N.p.o. for now -IV fluids per the hospitalist -Check H&H every 6 hours x 24 hrs -Transfuse PRBCs as needed -Recommend abdominal/pelvic CT angiogram vs tagged RBC scan if demonstrates active GI bleeding -Conservative management/supportive care -No plans for endoscopic evaluation at this time -Await further recommendations per Dr. Henrene Pastor  2) Remote history of GERD -Pantoprazole 40 mg p.o. daily    Patrecia Pour Kennedy-Smith  11/24/2021, 11:48AM  GI ATTENDING   History, laboratories, x-rays, prior colonoscopy reports reviewed.  Patient personally seen and examined.  Agree with comprehensive consultation note as outlined above.  Elderly female with multiple medical problems who presents with painless hematochezia.  Clinical picture most consistent with diverticular bleed, which she has had in the past.  Hemodynamically stable.  Agree with multiple supportive measures as outlined above.  For recurrent acute bleeding would recommend CT angiogram or tagged red blood cell scan.  If positive, would consult interventional radiology  for consideration of embolization therapy.  Hopefully she will do well with supportive measures only.  We will follow-up  Docia Chuck. Geri Seminole., M.D. [facility] Division of Gastroenterology

## 2021-11-24 NOTE — Assessment & Plan Note (Addendum)
Continue Aricept 

## 2021-11-24 NOTE — Assessment & Plan Note (Addendum)
Continue Protonix on discharge

## 2021-11-24 NOTE — H&P (Addendum)
History and Physical    Patient: Susan Henry [id number] DOB: 09/18/1925 DOA: 11/24/2021 DOS: the patient was seen and examined on 11/24/2021 PCP: Glenis Smoker, MD  Patient coming from: Home  Chief Complaint:  Chief Complaint  Patient presents with   Rectal Bleeding   Fatigue    HPI: Susan Henry is a 88 y.o. female with medical history significant of iron deficiency anemia, history of colonic polyps, depression, diverticulosis, anxiety, depression, dementia GERD, history of hemorrhoids, hypertension presented to hospital with complaints of dark maroon stools with weakness and fatigue for 1 day.  Patient's daughter noted that she did have large amounts of dark red clots in the bathroom and had soiled her linens as well which was painless in nature.    Patient's daughter was concerned about  it and the patient was brought into the hospital.  In the ED, vitals were stable.  Hemoglobin was 10.3 compared to hemoglobin of 13.0 on  10/22/2021.  Platelets were adequate.  Patient denies any urinary urgency, frequency or dysuria.  Denies any nausea, vomiting or abdominal pain.  Patient denies any cough, congestion, chest pain or shortness of breath.  Patient denies any dizziness, lightheadedness but has generalized weakness and fatigue.  Patient denies any sore throat, nasal congestion, cough.  Of note, patient had history of COVID a month back and was treated back then.  Denies other respiratory symptoms at this time.   Patient does have history of recurrent lower GI bleed in January 2013 and had undergone colonoscopies at that time.  She was noted to have severe diverticulosis in the ascending to sigmoid colon..  Patient has history of mesenteric angiogram done in the past with embolization of ascending colon of SMA in 2013.  Review of Systems: As mentioned in the history of present illness. All other systems reviewed and are negative. Past Medical History:  Diagnosis Date   Allergy     Seasonal   Anemia    Iron deficiency    Anxiety    Aortic sclerosis    Arthritis    Blood transfusion    Colon polyps    Hx of  adenomatous   Depression    Diverticulosis    GERD (gastroesophageal reflux disease)    Hemorrhoids, internal, with bleeding 01/08/2011   Hypertension    Insomnia    Pneumonia    Reflux    Past Surgical History:  Procedure Laterality Date   CHOLECYSTECTOMY     COLON SURGERY     COLONOSCOPY  01/2011   COLONOSCOPY  11/18/2011   Procedure: COLONOSCOPY;  Surgeon: Owens Loffler, MD;  Location: [facility];  Service: Endoscopy;  Laterality: N/A;   COLONOSCOPY  11/24/2011   Procedure: COLONOSCOPY;  Surgeon: Zenovia Jarred, MD;  Location: [facility];  Service: Gastroenterology;  Laterality: N/A;   ESOPHAGOGASTRODUODENOSCOPY  11/18/2011   Procedure: ESOPHAGOGASTRODUODENOSCOPY (EGD);  Surgeon: Owens Loffler, MD;  Location: [facility];  Service: Endoscopy;  Laterality: N/A;   LOW ANTERIOR BOWEL RESECTION  1996   diverticulitis   UPPER GASTROINTESTINAL ENDOSCOPY     Social History:  reports that she has never smoked. She has never used smokeless tobacco. She reports current alcohol use. She reports that she does not use drugs.  Allergies  Allergen Reactions   Alprazolam Rash    Family History  Problem Relation Age of Onset   Pancreatic cancer Father    Colon cancer Neg Hx    Anesthesia problems Neg Hx    Hypotension Neg Hx  Malignant hyperthermia Neg Hx    Pseudochol deficiency Neg Hx     Prior to Admission medications   Medication Sig Start Date End Date Taking? Authorizing Provider  alendronate (FOSAMAX) 70 MG tablet Take 70 mg by mouth once a week.  09/17/14   [provider]  amLODipine (NORVASC) 10 MG tablet  04/04/15   [provider]  bisoprolol (ZEBETA) 5 MG tablet Take 5 mg by mouth daily.     [provider]  citalopram (CELEXA) 20 MG tablet Take 10 mg by mouth every other day.     [provider]   donepezil (ARICEPT) 10 MG tablet Take 1 tablet (10 mg total) by mouth at bedtime. 05/30/17   Tat, Eustace Quail, DO  donepezil (ARICEPT) 10 MG tablet TAKE 1 TABLET(10 MG) BY MOUTH AT BEDTIME 11/11/17   Tat, Eustace Quail, DO  furosemide (LASIX) 20 MG tablet Take 20 mg by mouth daily.  11/27/13   [provider]  Glucosamine-Chondroitin (GLUCOSAMINE CHONDR COMPLEX PO) Take by mouth daily.    [provider]  lisinopril (PRINIVIL,ZESTRIL) 20 MG tablet  12/11/13   [provider]  ondansetron (ZOFRAN) 4 MG tablet Take 1 tablet (4 mg total) by mouth every 6 (six) hours. 10/22/21   Domenic Moras, PA-C  vitamin C (ASCORBIC ACID) 500 MG tablet Take 500 mg by mouth daily.    [provider]    Physical Exam: Vitals:   11/24/21 0823 11/24/21 0840 11/24/21 1000 11/24/21 1230  BP: 132/69  140/61 121/64  Pulse: 83  63 66  Resp: 16  16 15   Temp: 98.5 F (36.9 C)     TempSrc: Oral     SpO2: 96%  90% 96%  Weight:  61.2 kg    Height:  5\' 6"  (1.676 m)     General:  Average built, not in obvious distress, elderly female, HENT:   Mild pallor noted.  Oral mucosa is moist.  Chest:  Clear breath sounds.  Diminished breath sounds bilaterally. No crackles or wheezes.  CVS: S1 &S2 heard. No murmur.  Regular rate and rhythm. Abdomen: Soft, nontender, nondistended.  Bowel sounds are heard.   Extremities: No cyanosis, clubbing or edema.  Peripheral pulses are palpable. Psych: Alert, awake and communicative, normal mood CNS:  No cranial nerve deficits.  Power equal in all extremities.   Skin: Warm and dry.  No rashes noted.   Data Reviewed: CBC Latest Ref Rng & Units 11/24/2021 11/24/2021 10/22/2021  WBC 4.0 - 10.5 K/uL - 7.6 4.5  Hemoglobin 12.0 - 15.0 g/dL 9.5(L) 10.3(L) 13.0  Hematocrit 36.0 - 46.0 % 30.0(L) 32.4(L) 38.3  Platelets 150 - 400 K/uL - 394 253    BMP Latest Ref Rng & Units 11/24/2021 10/22/2021 04/25/2014  Glucose 70 - 99 mg/dL 116(H) 120(H) -  BUN 8 - 23 mg/dL 14 10 10    Creatinine 0.44 - 1.00 mg/dL 0.50 0.60 0.84  Sodium 135 - 145 mmol/L 134(L) 133(L) -  Potassium 3.5 - 5.1 mmol/L 4.2 3.4(L) -  Chloride 98 - 111 mmol/L 97(L) 94(L) -  CO2 22 - 32 mmol/L 30 28 -  Calcium 8.9 - 10.3 mg/dL 8.5(L) 8.8(L) -     Assessment and Plan: * GI bleed- (present on admission) Patient did have a colonoscopy back in 2013 and notable for diverticular disease.  GI on board.  Continue clears.  Possibility of diverticular bleed.  Might need bleeding scan if continues to bleed.  We will support the symptomatically  hospitalization including blood transfusion if necessary.  Continue Protonix for now.  Monitor H&H every 12 hourly.  Anemia due to acute blood loss- (present on admission) FOBT positive.  Had gross bleeding at home.  Has baseline anemia.  Hemoglobin low by around three-point compared to previous value, we will check hemoglobin and hematocrit every 12 hourly.  Transfuse as necessary.  GERD (gastroesophageal reflux disease) Continue Protonix IV twice daily.  Hypertension- (present on admission) Patient is on amlodipine, bisoprolol and lisinopril at home.  We will continue amlodipine and bisoprolol starting tomorrow.  Hold Lasix for now  Dementia [facility]) On Aricept at home, we will continue.   Advance Care Planning:   Code Status: Full Code patient's family is going to check on living will at home and let us know.  Consults: GI  Family Communication: Updated patient's daughter at bedside.  Severity of Illness: The appropriate patient status for this patient is INPATIENT. Inpatient status is judged to be reasonable and necessary in order to provide the required intensity of service to ensure the patient's safety. The patient's presenting symptoms, physical exam findings, and initial radiographic and laboratory data in the context of their chronic comorbidities is felt to place them at high risk for further clinical deterioration. Furthermore, it is not anticipated  that the patient will be medically stable for discharge from the hospital within 2 midnights of admission.   I certify that at the point of admission it is my clinical judgment that the patient will require inpatient hospital care spanning beyond 2 midnights from the point of admission due to high intensity of service, high risk for further deterioration and high frequency of surveillance required.  Author: Flora Lipps, MD 11/24/2021 2:54 PM  For on call review [email or web address].

## 2021-11-24 NOTE — ED Triage Notes (Signed)
Pt BIBA from home for possible GI bleed, family noticed dark blood and clots in stool. None seen recently but hx of same. Denies pain, NVD, any sickness or abdominal trauma. No blood thinners. Hx dementia, A&Ox2 which is baseline. Able to eat and drink recently.  BP 134/78 HR 85 RR 18 SpO2 97% RA

## 2021-11-25 ENCOUNTER — Encounter (HOSPITAL_COMMUNITY): Payer: Self-pay | Admitting: Internal Medicine

## 2021-11-25 ENCOUNTER — Inpatient Hospital Stay (HOSPITAL_COMMUNITY): Payer: Medicare Other

## 2021-11-25 DIAGNOSIS — K921 Melena: Secondary | ICD-10-CM

## 2021-11-25 DIAGNOSIS — K922 Gastrointestinal hemorrhage, unspecified: Secondary | ICD-10-CM

## 2021-11-25 DIAGNOSIS — K5731 Diverticulosis of large intestine without perforation or abscess with bleeding: Secondary | ICD-10-CM

## 2021-11-25 DIAGNOSIS — D62 Acute posthemorrhagic anemia: Secondary | ICD-10-CM

## 2021-11-25 LAB — PROTIME-INR
INR: 1.1 (ref 0.8–1.2)
Prothrombin Time: 14.5 seconds (ref 11.4–15.2)

## 2021-11-25 LAB — CBC
HCT: 27.9 % — ABNORMAL LOW (ref 36.0–46.0)
Hemoglobin: 8.8 g/dL — ABNORMAL LOW (ref 12.0–15.0)
MCH: 32.8 pg (ref 26.0–34.0)
MCHC: 31.5 g/dL (ref 30.0–36.0)
MCV: 104.1 fL — ABNORMAL HIGH (ref 80.0–100.0)
Platelets: 338 10*3/uL (ref 150–400)
RBC: 2.68 MIL/uL — ABNORMAL LOW (ref 3.87–5.11)
RDW: 13.4 % (ref 11.5–15.5)
WBC: 5.5 10*3/uL (ref 4.0–10.5)
nRBC: 0 % (ref 0.0–0.2)

## 2021-11-25 LAB — BASIC METABOLIC PANEL
Anion gap: 5 (ref 5–15)
BUN: 8 mg/dL (ref 8–23)
CO2: 27 mmol/L (ref 22–32)
Calcium: 8.3 mg/dL — ABNORMAL LOW (ref 8.9–10.3)
Chloride: 103 mmol/L (ref 98–111)
Creatinine, Ser: 0.47 mg/dL (ref 0.44–1.00)
GFR, Estimated: >60 mL/min (ref >60)
Glucose, Bld: 102 mg/dL — ABNORMAL HIGH (ref 70–99)
Potassium: 3.7 mmol/L (ref 3.5–5.1)
Sodium: 135 mmol/L (ref 135–145)

## 2021-11-25 LAB — HEMOGLOBIN AND HEMATOCRIT, BLOOD
HCT: 29 % — ABNORMAL LOW (ref 36.0–46.0)
Hemoglobin: 9.2 g/dL — ABNORMAL LOW (ref 12.0–15.0)

## 2021-11-25 MED ORDER — IOHEXOL 350 MG/ML SOLN
100.0000 mL | Freq: Once | INTRAVENOUS | Status: AC | PRN
  Administered 2021-11-25: 100 mL via INTRAVENOUS

## 2021-11-25 MED ORDER — SODIUM CHLORIDE (PF) 0.9 % IJ SOLN
INTRAMUSCULAR | Status: AC
  Administered 2021-11-26: 3 mL via INTRAVENOUS
  Filled 2021-11-25: qty 50

## 2021-11-25 NOTE — Progress Notes (Addendum)
PROGRESS NOTE    Susan Henry  [id number] DOB: August 13, 1925 DOA: 11/24/2021 PCP: Glenis Smoker, MD    Brief Narrative:  Susan Henry is a 88 y.o. female with medical history significant of iron deficiency anemia, history of colonic polyps, depression, diverticulosis, anxiety, depression, dementia GERD, history of hemorrhoids, hypertension presented to the hospital with complaints of dark maroon stools with weakness and fatigue for 1 day.    In the ED, vitals were stable.  Hemoglobin was 10.3 compared to hemoglobin of 13.0 on  10/22/2021.  Platelets were adequate.   Patient does have history of recurrent lower GI bleed in January 2013 and had undergone colonoscopies at that time.  She was noted to have severe diverticulosis in the ascending to sigmoid colon..  Patient has history of mesenteric angiogram done in the past with embolization of ascending colon of SMA in 2013.  During hospitalization, GI was consulted.  Due to ongoing rectal bleeding, CTA abdomen and pelvis was recommended.     Assessment and Plan: * GI bleed- (present on admission) Patient did have a colonoscopy back in 2013 and notable for diverticular disease.  GI on board.  Possibility of diverticular bleed.  Patient did have episodes of bleeding overnight this.  Communicated with GI today and will obtain CT angiogram GI bleed protocol today.  We will support the symptomatically hospitalization including blood transfusion if necessary.  Continue Protonix for now.  Monitor H&H every 12 hourly.  Anemia due to acute blood loss- (present on admission) FOBT positive.  Had gross bleeding at home and continues to bleed some here in the hospital..  Continue to hemoglobin and hematocrit every 12 hourly.  Hemoglobin of of 8.8 today from 10.2.  Otherwise stable at this time.  No need for transfusion so far but will have less threshold if continues to bleed.  GERD (gastroesophageal reflux disease) Continue Protonix IV twice  daily.  Hypertension- (present on admission) Patient is on amlodipine, bisoprolol and lisinopril at home.  We will continue only with amlodipine for now, continue IV fluids..  Dementia ([place]) Continue Aricept      DVT prophylaxis: Place TED hose Start: 11/24/21 1209   Code Status:     Code Status: Full Code  Disposition: Home Status is: Inpatient Remains inpatient appropriate because: GI bleed, need for further work-up, GI consultation   Family Communication: Spoke with the patient's daughter at bedside.  Consultants:  GI  Procedures:  None so far  Antimicrobials:  None  Anti-infectives (From admission, onward)    None      Subjective: Today, patient was seen and examined at bedside.  Patient did have few small bleeding episodes per rectum.  Denies chest pain, shortness of breath dizziness.  Patient's daughter at bedside.  Objective: Vitals:   11/24/21 1739 11/24/21 2144 11/25/21 0500 11/25/21 0610  BP: (!) 159/67 135/63  (!) 125/58  Pulse: 76 70  66  Resp: 18 18  20   Temp: (!) 97.4 F (36.3 C) 97.9 F (36.6 C)  98 F (36.7 C)  TempSrc: Oral Oral    SpO2: 96% 96%  97%  Weight:   60.9 kg   Height:        Intake/Output Summary (Last 24 hours) at 11/25/2021 1047 Last data filed at 11/25/2021 0900 Gross per 24 hour  Intake 2201.67 ml  Output 900 ml  Net 1301.67 ml   Filed Weights   11/24/21 0840 11/25/21 0500  Weight: 61.2 kg 60.9 kg    Physical  Examination:  General:  Average built, not in obvious distress, elderly female, HENT:   Mild pallor noted.  Oral mucosa is moist.  Chest:  Clear breath sounds.  Diminished breath sounds bilaterally. No crackles or wheezes.  CVS: S1 &S2 heard. No murmur.  Regular rate and rhythm. Abdomen: Soft, nontender, nondistended.  Bowel sounds are heard.   Extremities: No cyanosis, clubbing or edema.  Peripheral pulses are palpable. Psych: Alert, awake and communicative, normal mood CNS:  No cranial nerve deficits.   Power equal in all extremities.   Skin: Warm and dry.  No rashes noted.  Data Reviewed:   CBC: Recent Labs  Lab 11/24/21 0917 11/24/21 1234 11/24/21 1738 11/25/21 0506  WBC 7.6  --   --  5.5  HGB 10.3* 9.5* 10.2* 8.8*  HCT 32.4* 30.0* 32.1* 27.9*  MCV 102.9*  --   --  104.1*  PLT 394  --   --  [id number]    Basic Metabolic Panel: Recent Labs  Lab 11/24/21 0917 11/25/21 0506  NA 134* 135  K 4.2 3.7  CL 97* 103  CO2 30 27  GLUCOSE 116* 102*  BUN 14 8  CREATININE 0.50 0.47  CALCIUM 8.5* 8.3*    Liver Function Tests: Recent Labs  Lab 11/24/21 0917  AST 17  ALT 12  ALKPHOS 96  BILITOT 0.4  PROT 6.3*  ALBUMIN 3.2*     Radiology Studies: DG Chest Port 1 View  Result Date: 11/24/2021 CLINICAL DATA:  Fatigue.  Rectal bleeding. EXAM: PORTABLE CHEST 1 VIEW COMPARISON:  One-view chest x-ray 10/22/2021 FINDINGS: Heart is enlarged. Atherosclerotic calcifications are present at the aortic arch. Changes of COPD are noted. No edema or effusion is present. Moderate hiatal hernia is present. Density noted at the superior mediastinum. IMPRESSION: 1. Density in widening of the superior mediastinum. 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 Other mass lesion not excluded. If clinically indicated, CT of the chest with contrast could be used for further evaluation. 2. No acute cardiopulmonary disease. 3. Moderate hiatal hernia. 4. Cardiomegaly without failure. 5. Aortic atherosclerosis. Electronically Signed   By: San Morelle M.D.   On: 11/24/2021 10:32      LOS: 1 day    Flora Lipps, MD [facility] 11/25/2021, 10:47 AM

## 2021-11-25 NOTE — Hospital Course (Addendum)
Susan Henry is a 88 y.o. female with medical history significant of iron deficiency anemia, history of colonic polyps, depression, diverticulosis, anxiety, depression, dementia GERD, history of hemorrhoids, hypertension presented to the hospital with complaints of dark maroon stools with weakness and fatigue for 1 day.    In the ED, vitals were stable.  Hemoglobin was 10.3 compared to hemoglobin of 13.0 on  10/22/2021.  Platelets were adequate.   Patient does have history of recurrent lower GI bleed in January 2013 and had undergone colonoscopies at that time.  She was noted to have severe diverticulosis in the ascending to sigmoid colon..  Patient has history of mesenteric angiogram done in the past with embolization of ascending colon of SMA in 2013.  During hospitalization, GI was consulted.  Due to ongoing rectal bleeding, CTA abdomen and pelvis was done which was negative for acute extravasation.  Patient also developed urinary retention and required in and out catheterization.  She had significant urinary retention.  Family concerned about urological issues and urology was consulted during hospitalization.

## 2021-11-25 NOTE — Plan of Care (Signed)
  Problem: Activity: Goal: Risk for activity intolerance will decrease Outcome: Progressing   Problem: Coping: Goal: Level of anxiety will decrease Outcome: Progressing   Problem: Elimination: Goal: Will not experience complications related to bowel motility Outcome: Progressing Goal: Will not experience complications related to urinary retention Outcome: Progressing   

## 2021-11-25 NOTE — Progress Notes (Addendum)
[facility] Progress Note  CC:  Anemia, bloody stool  Subjective: No N/V or abdominal pain. She passed 2 bloody BMs last night. She had 2 more episodes of blood per the rectum earlier this morning. Her RN reported the first episodes consisted of a small amount of dark red blood with clots and the second episode was a larger volume of dark red blood with clots and a small amount of brown stool. Family at the beside.   Objective:  Vital signs in last 24 hours: Temp:  [97.4 F (36.3 C)-98 F (36.7 C)] 98 F (36.7 C) (02/08 0610) Pulse Rate:  [65-76] 66 (02/08 0610) Resp:  [15-20] 20 (02/08 0610) BP: (121-159)/(58-68) 125/58 (02/08 0610) SpO2:  [95 %-97 %] 97 % (02/08 0610) Weight:  [60.9 kg] 60.9 kg (02/08 0500) Last BM Date: 11/24/21 (Last BM Bloody per Family) General:  Alert 88 year old female in NAD. Heart: RRR, soft systolic murmur.  Pulm: Breath sounds clear throughout.  Abdomen: Soft, nontender.  Nondistended.  Positive bowel sounds all 4 quadrants.  No palpable mass. Bed pad dry without blood or stool at this time.  Extremities:  Without edema. Neurologic:  Alert and oriented x 2. Speech is clear. Moves all extremities.  Psych:  Alert and cooperative. Normal mood and affect.  Intake/Output from previous day: 02/07 0701 - 02/08 0700 In: 1941.7 [P.O.:360; I.V.:1581.7] Out: 600 [Urine:600] Intake/Output this shift: Total I/O In: 260 [P.O.:260] Out: 300 [Urine:300]  Lab Results: Recent Labs    11/24/21 0917 11/24/21 1234 11/24/21 1738 11/25/21 0506  WBC 7.6  --   --  5.5  HGB 10.3* 9.5* 10.2* 8.8*  HCT 32.4* 30.0* 32.1* 27.9*  PLT 394  --   --  338   BMET Recent Labs    11/24/21 0917 11/25/21 0506  NA 134* 135  K 4.2 3.7  CL 97* 103  CO2 30 27  GLUCOSE 116* 102*  BUN 14 8  CREATININE 0.50 0.47  CALCIUM 8.5* 8.3*   LFT Recent Labs    11/24/21 0917  PROT 6.3*  ALBUMIN 3.2*  AST 17  ALT 12  ALKPHOS 96  BILITOT 0.4    PT/INR Recent Labs    11/24/21 0917 11/25/21 0506  LABPROT 13.7 14.5  INR 1.1 1.1   Hepatitis Panel No results for input(s): HEPBSAG, HCVAB, HEPAIGM, HEPBIGM in the last 72 hours.  DG Chest Port 1 View  Result Date: 11/24/2021 CLINICAL DATA:  Fatigue.  Rectal bleeding. EXAM: PORTABLE CHEST 1 VIEW COMPARISON:  One-view chest x-ray 10/22/2021 FINDINGS: Heart is enlarged. Atherosclerotic calcifications are present at the aortic arch. Changes of COPD are noted. No edema or effusion is present. Moderate hiatal hernia is present. Density noted at the superior mediastinum. IMPRESSION: 1. Density in widening of the superior mediastinum. 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 Other mass lesion not excluded. If clinically indicated, CT of the chest with contrast could be used for further evaluation. 2. No acute cardiopulmonary disease. 3. Moderate hiatal hernia. 4. Cardiomegaly without failure. 5. Aortic atherosclerosis. Electronically Signed   By: San Morelle M.D.   On: 11/24/2021 10:32    Assessment / Plan:  75) 88 year old female with recurrent suspected diverticular bleed in 2013 s/p coil embolization of ascending colon branch SMA vessel 08/08/12 admitted to the hospital 11/24/2021 with bright red and dark red hematochezia x 2 to 3 episodes. No associated abdominal or rectal pain.  Hemoglobin 10.3 (baseline Hg 13).  BUN 14.  Suspect diverticular bleed. She had 2 episodes small volume dark red blood hematochezia last night and 2 episodes of small to moderate volume dark red hematochezia with clots this morning. Today Hg dropped to 8.8. Abd/pelvic CTA ordered stat. She remains hemodynamically stable.  -NPO -Await abdominal/pelvic CT a results, will need to contact IR if active GI bleeding identified -Monitor H&H closely -Transfuse for hemoglobin less than 7 or as needed if she develops rapid active bleeding -No plans for endoscopic evaluation at this time -Further recommendations per  Dr.Jennfier Abdulla   2) Remote history of GERD -Pantoprazole 40 mg p.o. daily  Principal Problem:   GI bleed Active Problems:   Hypertension   Anemia due to acute blood loss   GERD (gastroesophageal reflux disease)   Dementia ([place])   LOS: 1 day   Noralyn Pick  11/25/2021, 10:59AM  GI ATTENDING  Interval history data reviewed.  Patient seen and examined.  Agree with interval progress note as outlined above.  The patient has had some mild to moderate intermittent bleeding as described above.  Drifting hemoglobin.  Remains hemodynamically stable.  Agree with plans for CT angiogram.  May need IR embolization if positive.  Otherwise, ongoing supportive care, including transfusions as needed, and this 88 year old.  We will follow.  Docia Chuck. Geri Semin[place]., M.D. [facility] Division of Gastroenterology  ADDENDUM: CTA does not show active extravasation.  Multiple incidental findings.  Colonic wall thickening throughout various regions noted.  May be due to diverticular disease.  Clinically, not acting like "colitis".  Continue supportive measures for probable diverticular bleed in a patient with a history of the same.  Docia Chuck. Geri Seminole., M.D. [facility] Division of Gastroenterology

## 2021-11-26 DIAGNOSIS — K921 Melena: Secondary | ICD-10-CM

## 2021-11-26 DIAGNOSIS — K5731 Diverticulosis of large intestine without perforation or abscess with bleeding: Secondary | ICD-10-CM

## 2021-11-26 DIAGNOSIS — K838 Other specified diseases of biliary tract: Secondary | ICD-10-CM

## 2021-11-26 DIAGNOSIS — E876 Hypokalemia: Secondary | ICD-10-CM | POA: Diagnosis not present

## 2021-11-26 DIAGNOSIS — R338 Other retention of urine: Secondary | ICD-10-CM

## 2021-11-26 LAB — BASIC METABOLIC PANEL
Anion gap: 7 (ref 5–15)
BUN: <5 mg/dL — ABNORMAL LOW (ref 8–23)
CO2: 27 mmol/L (ref 22–32)
Calcium: 8.5 mg/dL — ABNORMAL LOW (ref 8.9–10.3)
Chloride: 102 mmol/L (ref 98–111)
Creatinine, Ser: 0.44 mg/dL (ref 0.44–1.00)
GFR, Estimated: >60 mL/min (ref >60)
Glucose, Bld: 105 mg/dL — ABNORMAL HIGH (ref 70–99)
Potassium: 3.2 mmol/L — ABNORMAL LOW (ref 3.5–5.1)
Sodium: 136 mmol/L (ref 135–145)

## 2021-11-26 LAB — CBC
HCT: 30.2 % — ABNORMAL LOW (ref 36.0–46.0)
Hemoglobin: 9.8 g/dL — ABNORMAL LOW (ref 12.0–15.0)
MCH: 32.9 pg (ref 26.0–34.0)
MCHC: 32.5 g/dL (ref 30.0–36.0)
MCV: 101.3 fL — ABNORMAL HIGH (ref 80.0–100.0)
Platelets: 377 10*3/uL (ref 150–400)
RBC: 2.98 MIL/uL — ABNORMAL LOW (ref 3.87–5.11)
RDW: 13.1 % (ref 11.5–15.5)
WBC: 6.2 10*3/uL (ref 4.0–10.5)
nRBC: 0 % (ref 0.0–0.2)

## 2021-11-26 LAB — HEMOGLOBIN AND HEMATOCRIT, BLOOD
HCT: 27 % — ABNORMAL LOW (ref 36.0–46.0)
Hemoglobin: 8.8 g/dL — ABNORMAL LOW (ref 12.0–15.0)

## 2021-11-26 LAB — MAGNESIUM: Magnesium: 1.9 mg/dL (ref 1.7–2.4)

## 2021-11-26 MED ORDER — POTASSIUM CHLORIDE CRYS ER 20 MEQ PO TBCR
40.0000 meq | EXTENDED_RELEASE_TABLET | Freq: Four times a day (QID) | ORAL | Status: AC
  Administered 2021-11-26 (×2): 40 meq via ORAL
  Filled 2021-11-26 (×2): qty 2

## 2021-11-26 NOTE — Plan of Care (Signed)
  Problem: Clinical Measurements: Goal: Respiratory complications will improve Outcome: Progressing Goal: Cardiovascular complication will be avoided Outcome: Progressing   Problem: Nutrition: Goal: Adequate nutrition will be maintained Outcome: Progressing   

## 2021-11-26 NOTE — Assessment & Plan Note (Addendum)
Patient required in and out catheterization with significant urinary retention.  Urology was consulted due to concerns for urinary retention.  Patient did have difficulty catheterization and difficult anatomy but subsequently Foley catheter was placed. Urology has plans to follow-up in 2 weeks as outpatient with a Foley catheter for voiding trial.

## 2021-11-26 NOTE — Progress Notes (Addendum)
[facility] Progress Note  CC:  Painless hematochezia  Subjective: She did not sleep well last night due to discomfort from the purewick device. No hematochezia since yesterday morning. No BM yet today. Her daughter reported her mother complained of lower abdominal pain last nigh. Patient denies having any abdominal pain at this time and she does not remember having any abdominal pain last night. She is just waking up for breakfast at this time. No CP or SOB.  Objective:   Vital signs in last 24 hours: Temp:  [98.2 F (36.8 C)-98.3 F (36.8 C)] 98.2 F (36.8 C) (02/09 0720) Pulse Rate:  [75-80] 78 (02/09 0720) Resp:  [16] 16 (02/08 1413) BP: (125-158)/(58-68) 151/67 (02/09 0720) SpO2:  [96 %] 96 % (02/08 2031) Weight:  [53.7 kg] 53.7 kg (02/09 0720) Last BM Date: 11/25/21 General:  88 year old female, arousable in NAD. Heart: RRR, soft systolic murmur.  Pulm: Few inspiratory wheezes otherwise clear.  Abdomen: Soft, nontender. + BS x 4 quads.  Extremities:  Without edema. Neurologic:  Alert and  oriented x 4. Grossly normal neurologically. Psych:  Alert and cooperative. Normal mood and affect.  Intake/Output from previous day: 02/08 0701 - 02/09 0700 In: 1583 [P.O.:380; I.V.:1203] Out: 1500 [Urine:1500] Intake/Output this shift: Total I/O In: 816.3 [I.V.:816.3] Out: -   Lab Results: Recent Labs    11/24/21 0917 11/24/21 1234 11/25/21 0506 11/25/21 1732 11/26/21 0432  WBC 7.6  --  5.5  --  6.2  HGB 10.3*   < > 8.8* 9.2* 9.8*  HCT 32.4*   < > 27.9* 29.0* 30.2*  PLT 394  --  338  --  377   < > = values in this interval not displayed.   BMET Recent Labs    11/24/21 0917 11/25/21 0506 11/26/21 0432  NA 134* 135 136  K 4.2 3.7 3.2*  CL 97* 103 102  CO2 30 27 27   GLUCOSE 116* 102* 105*  BUN 14 8 <5*  CREATININE 0.50 0.47 0.44  CALCIUM 8.5* 8.3* 8.5*   LFT Recent Labs    11/24/21 0917  PROT 6.3*  ALBUMIN 3.2*  AST 17  ALT 12   ALKPHOS 96  BILITOT 0.4   PT/INR Recent Labs    11/24/21 0917 11/25/21 0506  LABPROT 13.7 14.5  INR 1.1 1.1   Hepatitis Panel No results for input(s): HEPBSAG, HCVAB, HEPAIGM, HEPBIGM in the last 72 hours.  DG Chest Port 1 View  Result Date: 11/24/2021 CLINICAL DATA:  Fatigue.  Rectal bleeding. EXAM: PORTABLE CHEST 1 VIEW COMPARISON:  One-view chest x-ray 10/22/2021 FINDINGS: Heart is enlarged. Atherosclerotic calcifications are present at the aortic arch. Changes of COPD are noted. No edema or effusion is present. Moderate hiatal hernia is present. Density noted at the superior mediastinum. IMPRESSION: 1. Density in widening of the superior mediastinum. 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 Other mass lesion not excluded. If clinically indicated, CT of the chest with contrast could be used for further evaluation. 2. No acute cardiopulmonary disease. 3. Moderate hiatal hernia. 4. Cardiomegaly without failure. 5. Aortic atherosclerosis. Electronically Signed   By: San Morelle M.D.   On: 11/24/2021 10:32   CT ANGIO GI BLEED  Result Date: 11/25/2021 CLINICAL DATA:  Dark maroon stools with weakness and fatigue for 1 day, hemoglobin 10.3 today, was 13.0 on 10/22/2021, adequate platelets. History of hypertension, colonic diverticulosis, GERD EXAM: CTA ABDOMEN AND PELVIS WITHOUT AND WITH CONTRAST TECHNIQUE: Multidetector CT imaging of the  abdomen and pelvis was performed using the standard protocol during bolus administration of intravenous contrast. Multiplanar reconstructed images and MIPs were obtained and reviewed to evaluate the vascular anatomy. RADIATION DOSE REDUCTION: This exam was performed according to the departmental dose-optimization program which includes automated exposure control, adjustment of the mA and/or kV according to patient size and/or use of iterative reconstruction technique. CONTRAST:  145mL OMNIPAQUE IOHEXOL 350 MG/ML SOLN IV COMPARISON:  None FINDINGS: VASCULAR  Aorta: Atherosclerotic calcifications aorta without aneurysm or dissection. Celiac: Widely patent SMA: Calcified plaque at proximal SMA without significant narrowing Renals: Plaque at the origins and proximal portions of the renal arteries bilaterally greater on LEFT. Less than 50% narrowing at RIGHT renal artery. Greater than 50% narrowing LEFT renal artery. IMA: Patent Inflow: Scattered calcified plaques within the iliac arteries bilaterally. Iliac arteries normal caliber. Proximal Outflow: Plaques at common femoral arteries and femoral bifurcations bilaterally without significant narrowing. Veins: Venous structures patent. Review of the MIP images confirms the above findings. NON-VASCULAR Lower chest: 2 mm LEFT lower lobe nodule image 27. Subsegmental atelectasis BILATERAL lower lobes greater on RIGHT. Hepatobiliary: Gallbladder surgically absent. No hepatic mass or nodularity. Six low insertion of cystic duct stump. Dilated CBD 13 mm diameter with mild intrahepatic biliary dilatation. Rounded filling defect distal CBD 6 mm diameter consistent with choledocholithiasis. Pancreas: Minimal pancreatic ductal dilatation.  No pancreatic mass. Spleen: Few calcified granulomata within spleen.  No mass lesion. Adrenals/Urinary Tract: Cortical thinning of both kidneys. Tiny BILATERAL renal cysts. Additional nonspecific intermediate attenuation nodule inferior pole LEFT kidney 11 x 9 mm image 36. Well distended bladder. Unremarkable ureters. No urinary tract calcification. Stomach/Bowel: Stomach decompressed with moderate to large hiatal hernia noted. Duodenal bulb and sweep normal appearance. Small bowel loops assessment limited by respiratory motion artifacts. No gross abnormality, dilatation or wall thickening seen. Diffuse colonic diverticulosis. Wall thickening of colon from hepatic flexure through mid descending colon consistent with colitis. Additional wall thickening at proximal sigmoid colon no definite active GI  contrast extravasation seen to suggest active GI bleeding, though multiple colonic diverticula contain high attenuation material likely representing inspissated secretions and or prior contrast. Sigmoid anastomosis identified. Normal appendix. Lymphatic: Few scattered normal sized retroperitoneal lymph nodes. No abdominal or pelvic adenopathy. Reproductive: Unremarkable uterus and adnexa Other: No free air or free fluid.  No hernia. Musculoskeletal: Degenerative disc and facet disease changes lower lumbar spine. Marked compression fracture of T11 vertebral body, age indeterminate but new since 01/10/2018. IMPRESSION: Greater than 50% narrowing of the LEFT renal artery. Scattered atherosclerotic calcifications aorta, iliac arteries, femoral arteries. Less than 50% narrowing of RIGHT renal artery and proximal SMA. No definite active GI bleeding identified. Diffuse colonic diverticulosis without evidence of acute diverticulitis. Wall thickening of the colon from hepatic flexure through mid descending colon consistent with colitis. Additional wall thickening of proximal sigmoid colon, may represent colitis as well but mass lesion is not excluded; follow-up colonoscopy recommended. Intrahepatic and extrahepatic biliary dilatation with a 6 mm diameter distal CBD calculus consistent with choledocholithiasis. Additional nonspecific intermediate attenuation nodule inferior pole LEFT kidney 11 x 9 mm; recommend ultrasound characterization. Marked compression fracture of T11 vertebral body, age indeterminate but new since 01/10/2018. Moderate to large hiatal hernia. Aortic Atherosclerosis (ICD10-I70.0). Electronically Signed   By: Lavonia Dana M.D.   On: 11/25/2021 12:08    Assessment / Plan:  74) 88 year old female with recurrent suspected diverticular bleed in 2013 s/p coil embolization of ascending colon branch SMA vessel 08/08/12 admitted to  the hospital 11/24/2021 with bright red and dark red hematochezia x 2 to 3  episodes. No associated abdominal or rectal pain.  Hemoglobin 10.3 (baseline Hg 13).  BUN 14. Suspect diverticular bleed. She had several episodes of dark red hematochezia yesterday morning -> Hg dropped to 8.8. Abd/pelvic CTA without evidence of active GI bleeding, diverticulosis without diverticulitis and wall thickening to the sigmoid colon was noted, mass could not be excluded. Today Hg stable at 9.8. -Full liquid diet this am, if tolerated and no further hematochezia will advance to soft diet for lunch -OOB to chair -No plans for a colonoscopy at this time -Hopefully can be discharged home later today if she tolerates a soft diet without recurrence of hematochezia   2) Abd/pelvic CTA 11/25/2021 identified a dilated CBD with mild intrahepatic biliary dilatation with a filling defect to the distal CBD consistent with choledocholithiasis and minimal pancreatic ductal dilatation. Past cholecystectomy. Normal LFTs. Asymptomatic.  -Consider abdominal MRI/MRCP if she becomes symptomatic, await further recommendations per Dr. Henrene Pastor    2) Remote history of GERD -Pantoprazole 40 mg p.o. daily   LOS: 2 days   Noralyn Pick  11/26/2021, 10:21AM  GI ATTENDING  Interval history data reviewed.  Patient seen and examined.  Agree with interval progress note.  No further bleeding.  CTA yesterday negative.  Advancing diet.  Hopefully moving towards discharge.  No further recommendations from GI standpoint.  Please contact us for questions or problems.  Thanks  Docia Chuck. Geri Seminole., M.D. [facility] Division of Gastroenterology

## 2021-11-26 NOTE — Progress Notes (Addendum)
PROGRESS NOTE    Susan Henry  [id number] DOB: 02-05-1925 DOA: 11/24/2021 PCP: Glenis Smoker, MD    Brief Narrative:  Susan Henry is a 88 y.o. female with medical history significant of iron deficiency anemia, history of colonic polyps, depression, diverticulosis, anxiety, depression, dementia GERD, history of hemorrhoids, hypertension presented to the hospital with complaints of dark maroon stools with weakness and fatigue for 1 day.    In the ED, vitals were stable.  Hemoglobin was 10.3 compared to hemoglobin of 13.0 on  10/22/2021.  Platelets were adequate.   Patient does have history of recurrent lower GI bleed in January 2013 and had undergone colonoscopies at that time.  She was noted to have severe diverticulosis in the ascending to sigmoid colon..  Patient has history of mesenteric angiogram done in the past with embolization of ascending colon of SMA in 2013.  During hospitalization, GI was consulted.  Due to ongoing rectal bleeding, CTA abdomen and pelvis was done which was negative for acute extravasation.  Patient also developed urinary retention and required in and out catheterization.    Assessment and Plan: * Acute GI bleeding- (present on admission) Patient did have a colonoscopy back in 2013 and notable for diverticular disease.  GI on board.  Possibility of diverticular bleed.  CT angiogram GI bleed protocol without any active signs of bleeding.    Continue Protonix for now.  Monitor H&H every 12 hourly.  Latest hemoglobin trend of 9.8 from 9.2-8.8-10.2.  Patient has been started on full liquids today.  We will continue to advance as tolerated.  No plans for colonoscopy at this time.  Anemia due to acute blood loss- (present on admission) FOBT positive.  Stable hemoglobin on trend, no need for transfusion so far but will have less threshold if continues to bleed.  GERD (gastroesophageal reflux disease) Continue Protonix  Hypertension- (present on  admission) Patient is on amlodipine, bisoprolol and lisinopril at home.  We currently on amlodipine, decrease normal saline to 50 mill per hour.  Hypokalemia Potassium was 3.2 today.  We will continue oral potassium chloride supplements x2 today.  Check levels in am.  Acute urinary retention Patient complains of suprapubic discomfort.  Bladder scan showed significant retention.  We will perform in and out cath and monitor closely.  Discontinue IV fluids at this time.  Dementia ([place]) Continue Aricept   DVT prophylaxis: Place TED hose Start: 11/24/21 1209  Code Status:     Code Status: Full Code  Disposition: Home Status is: Inpatient  Remains inpatient appropriate because: Monitoring of GI bleed, acute urinary retention today.   Family Communication:  Spoke with the patient's daughter at bedside.  Consultants:  GI  Procedures:  In and out catheterization.  Antimicrobials:  None  Anti-infectives (From admission, onward)    None      Subjective: Today, patient was seen and examined at bedside.  No further report of blood per rectum.  Patient complained of mild abdominal pain on the lower side.  Bladder scan showed significant urinary retention.   Objective: Vitals:   11/25/21 1702 11/25/21 2031 11/26/21 0720 11/26/21 1149  BP: (!) 141/62 (!) 125/58 (!) 151/67 123/78  Pulse:  80 78 96  Resp:    17  Temp:  98.3 F (36.8 C) 98.2 F (36.8 C) 97.9 F (36.6 C)  TempSrc:  Oral Oral Oral  SpO2:  96%  98%  Weight:   53.7 kg   Height:  Intake/Output Summary (Last 24 hours) at 11/26/2021 1430 Last data filed at 11/26/2021 1015 Gross per 24 hour  Intake 2142.32 ml  Output 1200 ml  Net 942.32 ml   Filed Weights   11/24/21 0840 11/25/21 0500 11/26/21 0720  Weight: 61.2 kg 60.9 kg 53.7 kg    Physical Examination:  General:  Average built, not in obvious distress, elderly female HENT:   Mild pallor noted.  Oral mucosa is moist.  Chest:  Clear breath sounds.   Diminished breath sounds bilaterally. No crackles or wheezes.  CVS: S1 &S2 heard. No murmur.  Regular rate and rhythm. Abdomen: Soft, nontender, nondistended.  Bowel sounds are heard.  Distended bladder. Extremities: No cyanosis, clubbing or edema.  Peripheral pulses are palpable. Psych: Alert, awake and communicative, CNS:  No cranial nerve deficits.  Power equal in all extremities.   Skin: Warm and dry.  No rashes noted.  Data Reviewed:   CBC: Recent Labs  Lab 11/24/21 0917 11/24/21 1234 11/24/21 1738 11/25/21 0506 11/25/21 1732 11/26/21 0432  WBC 7.6  --   --  5.5  --  6.2  HGB 10.3* 9.5* 10.2* 8.8* 9.2* 9.8*  HCT 32.4* 30.0* 32.1* 27.9* 29.0* 30.2*  MCV 102.9*  --   --  104.1*  --  101.3*  PLT 394  --   --  338  --  [id number]    Basic Metabolic Panel: Recent Labs  Lab 11/24/21 0917 11/25/21 0506 11/26/21 0432  NA 134* 135 136  K 4.2 3.7 3.2*  CL 97* 103 102  CO2 30 27 27   GLUCOSE 116* 102* 105*  BUN 14 8 <5*  CREATININE 0.50 0.47 0.44  CALCIUM 8.5* 8.3* 8.5*  MG  --   --  1.9    Liver Function Tests: Recent Labs  Lab 11/24/21 0917  AST 17  ALT 12  ALKPHOS 96  BILITOT 0.4  PROT 6.3*  ALBUMIN 3.2*     Radiology Studies: CT ANGIO GI BLEED  Result Date: 11/25/2021 CLINICAL DATA:  Dark maroon stools with weakness and fatigue for 1 day, hemoglobin 10.3 today, was 13.0 on 10/22/2021, adequate platelets. History of hypertension, colonic diverticulosis, GERD EXAM: CTA ABDOMEN AND PELVIS WITHOUT AND WITH CONTRAST TECHNIQUE: Multidetector CT imaging of the abdomen and pelvis was performed using the standard protocol during bolus administration of intravenous contrast. Multiplanar reconstructed images and MIPs were obtained and reviewed to evaluate the vascular anatomy. RADIATION DOSE REDUCTION: This exam was performed according to the departmental dose-optimization program which includes automated exposure control, adjustment of the mA and/or kV according to patient size  and/or use of iterative reconstruction technique. CONTRAST:  152mL OMNIPAQUE IOHEXOL 350 MG/ML SOLN IV COMPARISON:  None FINDINGS: VASCULAR Aorta: Atherosclerotic calcifications aorta without aneurysm or dissection. Celiac: Widely patent SMA: Calcified plaque at proximal SMA without significant narrowing Renals: Plaque at the origins and proximal portions of the renal arteries bilaterally greater on LEFT. Less than 50% narrowing at RIGHT renal artery. Greater than 50% narrowing LEFT renal artery. IMA: Patent Inflow: Scattered calcified plaques within the iliac arteries bilaterally. Iliac arteries normal caliber. Proximal Outflow: Plaques at common femoral arteries and femoral bifurcations bilaterally without significant narrowing. Veins: Venous structures patent. Review of the MIP images confirms the above findings. NON-VASCULAR Lower chest: 2 mm LEFT lower lobe nodule image 27. Subsegmental atelectasis BILATERAL lower lobes greater on RIGHT. Hepatobiliary: Gallbladder surgically absent. No hepatic mass or nodularity. Six low insertion of cystic duct stump. Dilated CBD 13 mm diameter with mild  intrahepatic biliary dilatation. Rounded filling defect distal CBD 6 mm diameter consistent with choledocholithiasis. Pancreas: Minimal pancreatic ductal dilatation.  No pancreatic mass. Spleen: Few calcified granulomata within spleen.  No mass lesion. Adrenals/Urinary Tract: Cortical thinning of both kidneys. Tiny BILATERAL renal cysts. Additional nonspecific intermediate attenuation nodule inferior pole LEFT kidney 11 x 9 mm image 36. Well distended bladder. Unremarkable ureters. No urinary tract calcification. Stomach/Bowel: Stomach decompressed with moderate to large hiatal hernia noted. Duodenal bulb and sweep normal appearance. Small bowel loops assessment limited by respiratory motion artifacts. No gross abnormality, dilatation or wall thickening seen. Diffuse colonic diverticulosis. Wall thickening of colon from  hepatic flexure through mid descending colon consistent with colitis. Additional wall thickening at proximal sigmoid colon no definite active GI contrast extravasation seen to suggest active GI bleeding, though multiple colonic diverticula contain high attenuation material likely representing inspissated secretions and or prior contrast. Sigmoid anastomosis identified. Normal appendix. Lymphatic: Few scattered normal sized retroperitoneal lymph nodes. No abdominal or pelvic adenopathy. Reproductive: Unremarkable uterus and adnexa Other: No free air or free fluid.  No hernia. Musculoskeletal: Degenerative disc and facet disease changes lower lumbar spine. Marked compression fracture of T11 vertebral body, age indeterminate but new since 01/10/2018. IMPRESSION: Greater than 50% narrowing of the LEFT renal artery. Scattered atherosclerotic calcifications aorta, iliac arteries, femoral arteries. Less than 50% narrowing of RIGHT renal artery and proximal SMA. No definite active GI bleeding identified. Diffuse colonic diverticulosis without evidence of acute diverticulitis. Wall thickening of the colon from hepatic flexure through mid descending colon consistent with colitis. Additional wall thickening of proximal sigmoid colon, may represent colitis as well but mass lesion is not excluded; follow-up colonoscopy recommended. Intrahepatic and extrahepatic biliary dilatation with a 6 mm diameter distal CBD calculus consistent with choledocholithiasis. Additional nonspecific intermediate attenuation nodule inferior pole LEFT kidney 11 x 9 mm; recommend ultrasound characterization. Marked compression fracture of T11 vertebral body, age indeterminate but new since 01/10/2018. Moderate to large hiatal hernia. Aortic Atherosclerosis (ICD10-I70.0). Electronically Signed   By: Lavonia Dana M.D.   On: 11/25/2021 12:08      LOS: 2 days    Flora Lipps, MD [facility] 11/26/2021, 2:30 PM

## 2021-11-26 NOTE — Assessment & Plan Note (Addendum)
Improved after supplementation.  Potassium of 3.7 today.

## 2021-11-27 LAB — HEMOGLOBIN AND HEMATOCRIT, BLOOD
HCT: 26.7 % — ABNORMAL LOW (ref 36.0–46.0)
Hemoglobin: 8.9 g/dL — ABNORMAL LOW (ref 12.0–15.0)

## 2021-11-27 LAB — URINALYSIS, ROUTINE W REFLEX MICROSCOPIC
Bilirubin Urine: NEGATIVE
Glucose, UA: NEGATIVE mg/dL
Ketones, ur: NEGATIVE mg/dL
Nitrite: NEGATIVE
Protein, ur: NEGATIVE mg/dL
Specific Gravity, Urine: 1.011 (ref 1.005–1.030)
WBC, UA: >50 WBC/hpf — ABNORMAL HIGH (ref 0–5)
pH: 5 (ref 5.0–8.0)

## 2021-11-27 LAB — BASIC METABOLIC PANEL
Anion gap: 7 (ref 5–15)
BUN: 9 mg/dL (ref 8–23)
CO2: 24 mmol/L (ref 22–32)
Calcium: 8.4 mg/dL — ABNORMAL LOW (ref 8.9–10.3)
Chloride: 104 mmol/L (ref 98–111)
Creatinine, Ser: 0.67 mg/dL (ref 0.44–1.00)
GFR, Estimated: >60 mL/min (ref >60)
Glucose, Bld: 98 mg/dL (ref 70–99)
Potassium: 4.4 mmol/L (ref 3.5–5.1)
Sodium: 135 mmol/L (ref 135–145)

## 2021-11-27 MED ORDER — PANTOPRAZOLE SODIUM 40 MG PO TBEC
40.0000 mg | DELAYED_RELEASE_TABLET | Freq: Every day | ORAL | Status: DC
  Administered 2021-11-28: 40 mg via ORAL
  Filled 2021-11-27 (×2): qty 1

## 2021-11-27 MED ORDER — CHLORHEXIDINE GLUCONATE CLOTH 2 % EX PADS
6.0000 | MEDICATED_PAD | Freq: Every day | CUTANEOUS | Status: DC
  Administered 2021-11-28: 6 via TOPICAL

## 2021-11-27 NOTE — Progress Notes (Signed)
Pt was bladdder scanned per MD order, scan showed 399, 317, 499 in the bladder. Attempted 3x to in and out cath pt with no success due to difficult anatomy. Md made aware Val Eagle

## 2021-11-27 NOTE — Plan of Care (Signed)
  Problem: Clinical Measurements: Goal: Respiratory complications will improve Outcome: Progressing   Problem: Activity: Goal: Risk for activity intolerance will decrease Outcome: Progressing   Problem: Nutrition: Goal: Adequate nutrition will be maintained Outcome: Progressing   

## 2021-11-27 NOTE — Progress Notes (Signed)
PROGRESS NOTE    Susan Henry  [id number] DOB: Oct 08, 1925 DOA: 11/24/2021 PCP: Glenis Smoker, MD    Brief Narrative:  Susan Henry is a 88 y.o. female with medical history significant of iron deficiency anemia, history of colonic polyps, depression, diverticulosis, anxiety, depression, dementia GERD, history of hemorrhoids, hypertension presented to the hospital with complaints of dark maroon stools with weakness and fatigue for 1 day.    In the ED, vitals were stable.  Hemoglobin was 10.3 compared to hemoglobin of 13.0 on  10/22/2021.  Platelets were adequate.   Patient does have history of recurrent lower GI bleed in January 2013 and had undergone colonoscopies at that time.  She was noted to have severe diverticulosis in the ascending to sigmoid colon..  Patient has history of mesenteric angiogram done in the past with embolization of ascending colon of SMA in 2013.  During hospitalization, GI was consulted.  Due to ongoing rectal bleeding, CTA abdomen and pelvis was done which was negative for acute extravasation.  Patient also developed urinary retention and required in and out catheterization.  She had significant urinary retention.  Family concerned about urological issues and urology has been consulted at this time    Assessment and Plan: * Acute GI bleeding- (present on admission) Patient did have a colonoscopy back in 2013 and notable for diverticular disease.  GI on board.  Possibility of diverticular bleed.  CT angiogram GI bleed protocol without any active signs of bleeding.  Hemoglobin has remained stable.  Latest hemoglobin of 8.9 from 8.8.  Changed to oral Protonix.Marland Kitchen  Has been changed to soft diet.  Acute urinary retention In and out cath yesterday revealed significantly significant urinary retention.  Patient's family stating that the patient has been having urinary trouble for the last 1 month or so and had retention issues in the past.  Family is concerned  about infection and urological evaluation while in the hospital.  Spoke with urology on-call Dr. Diona Fanti today regarding further evaluation.  Anemia due to acute blood loss- (present on admission) FOBT positive.  Stable hemoglobin.  No further bleed.  GERD (gastroesophageal reflux disease) Continue Protonix  Hypertension- (present on admission) Patient is on amlodipine, bisoprolol and lisinopril at home.  Currently on amlodipine.  Off IV fluids.  Hypokalemia Improved after supplementation.  Potassium of 4.4 today.  Dementia ([place]) Continue Aricept   DVT prophylaxis: Place TED hose Start: 11/24/21 1209  Code Status:     Code Status: Full Code  Disposition: Home Status is: Inpatient  Remains inpatient appropriate because: Monitoring of GI bleed, acute urinary retention, urology consultation   Family Communication:  Again spoke with the patient's daughter at bedside  Consultants:  GI  Procedures:  In and out catheterization.  Antimicrobials:  None  Anti-infectives (From admission, onward)    None      Subjective: Today, patient was seen and examined at bedside.  Patient has not been able to urinate after in and out catheterization yesterday.   Objective: Vitals:   11/26/21 0720 11/26/21 1149 11/27/21 0500 11/27/21 0841  BP: (!) 151/67 123/78 127/89 124/72  Pulse: 78 96 89   Resp:  17 19   Temp: 98.2 F (36.8 C) 97.9 F (36.6 C) 98.3 F (36.8 C)   TempSrc: Oral Oral Oral   SpO2:  98% 97%   Weight: 53.7 kg     Height:        Intake/Output Summary (Last 24 hours) at 11/27/2021 1254 Last data filed  at 11/27/2021 0920 Gross per 24 hour  Intake 183 ml  Output 2000 ml  Net -1817 ml   Filed Weights   11/24/21 0840 11/25/21 0500 11/26/21 0720  Weight: 61.2 kg 60.9 kg 53.7 kg    Physical Examination:  General:  Average built, not in obvious distress, elderly female HENT:   Mild pallor noted.  Oral mucosa is moist.  Chest:  Clear breath sounds.   Diminished breath sounds bilaterally. No crackles or wheezes.  CVS: S1 &S2 heard. No murmur.  Regular rate and rhythm. Abdomen: Soft, nontender, nondistended.  Bowel sounds are heard.   Extremities: No cyanosis, clubbing or edema.  Peripheral pulses are palpable. Psych: Alert, awake and communicative normal mood CNS:  No cranial nerve deficits.  Power equal in all extremities.   Skin: Warm and dry.  No rashes noted.  Data Reviewed:   CBC: Recent Labs  Lab 11/24/21 0917 11/24/21 1234 11/25/21 0506 11/25/21 1732 11/26/21 0432 11/26/21 1711 11/27/21 0439  WBC 7.6  --  5.5  --  6.2  --   --   HGB 10.3*   < > 8.8* 9.2* 9.8* 8.8* 8.9*  HCT 32.4*   < > 27.9* 29.0* 30.2* 27.0* 26.7*  MCV 102.9*  --  104.1*  --  101.3*  --   --   PLT 394  --  338  --  377  --   --    < > = values in this interval not displayed.    Basic Metabolic Panel: Recent Labs  Lab 11/24/21 0917 11/25/21 0506 11/26/21 0432 11/27/21 0439  NA 134* 135 136 135  K 4.2 3.7 3.2* 4.4  CL 97* 103 102 104  CO2 30 27 27 24   GLUCOSE 116* 102* 105* 98  BUN 14 8 <5* 9  CREATININE 0.50 0.47 0.44 0.67  CALCIUM 8.5* 8.3* 8.5* 8.4*  MG  --   --  1.9  --     Liver Function Tests: Recent Labs  Lab 11/24/21 0917  AST 17  ALT 12  ALKPHOS 96  BILITOT 0.4  PROT 6.3*  ALBUMIN 3.2*     Radiology Studies: No results found.    LOS: 3 days    Flora Lipps, MD [facility] 11/27/2021, 12:54 PM

## 2021-11-27 NOTE — Progress Notes (Addendum)
Urology Consult   Physician requesting consult: Dr Louanne Belton  Reason for consult: Urinary difficulties  History of Present Illness: Susan Henry is a 88 y.o. female admitted for management of a GI bleed.  She has had history of this before.  She does not have significant urologic history.  During the hospitalization, she complained of lower abdominal discomfort and was felt to be in urinary retention.  She has not had a catheter placed and residual urine volumes have been up to 500 mL.  The patient has not voided today but she has not had an IV in place for over a day and she has not had significant p.o. intake.  Currently, she does complain of lower abdominal discomfort.  According to the patient's daughters, both of whom I spoke to, she has had urinary tract infections fairly recently, but has not had any prior urologic issues such as incontinence or prior history of retention.  CT scan recently showed a distended bladder, no significant fecal impaction and normal renal findings.  She does have mild dementia.  She is on 24-hour care.  She spends most of her time in bed.   Past Medical History:  Diagnosis Date   Allergy    Seasonal   Anemia    Iron deficiency    Anxiety    Aortic sclerosis    Arthritis    Blood transfusion    Colon polyps    Hx of  adenomatous   Depression    Diverticulosis    GERD (gastroesophageal reflux disease)    Hemorrhoids, internal, with bleeding 01/08/2011   Hypertension    Insomnia    Pneumonia    Reflux     Past Surgical History:  Procedure Laterality Date   CHOLECYSTECTOMY     COLON SURGERY     COLONOSCOPY  01/2011   COLONOSCOPY  11/18/2011   Procedure: COLONOSCOPY;  Surgeon: Owens Loffler, MD;  Location: [facility];  Service: Endoscopy;  Laterality: N/A;   COLONOSCOPY  11/24/2011   Procedure: COLONOSCOPY;  Surgeon: Zenovia Jarred, MD;  Location: [facility];  Service: Gastroenterology;  Laterality: N/A;   ESOPHAGOGASTRODUODENOSCOPY   11/18/2011   Procedure: ESOPHAGOGASTRODUODENOSCOPY (EGD);  Surgeon: Owens Loffler, MD;  Location: [facility];  Service: Endoscopy;  Laterality: N/A;   LOW ANTERIOR BOWEL RESECTION  1996   diverticulitis   UPPER GASTROINTESTINAL ENDOSCOPY       Current Hospital Medications: Scheduled Meds:  amLODipine  5 mg Oral Daily   citalopram  20 mg Oral Daily   donepezil  10 mg Oral QHS   pantoprazole (PROTONIX) IV  40 mg Intravenous Q12H   sodium chloride flush  3 mL Intravenous Q12H   Continuous Infusions:  sodium chloride     PRN Meds:.sodium chloride, acetaminophen **OR** acetaminophen, sodium chloride flush  Allergies:  Allergies  Allergen Reactions   Alprazolam Rash    Family History  Problem Relation Age of Onset   Pancreatic cancer Father    Colon cancer Neg Hx    Anesthesia problems Neg Hx    Hypotension Neg Hx    Malignant hyperthermia Neg Hx    Pseudochol deficiency Neg Hx     Social History:  reports that she has never smoked. She has never used smokeless tobacco. She reports current alcohol use. She reports that she does not use drugs.  ROS: A complete review of systems was performed.  All systems are negative except for pertinent findings as noted.  Physical Exam:  Vital signs in last 24  hours: Temp:  [98.3 F (36.8 C)] 98.3 F (36.8 C) (02/10 0500) Pulse Rate:  [89] 89 (02/10 0500) Resp:  [19] 19 (02/10 0500) BP: (124-127)/(72-89) 124/72 (02/10 0841) SpO2:  [97 %] 97 % (02/10 0500) General:  Alert and oriented, No acute distress HEENT: Normocephalic, atraumatic Neck: No JVD or lymphadenopathy Cardiovascular: Regular rate  Lungs: Normal inspiratory/expiratory excursion Abdomen: Soft, minimal suprapubic tenderness. Extremities: No edema Neurologic: Grossly intact  Laboratory Data:  Recent Labs    11/25/21 0506 11/25/21 1732 11/26/21 0432 11/26/21 1711 11/27/21 0439  WBC 5.5  --  6.2  --   --   HGB 8.8* 9.2* 9.8* 8.8* 8.9*  HCT 27.9* 29.0* 30.2*  27.0* 26.7*  PLT 338  --  377  --   --     Recent Labs    11/25/21 0506 11/26/21 0432 11/27/21 0439  NA 135 136 135  K 3.7 3.2* 4.4  CL 103 102 104  GLUCOSE 102* 105* 98  BUN 8 <5* 9  CALCIUM 8.3* 8.5* 8.4*  CREATININE 0.47 0.44 0.67     Results for orders placed or performed during the hospital encounter of 11/24/21 (from the past 24 hour(s))  Hemoglobin and hematocrit, blood     Status: Abnormal   Collection Time: 11/26/21  5:11 PM  Result Value Ref Range   Hemoglobin 8.8 (L) 12.0 - 15.0 g/dL   HCT 27.0 (L) 36.0 - 46.0 %  Hemoglobin and hematocrit, blood     Status: Abnormal   Collection Time: 11/27/21  4:39 AM  Result Value Ref Range   Hemoglobin 8.9 (L) 12.0 - 15.0 g/dL   HCT 26.7 (L) 36.0 - [id number] %  Basic metabolic panel     Status: Abnormal   Collection Time: 11/27/21  4:39 AM  Result Value Ref Range   Sodium 135 135 - 145 mmol/L   Potassium 4.4 3.5 - 5.1 mmol/L   Chloride 104 98 - 111 mmol/L   CO2 24 22 - 32 mmol/L   Glucose, Bld 98 70 - 99 mg/dL   BUN 9 8 - 23 mg/dL   Creatinine, Ser 0.67 0.44 - 1.00 mg/dL   Calcium 8.4 (L) 8.9 - 10.3 mg/dL   GFR, Estimated >60 >60 mL/min   Anion gap 7 5 - 15   Recent Results (from the past 240 hour(s))  Resp Panel by RT-PCR (Flu A&B, Covid) Nasopharyngeal Swab     Status: Abnormal   Collection Time: 11/24/21  9:17 AM   Specimen: Nasopharyngeal Swab; Nasopharyngeal(NP) swabs in vial transport medium  Result Value Ref Range Status   SARS Coronavirus 2 by RT PCR POSITIVE (A) NEGATIVE Final    Comment: (NOTE) SARS-CoV-2 target nucleic acids are DETECTED.  The SARS-CoV-2 RNA is generally detectable in upper respiratory specimens during the acute phase of infection. Positive results are indicative of the presence of the identified virus, but do not rule out bacterial infection or co-infection with other pathogens not detected by the test. Clinical correlation with patient history and other diagnostic information is  necessary to determine patient infection status. The expected result is Negative.  Fact Sheet for Patients: EntrepreneurPulse.com.au  Fact Sheet for Healthcare Providers: IncredibleEmployment.be  This test is not yet approved or cleared by the [place] FDA and  has been authorized for detection and/or diagnosis of SARS-CoV-2 by FDA under an Emergency Use Authorization (EUA).  This EUA will remain in effect (meaning this test can be used) for the duration of  the COVID-19 declaration  under Section 564(b)(1) of the A ct, 21 U.S.C. section 360bbb-3(b)(1), unless the authorization is terminated or revoked sooner.     Influenza A by PCR NEGATIVE NEGATIVE Final   Influenza B by PCR NEGATIVE NEGATIVE Final    Comment: (NOTE) The Xpert Xpress SARS-CoV-2/FLU/RSV plus assay is intended as an aid in the diagnosis of influenza from Nasopharyngeal swab specimens and should not be used as a sole basis for treatment. Nasal washings and aspirates are unacceptable for Xpert Xpress SARS-CoV-2/FLU/RSV testing.  Fact Sheet for Patients: EntrepreneurPulse.com.au  Fact Sheet for Healthcare Providers: IncredibleEmployment.be  This test is not yet approved or cleared by the [place] FDA and has been authorized for detection and/or diagnosis of SARS-CoV-2 by FDA under an Emergency Use Authorization (EUA). This EUA will remain in effect (meaning this test can be used) for the duration of the COVID-19 declaration under Section 564(b)(1) of the Act, 21 U.S.C. section 360bbb-3(b)(1), unless the authorization is terminated or revoked.  Performed at [facility], [place] [address], [place], [place] [address]     Renal Function: Recent Labs    11/24/21 [id number] 11/25/21 0506 11/26/21 0432 11/27/21 0439  CREATININE 0.50 0.47 0.44 0.67   Estimated Creatinine Clearance: 36.5 mL/min (by C-G formula based on  SCr of 0.67 mg/dL).  Radiologic Imaging: No results found.  I independently reviewed the above imaging studies.  She does not have any prior abdominal i imaging. Impression/Assessment:  Poorly emptying bladder.  This may be a chronic issue in this 88 year old woman with mild dementia and weakness.  However, it may be more acute.  At this point, I think it worthwhile just to leave the Foley catheter in for some time and bring her back to the office once she gets a little bit stronger for voiding trial.  I do not necessarily think she needs to be on any pharmacologic management for this  Plan:  I have spoken with the nursing staff here-we will try to place a 14 French coud tip catheter.  If that is difficult/impossible, I will come by this evening to give it a try  I think it worthwhile to let her go home with a catheter and bring her back in a couple of weeks in our office for voiding trial

## 2021-11-27 NOTE — Evaluation (Signed)
Physical Therapy Evaluation Patient Details Name: Susan Henry MRN: [id number] DOB: [birth year] Today's Date: 11/27/2021  History of Present Illness  Patient is 88 y.o. female presented to ED with c/o maroon stools, weakness, and fatigue. CT angiogram GI bleed protocol without any active signs of bleeding.  Hemoglobin has remained stable. Patient also developed urinary retention and required in and out catheterization. She has been having urinary trouble for the last month. PMH significant for iron deficiency anemia, history of colonic polyps, depression, diverticulosis, anxiety, depression, dementia GERD, history of hemorrhoids, HTN. Patient does have history of recurrent lower GI bleed in January 2013 and had undergone colonoscopies at that time.  She was noted to have severe diverticulosis in the ascending to sigmoid colon. Patient has history of mesenteric angiogram done in the past with embolization of ascending colon of SMA in 2013.   Clinical Impression  Susan Henry is 88 y.o. female admitted with above HPI and diagnosis. Patient is currently limited by functional impairments below (see PT problem list). Patient lives at her home and has 24/7 assist from caregivers and family. She requires min assist for ADL's and functional transfers and household ambulation with RW at baseline. Today pt required min assist to ambulate ~60' with RW. Patient will benefit from continued skilled PT interventions to address impairments and progress independence with mobility, recommending HHPT. Acute PT will follow and progress as able.        Recommendations for follow up therapy are one component of a multi-disciplinary discharge planning process, led by the attending physician.  Recommendations may be updated based on patient status, additional functional criteria and insurance authorization.  Follow Up Recommendations Home health PT (pt has been using [facility])    Assistance Recommended at Discharge  Frequent or constant Supervision/Assistance  Patient can return home with the following  A little help with walking and/or transfers;A little help with bathing/dressing/bathroom;Assistance with cooking/housework;Direct supervision/assist for medications management;Assist for transportation;Help with stairs or ramp for entrance    Equipment Recommendations None recommended by PT  Recommendations for Other Services       Functional Status Assessment Patient has had a recent decline in their functional status and demonstrates the ability to make significant improvements in function in a reasonable and predictable amount of time.     Precautions / Restrictions Precautions Precautions: Fall Restrictions Weight Bearing Restrictions: No      Mobility  Bed Mobility Overal bed mobility: Needs Assistance Bed Mobility: Supine to Sit, Sit to Supine     Supine to sit: Min assist, HOB elevated Sit to supine: Min assist   General bed mobility comments: light assist to bring LE's off/on EOB and to raise trunk.    Transfers Overall transfer level: Needs assistance Equipment used: Rolling walker (2 wheels) Transfers: Sit to/from Stand Sit to Stand: Min assist           General transfer comment: min assist to steady with rise from EOB, pt with bil UE's on RW.    Ambulation/Gait Ambulation/Gait assistance: Min assist Gait Distance (Feet): 60 Feet Assistive device: Rolling walker (2 wheels) Gait Pattern/deviations: Step-to pattern, Decreased step length - right, Decreased step length - left, Decreased stride length, Trunk flexed, Shuffle, Knee flexed in stance - left, Decreased weight shift to left, Knee flexed in stance - right Gait velocity: decr     General Gait Details: Pt with Rt lean during gait and Lt knee flexed in stance. Posture became more crouched as pt fatigued.  VSS with  HR in 110's.  Stairs            Wheelchair Mobility    Modified Rankin (Stroke Patients  Only)       Balance Overall balance assessment: Needs assistance Sitting-balance support: Feet supported, Bilateral upper extremity supported Sitting balance-Leahy Scale: Fair   Postural control: Right lateral lean Standing balance support: Reliant on assistive device for balance, During functional activity, Bilateral upper extremity supported Standing balance-Leahy Scale: Poor                               Pertinent Vitals/Pain Pain Assessment Pain Assessment: Faces Faces Pain Scale: No hurt Pain Intervention(s): Monitored during session    Home Living Family/patient expects to be discharged to:: Private residence Living Arrangements: Other (Comment) Available Help at Discharge: Available 24 hours/day Type of Home: House Home Access: Stairs to enter Entrance Stairs-Rails: Lawyer of Steps: 2   Home Layout: One level Home Equipment: Agricultural consultant (2 wheels);BSC/3in1;Shower seat;Grab bars - tub/shower;Hand held shower head Additional Comments: pt has 24/7 personal caregivers and her daughter helps as well. pt was recieving HHPT PTA.    Prior Function Prior Level of Function : Needs assist       Physical Assist : Mobility (physical);ADLs (physical) Mobility (physical): Gait;Bed mobility;Transfers;Stairs ADLs (physical): Grooming;Bathing;Dressing;Toileting;IADLs Mobility Comments: uses RW and aids help her ADLs Comments: home aids help with bathing/dressing; provide/cook meals     Hand Dominance   Dominant Hand: Right    Extremity/Trunk Assessment   Upper Extremity Assessment Upper Extremity Assessment: Generalized weakness    Lower Extremity Assessment Lower Extremity Assessment: Generalized weakness    Cervical / Trunk Assessment Cervical / Trunk Assessment: Kyphotic  Communication   Communication: No difficulties  Cognition Arousal/Alertness: Awake/alert Behavior During Therapy: WFL for tasks  assessed/performed Overall Cognitive Status: Within Functional Limits for tasks assessed                                          General Comments      Exercises     Assessment/Plan    PT Assessment Patient needs continued PT services  PT Problem List Decreased strength;Decreased range of motion;Decreased balance;Decreased activity tolerance;Decreased mobility;Decreased knowledge of use of DME;Decreased knowledge of precautions;Decreased safety awareness       PT Treatment Interventions DME instruction;Gait training;Stair training;Therapeutic activities;Balance training;Therapeutic exercise;Functional mobility training;Neuromuscular re-education;Patient/family education    PT Goals (Current goals can be found in the Care Plan section)  Acute Rehab PT Goals Patient Stated Goal: get home PT Goal Formulation: With patient/family Time For Goal Achievement: 12/11/21 Potential to Achieve Goals: Good    Frequency Min 3X/week     Co-evaluation               AM-PAC PT "6 Clicks" Mobility  Outcome Measure Help needed turning from your back to your side while in a flat bed without using bedrails?: A Little Help needed moving from lying on your back to sitting on the side of a flat bed without using bedrails?: A Little Help needed moving to and from a bed to a chair (including a wheelchair)?: A Little Help needed standing up from a chair using your arms (e.g., wheelchair or bedside chair)?: A Little Help needed to walk in hospital room?: A Little Help needed climbing 3-5 steps with a railing? :  A Lot 6 Click Score: 17    End of Session Equipment Utilized During Treatment: Gait belt Activity Tolerance: Patient tolerated treatment well Patient left: in bed;with call bell/phone within reach;with bed alarm set;with family/visitor present Nurse Communication: Mobility status PT Visit Diagnosis: Muscle weakness (generalized) (M62.81);Unsteadiness on feet  (R26.81);Difficulty in walking, not elsewhere classified (R26.2)    Time: 2703-5009 PT Time Calculation (min) (ACUTE ONLY): 22 min   Charges:   PT Evaluation $PT Eval Low Complexity: 1 Low          Wynn Maudlin, DPT Acute Rehabilitation Services Office [phone] Pager [phone]   Anitra Lauth 11/27/2021, 3:36 PM

## 2021-11-28 DIAGNOSIS — E876 Hypokalemia: Secondary | ICD-10-CM

## 2021-11-28 LAB — CBC
HCT: 26.9 % — ABNORMAL LOW (ref 36.0–46.0)
Hemoglobin: 8.6 g/dL — ABNORMAL LOW (ref 12.0–15.0)
MCH: 33.1 pg (ref 26.0–34.0)
MCHC: 32 g/dL (ref 30.0–36.0)
MCV: 103.5 fL — ABNORMAL HIGH (ref 80.0–100.0)
Platelets: 340 10*3/uL (ref 150–400)
RBC: 2.6 MIL/uL — ABNORMAL LOW (ref 3.87–5.11)
RDW: 13.2 % (ref 11.5–15.5)
WBC: 6.7 10*3/uL (ref 4.0–10.5)
nRBC: 0 % (ref 0.0–0.2)

## 2021-11-28 LAB — BASIC METABOLIC PANEL
Anion gap: 5 (ref 5–15)
BUN: 10 mg/dL (ref 8–23)
CO2: 29 mmol/L (ref 22–32)
Calcium: 8.3 mg/dL — ABNORMAL LOW (ref 8.9–10.3)
Chloride: 101 mmol/L (ref 98–111)
Creatinine, Ser: 0.59 mg/dL (ref 0.44–1.00)
GFR, Estimated: >60 mL/min (ref >60)
Glucose, Bld: 104 mg/dL — ABNORMAL HIGH (ref 70–99)
Potassium: 3.7 mmol/L (ref 3.5–5.1)
Sodium: 135 mmol/L (ref 135–145)

## 2021-11-28 LAB — MAGNESIUM: Magnesium: 1.8 mg/dL (ref 1.7–2.4)

## 2021-11-28 MED ORDER — POLYETHYLENE GLYCOL 3350 17 G PO PACK
17.0000 g | PACK | Freq: Every day | ORAL | Status: DC
  Filled 2021-11-28: qty 1

## 2021-11-28 MED ORDER — PANTOPRAZOLE SODIUM 40 MG PO TBEC: 40.0000 mg | DELAYED_RELEASE_TABLET | Freq: Every day | ORAL | 0 refills | Status: AC

## 2021-11-28 MED ORDER — POLYETHYLENE GLYCOL 3350 17 G PO PACK
17.0000 g | PACK | Freq: Every day | ORAL | 0 refills | Status: AC | PRN
Start: 2021-11-28 — End: ?

## 2021-11-28 NOTE — Progress Notes (Signed)
Subjective: Patient not appreciated with catheter.  She has been pulling on it some.  Objective: Vital signs in last 24 hours: Temp:  [97.6 F (36.4 C)-98.7 F (37.1 C)] 97.6 F (36.4 C) (02/11 0511) Pulse Rate:  [62-74] 62 (02/11 0511) Resp:  [16-18] 16 (02/11 0511) BP: (117-132)/(52-72) 117/52 (02/11 0511) SpO2:  [92 %-95 %] 95 % (02/11 0511) Weight:  [57 kg] 57 kg (02/11 0511)  Intake/Output from previous day: 02/10 0701 - 02/11 0700 In: 603 [P.O.:600; I.V.:3] Out: 825 [Urine:825] Intake/Output this shift: No intake/output data recorded.   Lab Results: Recent Labs    11/26/21 1711 11/27/21 0439 11/28/21 0457  HGB 8.8* 8.9* 8.6*  HCT 27.0* 26.7* 26.9*   BMET Recent Labs    11/27/21 0439 11/28/21 0457  NA 135 135  K 4.4 3.7  CL 104 101  CO2 24 29  GLUCOSE 98 104*  BUN 9 10  CREATININE 0.67 0.59  CALCIUM 8.4* 8.3*   No results for input(s): LABPT, INR in the last 72 hours. No results for input(s): LABURIN in the last 72 hours. Results for orders placed or performed during the hospital encounter of 11/24/21  Resp Panel by RT-PCR (Flu A&B, Covid) Nasopharyngeal Swab     Status: Abnormal   Collection Time: 11/24/21  9:17 AM   Specimen: Nasopharyngeal Swab; Nasopharyngeal(NP) swabs in vial transport medium  Result Value Ref Range Status   SARS Coronavirus 2 by RT PCR POSITIVE (A) NEGATIVE Final    Comment: (NOTE) SARS-CoV-2 target nucleic acids are DETECTED.  The SARS-CoV-2 RNA is generally detectable in upper respiratory specimens during the acute phase of infection. Positive results are indicative of the presence of the identified virus, but do not rule out bacterial infection or co-infection with other pathogens not detected by the test. Clinical correlation with patient history and other diagnostic information is necessary to determine patient infection status. The expected result is Negative.  Fact Sheet for  Patients: BloggerCourse.com  Fact Sheet for Healthcare Providers: SeriousBroker.it  This test is not yet approved or cleared by the [place] FDA and  has been authorized for detection and/or diagnosis of SARS-CoV-2 by FDA under an Emergency Use Authorization (EUA).  This EUA will remain in effect (meaning this test can be used) for the duration of  the COVID-19 declaration under Section 564(b)(1) of the A ct, 21 U.S.C. section 360bbb-3(b)(1), unless the authorization is terminated or revoked sooner.     Influenza A by PCR NEGATIVE NEGATIVE Final   Influenza B by PCR NEGATIVE NEGATIVE Final    Comment: (NOTE) The Xpert Xpress SARS-CoV-2/FLU/RSV plus assay is intended as an aid in the diagnosis of influenza from Nasopharyngeal swab specimens and should not be used as a sole basis for treatment. Nasal washings and aspirates are unacceptable for Xpert Xpress SARS-CoV-2/FLU/RSV testing.  Fact Sheet for Patients: BloggerCourse.com  Fact Sheet for Healthcare Providers: SeriousBroker.it  This test is not yet approved or cleared by the [place] FDA and has been authorized for detection and/or diagnosis of SARS-CoV-2 by FDA under an Emergency Use Authorization (EUA). This EUA will remain in effect (meaning this test can be used) for the duration of the COVID-19 declaration under Section 564(b)(1) of the Act, 21 U.S.C. section 360bbb-3(b)(1), unless the authorization is terminated or revoked.  Performed at [facility], [address], [place], [place] [address]     Studies/Results: No results found.  Assessment/Plan:  Urinary retention/incomplete emptying, unknown etiology.  This may be longstanding  in her  I discussed management with her daughter-she should have a couple of weeks at home with that Foley catheter and we will have her call for an  appointment to schedule a trial of voiding  I will have nursing staff add 10 cc of water to the balloon to prevent removal by the patient.   LOS: 4 days   Chelsea Aus 11/28/2021, 8:11 AM

## 2021-11-28 NOTE — Progress Notes (Signed)
Per MD order, 10cc added to Urinary Cath balloon, patient tolerated well.  Teach back method to daughers about catheter care.  Waiting for arrival of another caregiver to give another teaching of catheter care.

## 2021-11-28 NOTE — TOC Progression Note (Signed)
Transition of Care [facility]) - Progression Note    Patient Details  Name: Susan Henry MRN: [id number] Date of Birth: 12-23-1924  Transition of Care [facility]) CM/SW Contact  Geni Bers, RN Phone Number: 11/28/2021, 10:11 AM  Clinical Narrative:     Spoke with pt's daughter Rise concerning HH. She selected [facility].  Pt will discharge home with [facility] for [facility]. Referral given to [facility]   Expected Discharge Plan: Home w Home Health Services Barriers to Discharge: No Barriers Identified  Expected Discharge Plan and Services Expected Discharge Plan: Home w Home Health Services     Post Acute Care Choice: Home Health Living arrangements for the past 2 months: Single Family Home Expected Discharge Date: 11/28/21                         HH Arranged: RN, PT, NA HH Agency: [facility] Date [facility] Agency Contacted: 11/28/21 Time HH Agency Contacted: [phone] Representative spoke with at [facility] Agency: Kandee Keen   Social Determinants of Health (SDOH) Interventions    Readmission Risk Interventions No flowsheet data found.

## 2021-11-28 NOTE — Discharge Summary (Signed)
Physician Discharge Summary   Patient: Susan Henry MRN: [id number] DOB: 1925-05-12  Admit date:     11/24/2021  Discharge date: 11/28/21  Discharge Physician: Flora Lipps   PCP: Glenis Smoker, MD   Recommendations at discharge:   Follow-up with primary care provider in 1 week. Follow-up with urology in 2 weeks for Foley catheter management/voiding trial.  Discharge Diagnoses: Principal Problem:   Acute GI bleeding Active Problems:   Anemia due to acute blood loss   Acute urinary retention   GERD (gastroesophageal reflux disease)   Hypertension   Dementia (HCC)   Diverticulosis of colon with hemorrhage   Hematochezia   Hypokalemia  Resolved Problems:   * No resolved hospital problems. *   Hospital Course: ARALI OREGEL is a 88 y.o. female with medical history significant of iron deficiency anemia, history of colonic polyps, depression, diverticulosis, anxiety, depression, dementia GERD, history of hemorrhoids, hypertension presented to the hospital with complaints of dark maroon stools with weakness and fatigue for 1 day.    In the ED, vitals were stable.  Hemoglobin was 10.3 compared to hemoglobin of 13.0 on  10/22/2021.  Platelets were adequate.   Patient does have history of recurrent lower GI bleed in January 2013 and had undergone colonoscopies at that time.  She was noted to have severe diverticulosis in the ascending to sigmoid colon..  Patient has history of mesenteric angiogram done in the past with embolization of ascending colon of SMA in 2013.  During hospitalization, GI was consulted.  Due to ongoing rectal bleeding, CTA abdomen and pelvis was done which was negative for acute extravasation.  Patient also developed urinary retention and required in and out catheterization.  She had significant urinary retention.  Family concerned about urological issues and urology was consulted during hospitalization.  Assessment and Plan:  * Acute GI bleeding-  (present on admission) Patient did have a colonoscopy back in 2013 and notable for diverticular disease.  GI on board.  Possibility of diverticular bleed.  CT angiogram GI bleed protocol without any active signs of bleeding.  Hemoglobin has remained stable.  Latest hemoglobin of 8.6 from 8.9<8.8.  Changed to oral Protonix.  Has been changed to soft diet.  GI bleed has resolved.  Acute urinary retention Patient required in and out catheterization with significant urinary retention.  Urology was consulted due to concerns for urinary retention.  Patient did have difficulty catheterization and difficult anatomy but subsequently Foley catheter was placed. Urology has plans to follow-up in 2 weeks as outpatient with a Foley catheter for voiding trial.  Anemia due to acute blood loss- (present on admission) FOBT positive.  Stable hemoglobin.  No further bleed.  Did not require any blood transfusion during hospitalization.  GERD (gastroesophageal reflux disease) Continue Protonix on discharge  Hypertension- (present on admission) Patient is on amlodipine, and lisinopril at home.   Hypokalemia Improved after supplementation.  Potassium of 3.7 today.  Dementia ([place]) Continue Aricept   Consultants:  GI  urology  Procedures performed: Foley catheter placement Disposition: Home health.  Spoke with the patient's daughters at bedside prior to disposition.  Diet recommendation:  Discharge Diet Orders (From admission, onward)     Start     Ordered   11/28/21 0000  Diet general        11/28/21 0910           Regular diet  DISCHARGE MEDICATION: Allergies as of 11/28/2021       Reactions   Alprazolam  Rash        Medication List     STOP taking these medications    bisoprolol 5 MG tablet Commonly known as: ZEBETA   nitrofurantoin (macrocrystal-monohydrate) 100 MG capsule Commonly known as: MACROBID       TAKE these medications    amLODipine 5 MG tablet Commonly known  as: NORVASC Take 5 mg by mouth every morning. What changed: Another medication with the same name was removed. Continue taking this medication, and follow the directions you see here.   citalopram 20 MG tablet Commonly known as: CELEXA Take 20 mg by mouth every morning.   donepezil 10 MG tablet Commonly known as: ARICEPT Take 1 tablet (10 mg total) by mouth at bedtime.   lisinopril 20 MG tablet Commonly known as: ZESTRIL Take 20 mg by mouth every morning.   naproxen sodium 220 MG tablet Commonly known as: ALEVE Take 220 mg by mouth 2 (two) times daily as needed (back pain).   ondansetron 4 MG tablet Commonly known as: ZOFRAN Take 1 tablet (4 mg total) by mouth every 6 (six) hours.   pantoprazole 40 MG tablet Commonly known as: PROTONIX Take 1 tablet (40 mg total) by mouth daily.   polyethylene glycol 17 g packet Commonly known as: MIRALAX / GLYCOLAX Take 17 g by mouth daily as needed for mild constipation or moderate constipation.   PreserVision AREDS 2 Caps Take 2 capsules by mouth every morning.   Vitamin D (Ergocalciferol) 1.25 MG (50000 UNIT) Caps capsule Commonly known as: DRISDOL Take 50,000 Units by mouth every Thursday.        Follow-up Information     [facility] Follow up.   Why: Call for an appointment to set up trial of voiding/catheter removal to be done in about 2 weeks Contact information: [place] [phone]               Subjective: Today, patient was seen and examined at bedside.  No interval complaints reported.  Status post Foley catheter.  Has not had a bowel movement in 2 days.  Discharge Exam: Filed Weights   11/25/21 0500 11/26/21 0720 11/28/21 0511  Weight: 60.9 kg 53.7 kg 57 kg   Vitals with BMI 11/28/2021 11/27/2021 11/27/2021  Height - - -  Weight 125 lbs 11 oz - -  BMI Q000111Q - -  Systolic [id number] [id number] Q000111Q  Diastolic 52 55 53  Pulse 62 74 73    General:  Average  built, not in obvious distress HENT:   Mild pallor noted.  Oral mucosa is moist.  Chest:  Clear breath sounds.  Diminished breath sounds bilaterally. No crackles or wheezes.  CVS: S1 &S2 heard. No murmur.  Regular rate and rhythm. Abdomen: Soft, nontender, nondistended.  Bowel sounds are heard.   Extremities: No cyanosis, clubbing or edema.  Peripheral pulses are palpable. Psych: Alert, awake and oriented, normal mood CNS:  No cranial nerve deficits.  Power equal in all extremities.   Skin: Warm and dry.  No rashes noted.   Condition at discharge: good  The results of significant diagnostics from this hospitalization (including imaging, microbiology, ancillary and laboratory) are listed below for reference.   Imaging Studies: DG Chest Port 1 View  Result Date: 11/24/2021 CLINICAL DATA:  Fatigue.  Rectal bleeding. EXAM: PORTABLE CHEST 1 VIEW COMPARISON:  One-view chest x-ray 10/22/2021 FINDINGS: Heart is enlarged. Atherosclerotic calcifications are present at the aortic arch. Changes of COPD  are noted. No edema or effusion is present. Moderate hiatal hernia is present. Density noted at the superior mediastinum. IMPRESSION: 1. Density in widening of the superior mediastinum. 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 Other mass lesion not excluded. If clinically indicated, CT of the chest with contrast could be used for further evaluation. 2. No acute cardiopulmonary disease. 3. Moderate hiatal hernia. 4. Cardiomegaly without failure. 5. Aortic atherosclerosis. Electronically Signed   By: San Morelle M.D.   On: 11/24/2021 10:32   CT ANGIO GI BLEED  Result Date: 11/25/2021 CLINICAL DATA:  Dark maroon stools with weakness and fatigue for 1 day, hemoglobin 10.3 today, was 13.0 on 10/22/2021, adequate platelets. History of hypertension, colonic diverticulosis, GERD EXAM: CTA ABDOMEN AND PELVIS WITHOUT AND WITH CONTRAST TECHNIQUE: Multidetector CT imaging of the abdomen and pelvis was performed  using the standard protocol during bolus administration of intravenous contrast. Multiplanar reconstructed images and MIPs were obtained and reviewed to evaluate the vascular anatomy. RADIATION DOSE REDUCTION: This exam was performed according to the departmental dose-optimization program which includes automated exposure control, adjustment of the mA and/or kV according to patient size and/or use of iterative reconstruction technique. CONTRAST:  144mL OMNIPAQUE IOHEXOL 350 MG/ML SOLN IV COMPARISON:  None FINDINGS: VASCULAR Aorta: Atherosclerotic calcifications aorta without aneurysm or dissection. Celiac: Widely patent SMA: Calcified plaque at proximal SMA without significant narrowing Renals: Plaque at the origins and proximal portions of the renal arteries bilaterally greater on LEFT. Less than 50% narrowing at RIGHT renal artery. Greater than 50% narrowing LEFT renal artery. IMA: Patent Inflow: Scattered calcified plaques within the iliac arteries bilaterally. Iliac arteries normal caliber. Proximal Outflow: Plaques at common femoral arteries and femoral bifurcations bilaterally without significant narrowing. Veins: Venous structures patent. Review of the MIP images confirms the above findings. NON-VASCULAR Lower chest: 2 mm LEFT lower lobe nodule image 27. Subsegmental atelectasis BILATERAL lower lobes greater on RIGHT. Hepatobiliary: Gallbladder surgically absent. No hepatic mass or nodularity. Six low insertion of cystic duct stump. Dilated CBD 13 mm diameter with mild intrahepatic biliary dilatation. Rounded filling defect distal CBD 6 mm diameter consistent with choledocholithiasis. Pancreas: Minimal pancreatic ductal dilatation.  No pancreatic mass. Spleen: Few calcified granulomata within spleen.  No mass lesion. Adrenals/Urinary Tract: Cortical thinning of both kidneys. Tiny BILATERAL renal cysts. Additional nonspecific intermediate attenuation nodule inferior pole LEFT kidney 11 x 9 mm image 36. Well  distended bladder. Unremarkable ureters. No urinary tract calcification. Stomach/Bowel: Stomach decompressed with moderate to large hiatal hernia noted. Duodenal bulb and sweep normal appearance. Small bowel loops assessment limited by respiratory motion artifacts. No gross abnormality, dilatation or wall thickening seen. Diffuse colonic diverticulosis. Wall thickening of colon from hepatic flexure through mid descending colon consistent with colitis. Additional wall thickening at proximal sigmoid colon no definite active GI contrast extravasation seen to suggest active GI bleeding, though multiple colonic diverticula contain high attenuation material likely representing inspissated secretions and or prior contrast. Sigmoid anastomosis identified. Normal appendix. Lymphatic: Few scattered normal sized retroperitoneal lymph nodes. No abdominal or pelvic adenopathy. Reproductive: Unremarkable uterus and adnexa Other: No free air or free fluid.  No hernia. Musculoskeletal: Degenerative disc and facet disease changes lower lumbar spine. Marked compression fracture of T11 vertebral body, age indeterminate but new since 01/10/2018. IMPRESSION: Greater than 50% narrowing of the LEFT renal artery. Scattered atherosclerotic calcifications aorta, iliac arteries, femoral arteries. Less than 50% narrowing of RIGHT renal artery and proximal SMA. No definite active GI bleeding identified. Diffuse colonic diverticulosis without evidence  of acute diverticulitis. Wall thickening of the colon from hepatic flexure through mid descending colon consistent with colitis. Additional wall thickening of proximal sigmoid colon, may represent colitis as well but mass lesion is not excluded; follow-up colonoscopy recommended. Intrahepatic and extrahepatic biliary dilatation with a 6 mm diameter distal CBD calculus consistent with choledocholithiasis. Additional nonspecific intermediate attenuation nodule inferior pole LEFT kidney 11 x 9 mm;  recommend ultrasound characterization. Marked compression fracture of T11 vertebral body, age indeterminate but new since 01/10/2018. Moderate to large hiatal hernia. Aortic Atherosclerosis (ICD10-I70.0). Electronically Signed   By: Lavonia Dana M.D.   On: 11/25/2021 12:08    Microbiology: Results for orders placed or performed during the hospital encounter of 11/24/21  Resp Panel by RT-PCR (Flu A&B, Covid) Nasopharyngeal Swab     Status: Abnormal   Collection Time: 11/24/21  9:17 AM   Specimen: Nasopharyngeal Swab; Nasopharyngeal(NP) swabs in vial transport medium  Result Value Ref Range Status   SARS Coronavirus 2 by RT PCR POSITIVE (A) NEGATIVE Final    Comment: (NOTE) SARS-CoV-2 target nucleic acids are DETECTED.  The SARS-CoV-2 RNA is generally detectable in upper respiratory specimens during the acute phase of infection. Positive results are indicative of the presence of the identified virus, but do not rule out bacterial infection or co-infection with other pathogens not detected by the test. Clinical correlation with patient history and other diagnostic information is necessary to determine patient infection status. The expected result is Negative.  Fact Sheet for Patients: EntrepreneurPulse.com.au  Fact Sheet for Healthcare Providers: IncredibleEmployment.be  This test is not yet approved or cleared by the [place] FDA and  has been authorized for detection and/or diagnosis of SARS-CoV-2 by FDA under an Emergency Use Authorization (EUA).  This EUA will remain in effect (meaning this test can be used) for the duration of  the COVID-19 declaration under Section 564(b)(1) of the A ct, 21 U.S.C. section 360bbb-3(b)(1), unless the authorization is terminated or revoked sooner.     Influenza A by PCR NEGATIVE NEGATIVE Final   Influenza B by PCR NEGATIVE NEGATIVE Final    Comment: (NOTE) The Xpert Xpress SARS-CoV-2/FLU/RSV plus assay is  intended as an aid in the diagnosis of influenza from Nasopharyngeal swab specimens and should not be used as a sole basis for treatment. Nasal washings and aspirates are unacceptable for Xpert Xpress SARS-CoV-2/FLU/RSV testing.  Fact Sheet for Patients: EntrepreneurPulse.com.au  Fact Sheet for Healthcare Providers: IncredibleEmployment.be  This test is not yet approved or cleared by the [place] FDA and has been authorized for detection and/or diagnosis of SARS-CoV-2 by FDA under an Emergency Use Authorization (EUA). This EUA will remain in effect (meaning this test can be used) for the duration of the COVID-19 declaration under Section 564(b)(1) of the Act, 21 U.S.C. section 360bbb-3(b)(1), unless the authorization is terminated or revoked.  Performed at [facility], [place] [address] [place], [place] [address]     Labs: CBC: Recent Labs  Lab 11/24/21 [phone] 11/24/21 1234 11/25/21 0506 11/25/21 1732 11/26/21 0432 11/26/21 1711 11/27/21 0439 11/28/21 0457  WBC 7.6  --  5.5  --  6.2  --   --  6.7  HGB 10.3*   < > 8.8* 9.2* 9.8* 8.8* 8.9* 8.6*  HCT 32.4*   < > 27.9* 29.0* 30.2* 27.0* 26.7* 26.9*  MCV 102.9*  --  104.1*  --  101.3*  --   --  103.5*  PLT 394  --  338  --  377  --   --  340   < > = values in this interval not displayed.   Basic Metabolic Panel: Recent Labs  Lab 11/24/21 0917 11/25/21 0506 11/26/21 0432 11/27/21 0439 11/28/21 0457  NA 134* 135 136 135 135  K 4.2 3.7 3.2* 4.4 3.7  CL 97* 103 102 104 101  CO2 30 27 27 24 29   GLUCOSE 116* 102* 105* 98 104*  BUN 14 8 <5* 9 10  CREATININE 0.50 0.47 0.44 0.67 0.59  CALCIUM 8.5* 8.3* 8.5* 8.4* 8.3*  MG  --   --  1.9  --  1.8   Liver Function Tests: Recent Labs  Lab 11/24/21 0917  AST 17  ALT 12  ALKPHOS 96  BILITOT 0.4  PROT 6.3*  ALBUMIN 3.2*   CBG: No results for input(s): GLUCAP in the last 168 hours.  Discharge time spent:  greater than 30 minutes.  Signed: Flora Lipps, MD [facility] 11/28/2021

## 2022-01-29 ENCOUNTER — Other Ambulatory Visit: Payer: Self-pay

## 2022-01-29 ENCOUNTER — Emergency Department (HOSPITAL_BASED_OUTPATIENT_CLINIC_OR_DEPARTMENT_OTHER): Payer: Medicare Other

## 2022-01-29 ENCOUNTER — Emergency Department (HOSPITAL_BASED_OUTPATIENT_CLINIC_OR_DEPARTMENT_OTHER)
Admission: EM | Admit: 2022-01-29 | Discharge: 2022-01-29 | Disposition: A | Discharge status: Home / Self Care | Payer: Medicare Other | Attending: Emergency Medicine | Admitting: Emergency Medicine

## 2022-01-29 ENCOUNTER — Emergency Department (HOSPITAL_BASED_OUTPATIENT_CLINIC_OR_DEPARTMENT_OTHER): Payer: Medicare Other | Admitting: Radiology

## 2022-01-29 ENCOUNTER — Encounter (HOSPITAL_BASED_OUTPATIENT_CLINIC_OR_DEPARTMENT_OTHER): Payer: Self-pay | Admitting: Emergency Medicine

## 2022-01-29 DIAGNOSIS — W010XXA Fall on same level from slipping, tripping and stumbling without subsequent striking against object, initial encounter: Secondary | ICD-10-CM | POA: Diagnosis not present

## 2022-01-29 DIAGNOSIS — I10 Essential (primary) hypertension: Secondary | ICD-10-CM | POA: Diagnosis not present

## 2022-01-29 DIAGNOSIS — W19XXXA Unspecified fall, initial encounter: Secondary | ICD-10-CM

## 2022-01-29 DIAGNOSIS — Y92002 Bathroom of unspecified non-institutional (private) residence single-family (private) house as the place of occurrence of the external cause: Secondary | ICD-10-CM | POA: Insufficient documentation

## 2022-01-29 DIAGNOSIS — M79621 Pain in right upper arm: Secondary | ICD-10-CM | POA: Insufficient documentation

## 2022-01-29 DIAGNOSIS — M79651 Pain in right thigh: Secondary | ICD-10-CM | POA: Insufficient documentation

## 2022-01-29 DIAGNOSIS — F0390 Unspecified dementia without behavioral disturbance: Secondary | ICD-10-CM | POA: Insufficient documentation

## 2022-01-29 DIAGNOSIS — Z79899 Other long term (current) drug therapy: Secondary | ICD-10-CM | POA: Insufficient documentation

## 2022-01-29 LAB — CBC WITH DIFFERENTIAL/PLATELET
Abs Immature Granulocytes: 0.01 10*3/uL (ref 0.00–0.07)
Basophils Absolute: 0 10*3/uL (ref 0.0–0.1)
Basophils Relative: 1 %
Eosinophils Absolute: 0.2 10*3/uL (ref 0.0–0.5)
Eosinophils Relative: 6 %
HCT: 41.6 % (ref 36.0–46.0)
Hemoglobin: 13.2 g/dL (ref 12.0–15.0)
Immature Granulocytes: 0 %
Lymphocytes Relative: 19 %
Lymphs Abs: 0.8 10*3/uL (ref 0.7–4.0)
MCH: 31.7 pg (ref 26.0–34.0)
MCHC: 31.7 g/dL (ref 30.0–36.0)
MCV: 100 fL (ref 80.0–100.0)
Monocytes Absolute: 0.4 10*3/uL (ref 0.1–1.0)
Monocytes Relative: 9 %
Neutro Abs: 2.6 10*3/uL (ref 1.7–7.7)
Neutrophils Relative %: 65 %
Platelets: 221 10*3/uL (ref 150–400)
RBC: 4.16 MIL/uL (ref 3.87–5.11)
RDW: 12.4 % (ref 11.5–15.5)
WBC: 4 10*3/uL (ref 4.0–10.5)
nRBC: 0 % (ref 0.0–0.2)

## 2022-01-29 LAB — URINALYSIS, ROUTINE W REFLEX MICROSCOPIC
Bilirubin Urine: NEGATIVE
Glucose, UA: NEGATIVE mg/dL
Hgb urine dipstick: NEGATIVE
Ketones, ur: NEGATIVE mg/dL
Nitrite: NEGATIVE
Protein, ur: NEGATIVE mg/dL
Specific Gravity, Urine: 1.01 (ref 1.005–1.030)
pH: 6.5 (ref 5.0–8.0)

## 2022-01-29 LAB — COMPREHENSIVE METABOLIC PANEL
ALT: 10 U/L (ref 0–44)
AST: 16 U/L (ref 15–41)
Albumin: 3.9 g/dL (ref 3.5–5.0)
Alkaline Phosphatase: 92 U/L (ref 38–126)
Anion gap: 6 (ref 5–15)
BUN: 12 mg/dL (ref 8–23)
CO2: 31 mmol/L (ref 22–32)
Calcium: 9.3 mg/dL (ref 8.9–10.3)
Chloride: 99 mmol/L (ref 98–111)
Creatinine, Ser: 0.74 mg/dL (ref 0.44–1.00)
GFR, Estimated: >60 mL/min (ref >60)
Glucose, Bld: 89 mg/dL (ref 70–99)
Potassium: 4 mmol/L (ref 3.5–5.1)
Sodium: 136 mmol/L (ref 135–145)
Total Bilirubin: 0.4 mg/dL (ref 0.3–1.2)
Total Protein: 6.9 g/dL (ref 6.5–8.1)

## 2022-01-29 LAB — TROPONIN I (HIGH SENSITIVITY): Troponin I (High Sensitivity): 9 ng/L (ref <18)

## 2022-01-29 NOTE — ED Notes (Signed)
Ambulated Pt to Bathroom. Pt is a one person assist, per pt's norm though. Pt walked very well to and from the bathroom ?

## 2022-01-29 NOTE — Discharge Instructions (Addendum)
You came to the emergency department today to be evaluated for your injuries after suffering a fall.  You had no broken bones or dislocations to your upper arm, right thigh, chest, neck, or head.  Your lab results were reassuring.  Your urine sample did not show any signs of infection.  Please follow-up with your primary care doctor as needed. ? ?Get help right away if: ?You develop new bowel or bladder control problems. ?You have unusual weakness or numbness in your arms or legs. ?You feel faint. ?

## 2022-01-29 NOTE — ED Notes (Signed)
Patient transported to X-ray 

## 2022-01-29 NOTE — ED Triage Notes (Signed)
Patient arrives POV brought in by daughter- states patient fell last night around 1am onto her right side. Denies hitting head. Tripped over her walker. C/o right upper arm pain and hematoma to right outer thigh. Patient has been able to ambulate with her walker since fall. Given 1 aleve and 2 tylenol this morning.  ?

## 2022-01-29 NOTE — ED Provider Notes (Signed)
?[facility] ?Provider Note ? ? ?CSN: [id number] ?Arrival date & time: 01/29/22  1256 ? ?  ? ?History ? ?Chief Complaint  ?Patient presents with  ? Fall  ? Arm Injury  ? ? ?Susan Henry is a 88 y.o. female with a history of hypertension, arthritis, GERD, dementia.  Brought to the emergency department by her daughter with a complaint of right leg and right arm pain after suffering a fall.  Patient is alert to person and place only.  Level 5 caveat applies. ? ?Patient's daughter reports that patient lives at home and ambulates with a walker.  Daughter heard patient get up last night and heard her fall.  Did not witness the fall.  She was in the room immediately after patient fell and patient was found to be alert laying on her right side.  No episodes of vomiting after the fall.  Patient has been up and ambulatory with walker today.  Patient is complaining of pain to right upper arm and right thigh. ? ? ?Fall ? ?Arm Injury ? ?  ? ?Home Medications ?Prior to Admission medications   ?Medication Sig Start Date End Date Taking? Authorizing Provider  ?amLODipine (NORVASC) 5 MG tablet Take 5 mg by mouth every morning. 09/07/21   [provider]  ?citalopram (CELEXA) 20 MG tablet Take 20 mg by mouth every morning.    [provider]  ?donepezil (ARICEPT) 10 MG tablet Take 1 tablet (10 mg total) by mouth at bedtime. 05/30/17   Tat, Eustace Quail, DO  ?lisinopril (PRINIVIL,ZESTRIL) 20 MG tablet Take 20 mg by mouth every morning. 12/11/13   [provider]  ?Multiple Vitamins-Minerals (PRESERVISION AREDS 2) CAPS Take 2 capsules by mouth every morning.    [provider]  ?naproxen sodium (ALEVE) 220 MG tablet Take 220 mg by mouth 2 (two) times daily as needed (back pain).    [provider]  ?ondansetron (ZOFRAN) 4 MG tablet Take 1 tablet (4 mg total) by mouth every 6 (six) hours. ?Patient not taking: Reported on 11/24/2021 10/22/21   Domenic Moras, PA-C   ?pantoprazole (PROTONIX) 40 MG tablet Take 1 tablet (40 mg total) by mouth daily. 11/28/21 12/28/21  Pokhrel, Corrie Mckusick, MD  ?polyethylene glycol (MIRALAX / GLYCOLAX) 17 g packet Take 17 g by mouth daily as needed for mild constipation or moderate constipation. 11/28/21   Pokhrel, Corrie Mckusick, MD  ?Vitamin D, Ergocalciferol, (DRISDOL) 1.25 MG (50000 UNIT) CAPS capsule Take 50,000 Units by mouth every Thursday. 08/12/21   [provider]  ?   ? ?Allergies    ?Alprazolam   ? ?Review of Systems   ?Review of Systems  ?Unable to perform ROS: Dementia  ? ?Physical Exam ?Updated Vital Signs ?BP (!) 159/87 (BP Location: Left Arm)   Pulse 71   Temp (!) 96.9 ?F (36.1 ?C)   Resp 16   Ht 5\' 6"  (1.676 m)   Wt 57.2 kg   SpO2 96%   BMI 20.34 kg/m?  ?Physical Exam ?Vitals and nursing note reviewed.  ?Constitutional:   ?   General: She is not in acute distress. ?   Appearance: She is not ill-appearing, toxic-appearing or diaphoretic.  ?HENT:  ?   Head: Normocephalic and atraumatic. No raccoon eyes, Battle's sign, contusion, right periorbital erythema, left periorbital erythema or laceration.  ?   Jaw: No trismus or pain on movement.  ?Eyes:  ?   General: No scleral icterus.    ?   Right eye: No  discharge.     ?   Left eye: No discharge.  ?Cardiovascular:  ?   Rate and Rhythm: Normal rate.  ?Pulmonary:  ?   Effort: Pulmonary effort is normal.  ?Chest:  ?   Chest wall: No deformity, tenderness or crepitus.  ?Abdominal:  ?   General: Abdomen is flat. There is no distension. There are no signs of injury.  ?   Palpations: Abdomen is soft. There is no mass or pulsatile mass.  ?   Tenderness: There is no abdominal tenderness. There is no guarding or rebound.  ?Musculoskeletal:  ?   Right shoulder: No swelling, deformity, laceration, tenderness, bony tenderness or crepitus. Normal range of motion.  ?   Left shoulder: No swelling, deformity, laceration, tenderness, bony tenderness or crepitus. Normal range of motion.  ?   Right upper  arm: Tenderness present. No swelling, edema, deformity, lacerations or bony tenderness.  ?   Left upper arm: Normal.  ?   Right elbow: No swelling, deformity, effusion or lacerations. Normal range of motion. No tenderness.  ?   Left elbow: Normal.  ?   Right forearm: Normal.  ?   Left forearm: Normal.  ?   Right wrist: Normal.  ?   Left wrist: Normal.  ?   Right hand: No swelling, deformity, lacerations, tenderness or bony tenderness. Normal range of motion. Normal sensation. Normal capillary refill. Normal pulse.  ?   Left hand: No swelling, deformity, lacerations, tenderness or bony tenderness. Normal range of motion. Normal sensation. Normal capillary refill. Normal pulse.  ?   Cervical back: No swelling, edema, deformity, erythema, signs of trauma, lacerations, rigidity, spasms, torticollis, tenderness, bony tenderness or crepitus. No pain with movement. Normal range of motion.  ?   Thoracic back: No swelling, edema, deformity, signs of trauma, lacerations, spasms, tenderness or bony tenderness.  ?   Lumbar back: No swelling, edema, deformity, signs of trauma, lacerations, spasms, tenderness or bony tenderness.  ?   Right hip: No deformity, lacerations, tenderness, bony tenderness or crepitus.  ?   Left hip: No deformity, lacerations, tenderness, bony tenderness or crepitus.  ?   Right upper leg: Tenderness present. No swelling, edema, deformity, lacerations or bony tenderness.  ?   Left upper leg: Normal.  ?   Right knee: No swelling, deformity, effusion, erythema, ecchymosis, lacerations, bony tenderness or crepitus. Normal range of motion. No tenderness. Normal alignment.  ?   Left knee: No swelling, deformity, effusion, erythema, ecchymosis, lacerations, bony tenderness or crepitus. Normal range of motion. No tenderness. Normal alignment.  ?   Right lower leg: Normal.  ?   Left lower leg: Normal.  ?   Right ankle: No swelling, deformity, ecchymosis or lacerations. No tenderness. Normal range of motion.  ?    Left ankle: No swelling, deformity, ecchymosis or lacerations. No tenderness. Normal range of motion.  ?   Right foot: Normal range of motion and normal capillary refill. No swelling, deformity, tenderness, bony tenderness or crepitus. Normal pulse.  ?   Left foot: Normal range of motion and normal capillary refill. No swelling, deformity, tenderness, bony tenderness or crepitus. Normal pulse.  ?   Comments: No tenderness or deformity to cervical, thoracic, or lumbar spine.  Kyphosis of spine noted. ? ?Patient able to actively move all limbs without difficulty.  Does complain of pain to right upper arm with movement. ? ?Pelvis stable, no leg length discrepancy. ? ?Ecchymosis to right elbow  ?Skin: ?   General: Skin  is warm and dry.  ?Neurological:  ?   General: No focal deficit present.  ?   Mental Status: She is alert.  ?Psychiatric:     ?   Behavior: Behavior is cooperative.  ? ? ?ED Results / Procedures / Treatments   ?Labs ?(all labs ordered are listed, but only abnormal results are displayed) ?Labs Reviewed  ?URINALYSIS, ROUTINE W REFLEX MICROSCOPIC - Abnormal; Notable for the following components:  ?    Result Value  ? Leukocytes,Ua SMALL (*)   ? All other components within normal limits  ?URINE CULTURE  ?COMPREHENSIVE METABOLIC PANEL  ?CBC WITH DIFFERENTIAL/PLATELET  ?TROPONIN I (HIGH SENSITIVITY)  ? ? ?EKG ?None ? ?Radiology ?CT Head Wo Contrast ? ?Result Date: 01/29/2022 ?CLINICAL DATA:  Head trauma, minor (Age >= 65y); Neck trauma (Age >= 65y) EXAM: CT HEAD WITHOUT CONTRAST CT CERVICAL SPINE WITHOUT CONTRAST TECHNIQUE: Multidetector CT imaging of the head and cervical spine was performed following the standard protocol without intravenous contrast. Multiplanar CT image reconstructions of the cervical spine were also generated. RADIATION DOSE REDUCTION: This exam was performed according to the departmental dose-optimization program which includes automated exposure control, adjustment of the mA and/or kV  according to patient size and/or use of iterative reconstruction technique. COMPARISON:  None. FINDINGS: CT HEAD FINDINGS Moderate to severely motion limited study.  Within this limitation: Brain: No evidence of acute la

## 2022-01-30 LAB — URINE CULTURE: Culture: NO GROWTH

## 2022-07-15 ENCOUNTER — Telehealth: Payer: Self-pay | Admitting: Neurology

## 2022-07-15 ENCOUNTER — Encounter: Payer: Self-pay | Admitting: Neurology

## 2022-07-15 ENCOUNTER — Ambulatory Visit (INDEPENDENT_AMBULATORY_CARE_PROVIDER_SITE_OTHER): Payer: Medicare Other | Admitting: Neurology

## 2022-07-15 VITALS — BP 106/66 | HR 82 | Ht 66.0 in

## 2022-07-15 DIAGNOSIS — F0390 Unspecified dementia without behavioral disturbance: Secondary | ICD-10-CM

## 2022-07-15 DIAGNOSIS — I890 Lymphedema, not elsewhere classified: Secondary | ICD-10-CM

## 2022-07-15 MED ORDER — GABAPENTIN 50 MG PO TABS: 200.0000 mg | ORAL_TABLET | Freq: Every evening | ORAL | 11 refills | Status: AC | PRN

## 2022-07-15 NOTE — Telephone Encounter (Signed)
Alex from [facility] is asking for a call back re: pt's Gabapentin

## 2022-07-15 NOTE — Progress Notes (Signed)
Chief Complaint  Patient presents with   New Patient (Initial Visit)    Rm 14. Accompanied by daughter, Susan Henry. NP/[facility] [address] MD/meige syndrome. Daughter states gabapentin 100 mg is helpful for sleep, believes it may be causing worsening dementia.      ASSESSMENT AND PLAN  [name] is a 88 y.o. 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   MEDICAL HISTORY:  Susan Henry, is a 88 year old female, seen in request by primary care doctor   [name] for evaluation of facial grimace, dementia, initial evaluation was on July 15, 2022 with her daughter  I reviewed and summarized the referring note. PMHX.  Hypertension Dementia  Patient currently lives at nursing facility, had gradual worsening memory loss, still able to carry on simple conversation, tends to bite on her fingernails during today's interview, rely on her daughter to provide history, but pleasant, there frequent facial grimace,  She was seen by movement specialist Dr. Carles Collet  since 2016, reviewed her chart,  Reported more than 30 years history of gradual worsening facial abnormal movement, frequent eye blinking, spreading to lower face, involuntary grimace, moving towels inside her mouth, she had slow worsening memory loss,   She was diagnosed with Meige syndrome, began to receive Botox injection around 2018, responding some,  She is pleasant most of the time, but has difficulty sleeping sometimes, taking gabapentin  100 mg, if she take 2 tablets, she often get up lightheaded noted increased difficulty walking,  Last injection was Dr. Wells Guiles Tat was on January 12, 2022, used Botox a 30 units, injection was mainly around bilateral orbicularis oculi,  Personally reviewed CT cervical without contrast January 29, 2022: No acute abnormality, rotation of C1 on C2, anterolisthesis of C4 on C5, multilevel degenerative changes greatest at C5-6, enlarged heterogeneous thyroid,  CT head, mild atrophy, small vessel disease no acute abnormality  PHYSICAL EXAM:   Vitals:   07/15/22 1354  BP: 106/66  Pulse: 82  Height: 5\' 6"  (1.676 m)   Not recorded     Body mass index is 20.34 kg/m.  PHYSICAL EXAMNIATION:  Gen: NAD, conversant, well nourised, well groomed                     Cardiovascular: Regular rate rhythm, no peripheral edema, warm, nontender. Eyes: Conjunctivae clear without exudates or hemorrhage Neck: Supple, no carotid bruits. Pulmonary: Clear to auscultation bilaterally   NEUROLOGICAL EXAM:  MENTAL STATUS: Speech/cognition: Awake, alert, hard of hearing, biting her nails, reliant on her daughter to provide history, frequent grimace, facial dystonic movement involving cheek muscles, also platysmas     08/30/2017    1:42 PM 08/30/2016    1:10 PM  Montreal Cognitive Assessment   Visuospatial/ Executive (0/5) 1 2  Naming (0/3) 3 3  Attention: Read list of digits (0/2) 2 1  Attention: Read list of letters (0/1) 1 1  Attention: Serial 7 subtraction starting at 100 (0/3) 0 2  Language: Repeat phrase (0/2) 2 2  Language : Fluency (0/1) 1 1  Abstraction (0/2) 1 1  Delayed Recall (0/5) 0 0  Orientation (0/6) 2 4  Total 13 17  Adjusted Score (based on education) 14 18    CRANIAL NERVES: CN II: Visual fields are full to confrontation. Pupils are round equal and briskly reactive to light. CN III, IV, VI: extraocular movement are normal. No ptosis. CN V: Facial sensation is intact to light  touch CN VII: Face is symmetric with normal eye closure  CN VIII: Hard of hearing bilaterally CN IX, X: Phonation is normal. CN XI: Head turning and shoulder shrug are intact  MOTOR: Mild fixation of left arm on rapid alternating movement, mild left lower extremity drift  REFLEXES: Reflexes are 1  and symmetric at the biceps, triceps, 1 knees, and absent at ankles. Plantar responses are flexor.  SENSORY: Intact to light touch,   COORDINATION: There is no trunk or limb dysmetria noted.  GAIT/STANCE: Need help to get up from seated position, unsteady, severe kyphosis  REVIEW OF SYSTEMS:  Full 14 system review of systems performed and notable only for as above All other review of systems were negative.   ALLERGIES: Allergies  Allergen Reactions   Alprazolam Rash    HOME MEDICATIONS: Current Outpatient Medications  Medication Sig Dispense Refill   amLODipine (NORVASC) 5 MG tablet Take 5 mg by mouth every morning.     cephALEXin (KEFLEX) 500 MG capsule Take 500 mg by mouth 3 (three) times daily.     citalopram (CELEXA) 20 MG tablet Take 20 mg by mouth every morning.     gabapentin (NEURONTIN) 100 MG capsule Take 100-300 mg by mouth at bedtime as needed.     lisinopril (PRINIVIL,ZESTRIL) 20 MG tablet Take 20 mg by mouth every morning.     methenamine (MANDELAMINE) 0.5 GM tablet Take 500 mg by mouth 2 (two) times daily.     Multiple Vitamins-Minerals (PRESERVISION AREDS 2) CAPS Take 2 capsules by mouth every morning.     naproxen sodium (ALEVE) 220 MG tablet Take 220 mg by mouth 2 (two) times daily as needed (back pain).     polyethylene glycol (MIRALAX / GLYCOLAX) 17 g packet Take 17 g by mouth daily as needed for mild constipation or moderate constipation. 14 each 0   Probiotic Product (PROBIOTIC PO) Take 1 capsule by mouth daily. 60 billion probiotic     Vitamin D, Ergocalciferol, (DRISDOL) 1.25 MG (50000 UNIT) CAPS capsule Take 50,000 Units by mouth every Thursday.      pantoprazole (PROTONIX) 40 MG tablet Take 1 tablet (40 mg total) by mouth daily. 30 tablet 0   No current facility-administered medications for this visit.    PAST MEDICAL HISTORY: Past Medical History:  Diagnosis Date   Allergy    Seasonal   Anemia    Iron deficiency    Anxiety    Aortic sclerosis    Arthritis    Blood transfusion    Colon polyps    Hx of  adenomatous   Depression    Diverticulosis    GERD (gastroesophageal reflux disease)    Hemorrhoids, internal, with bleeding 01/08/2011   Hypertension    Insomnia    Pneumonia    Reflux     PAST SURGICAL HISTORY: Past Surgical History:  Procedure Laterality Date   CHOLECYSTECTOMY     COLON SURGERY     COLONOSCOPY  01/2011   COLONOSCOPY  11/18/2011   Procedure: COLONOSCOPY;  Surgeon: [name], MD;  Location: [facility];  Service: Endoscopy;  Laterality: N/A;   COLONOSCOPY  11/24/2011   Procedure: COLONOSCOPY;  Surgeon: Erick Blinks, MD;  Location: [facility];  Service: Gastroenterology;  Laterality: N/A;   ESOPHAGOGASTRODUODENOSCOPY  11/18/2011   Procedure: ESOPHAGOGASTRODUODENOSCOPY (EGD);  Surgeon: [name], MD;  Location: [facility];  Service: Endoscopy;  Laterality: N/A;   LOW ANTERIOR BOWEL RESECTION  1996   diverticulitis   UPPER GASTROINTESTINAL ENDOSCOPY      FAMILY HISTORY: Family History  Problem Relation Age of Onset   Pancreatic cancer Father    Colon cancer Neg Hx    Anesthesia problems Neg Hx    Hypotension Neg Hx    Malignant hyperthermia Neg Hx    Pseudochol deficiency Neg Hx     SOCIAL HISTORY: Social History   Socioeconomic History   Marital status: Widowed    Spouse name: Not on file   Number of children: Not on file   Years of education: Not on file   Highest education level: Not on file  Occupational History   Occupation: Unemployed     Comment: Homemaker  Tobacco Use   Smoking status: Never   Smokeless tobacco: Never  Vaping Use   Vaping Use: Never used   Substance and Sexual Activity   Alcohol use: Yes    Alcohol/week: 0.0 standard drinks of alcohol    Comment: occasional   Drug use: No   Sexual activity: Never  Other Topics Concern   Not on file  Social History Narrative   Right handed   1 story    Lives with spouse   Social Determinants of Health   Financial Resource Strain: Not on file  Food Insecurity: Not on file  Transportation Needs: Not on file  Physical Activity: Not on file  Stress: Not on file  Social Connections: Not on file  Intimate Partner Violence: Not on file      Levert Feinstein, M.D. Ph.D.  [facility] [address], [address] [place], [place] [address] Ph: [phone] Fax: [phone]  CC:  Shon Hale, MD [address] [place],  [place] [address]  Shon Hale, MD

## 2022-07-15 NOTE — Telephone Encounter (Signed)
I spoke with the pharmacist. She stated Gabapentin does not come available in 50 MG. The lowest dose of Gabapentin is 100 MG capsules.

## 2022-07-16 MED ORDER — GABAPENTIN 100 MG PO CAPS: 100.0000 mg | ORAL_CAPSULE | Freq: Every evening | ORAL | 3 refills | Status: AC | PRN

## 2022-07-16 NOTE — Telephone Encounter (Signed)
Meds ordered this encounter  Medications   gabapentin (NEURONTIN) 100 MG capsule    Sig: Take 1 capsule (100 mg total) by mouth at bedtime as needed.    Dispense:  90 capsule    Refill:  3

## 2022-07-28 ENCOUNTER — Telehealth: Payer: Self-pay

## 2022-07-28 NOTE — Telephone Encounter (Signed)
Request for Xeomin 50 units   J code: [id number] CPT code: 55732 & 20254  Diagnosis G24.5

## 2022-07-29 ENCOUNTER — Other Ambulatory Visit (HOSPITAL_COMMUNITY): Payer: Self-pay

## 2022-07-29 NOTE — Telephone Encounter (Signed)
Xeomin Benefits Verification has been submitted 

## 2022-08-03 ENCOUNTER — Emergency Department (HOSPITAL_COMMUNITY)
Admission: EM | Admit: 2022-08-03 | Discharge: 2022-08-03 | Disposition: A | Discharge status: Home / Self Care | Payer: Medicare Other | Attending: Emergency Medicine | Admitting: Emergency Medicine

## 2022-08-03 ENCOUNTER — Emergency Department (HOSPITAL_COMMUNITY): Payer: Medicare Other

## 2022-08-03 DIAGNOSIS — F0390 Unspecified dementia without behavioral disturbance: Secondary | ICD-10-CM | POA: Insufficient documentation

## 2022-08-03 DIAGNOSIS — R5383 Other fatigue: Secondary | ICD-10-CM | POA: Diagnosis not present

## 2022-08-03 DIAGNOSIS — R531 Weakness: Secondary | ICD-10-CM | POA: Diagnosis present

## 2022-08-03 DIAGNOSIS — R410 Disorientation, unspecified: Secondary | ICD-10-CM | POA: Diagnosis not present

## 2022-08-03 DIAGNOSIS — Z20822 Contact with and (suspected) exposure to covid-19: Secondary | ICD-10-CM | POA: Diagnosis not present

## 2022-08-03 LAB — CBG MONITORING, ED: Glucose-Capillary: 118 mg/dL — ABNORMAL HIGH (ref 70–99)

## 2022-08-03 LAB — URINALYSIS, ROUTINE W REFLEX MICROSCOPIC
Bilirubin Urine: NEGATIVE
Glucose, UA: NEGATIVE mg/dL
Hgb urine dipstick: NEGATIVE
Ketones, ur: NEGATIVE mg/dL
Leukocytes,Ua: NEGATIVE
Nitrite: NEGATIVE
Protein, ur: NEGATIVE mg/dL
Specific Gravity, Urine: 1.006 (ref 1.005–1.030)
pH: 6 (ref 5.0–8.0)

## 2022-08-03 LAB — COMPREHENSIVE METABOLIC PANEL
ALT: 13 U/L (ref 0–44)
AST: 20 U/L (ref 15–41)
Albumin: 3.5 g/dL (ref 3.5–5.0)
Alkaline Phosphatase: 85 U/L (ref 38–126)
Anion gap: 8 (ref 5–15)
BUN: 10 mg/dL (ref 8–23)
CO2: 29 mmol/L (ref 22–32)
Calcium: 8.5 mg/dL — ABNORMAL LOW (ref 8.9–10.3)
Chloride: 99 mmol/L (ref 98–111)
Creatinine, Ser: 0.64 mg/dL (ref 0.44–1.00)
GFR, Estimated: >60 mL/min (ref >60)
Glucose, Bld: 126 mg/dL — ABNORMAL HIGH (ref 70–99)
Potassium: 3.7 mmol/L (ref 3.5–5.1)
Sodium: 136 mmol/L (ref 135–145)
Total Bilirubin: 0.4 mg/dL (ref 0.3–1.2)
Total Protein: 6.6 g/dL (ref 6.5–8.1)

## 2022-08-03 LAB — TROPONIN I (HIGH SENSITIVITY)
Troponin I (High Sensitivity): 11 ng/L (ref <18)
Troponin I (High Sensitivity): 11 ng/L (ref <18)

## 2022-08-03 LAB — CBC WITH DIFFERENTIAL/PLATELET
Abs Immature Granulocytes: 0.03 10*3/uL (ref 0.00–0.07)
Basophils Absolute: 0 10*3/uL (ref 0.0–0.1)
Basophils Relative: 0 %
Eosinophils Absolute: 0.1 10*3/uL (ref 0.0–0.5)
Eosinophils Relative: 1 %
HCT: 37.2 % (ref 36.0–46.0)
Hemoglobin: 11.9 g/dL — ABNORMAL LOW (ref 12.0–15.0)
Immature Granulocytes: 0 %
Lymphocytes Relative: 9 %
Lymphs Abs: 0.9 10*3/uL (ref 0.7–4.0)
MCH: 32.4 pg (ref 26.0–34.0)
MCHC: 32 g/dL (ref 30.0–36.0)
MCV: 101.4 fL — ABNORMAL HIGH (ref 80.0–100.0)
Monocytes Absolute: 0.6 10*3/uL (ref 0.1–1.0)
Monocytes Relative: 6 %
Neutro Abs: 8.2 10*3/uL — ABNORMAL HIGH (ref 1.7–7.7)
Neutrophils Relative %: 84 %
Platelets: 306 10*3/uL (ref 150–400)
RBC: 3.67 MIL/uL — ABNORMAL LOW (ref 3.87–5.11)
RDW: 13.5 % (ref 11.5–15.5)
WBC: 9.8 10*3/uL (ref 4.0–10.5)
nRBC: 0 % (ref 0.0–0.2)

## 2022-08-03 LAB — LIPASE, BLOOD: Lipase: 68 U/L — ABNORMAL HIGH (ref 11–51)

## 2022-08-03 LAB — SARS CORONAVIRUS 2 BY RT PCR: SARS Coronavirus 2 by RT PCR: NEGATIVE

## 2022-08-03 MED ORDER — LACTATED RINGERS IV BOLUS
500.0000 mL | Freq: Once | INTRAVENOUS | Status: AC
  Administered 2022-08-03: 500 mL via INTRAVENOUS

## 2022-08-03 NOTE — ED Triage Notes (Signed)
EMS brings pt in from home for generalized weakness. Family states they took pt out today and reports pt seems weak now.

## 2022-08-03 NOTE — ED Provider Notes (Signed)
[facility]-EMERGENCY DEPT Provider Note   CSN: [id number] Arrival date & time: 08/03/22  1430     History Chief Complaint  Patient presents with   Weakness    HPI CHELBIE JARNAGIN is a 88 y.o. female presenting for generalized weakness and fatigue.  She is a 88 year old female with a history of dementia she requires 24/7 assist at home.  Her daughter at bedside reports that she is required increased physical assistance more than her baseline.  She has required complete transfer and has been unwilling to walk.  Patient denies any symptoms this time.  She did collapse to the ground when she got tired of walking in from going out earlier today.  Recent urinary tract infection Has been doing well until today.   Patient's recorded medical, surgical, social, medication list and allergies were reviewed in the Snapshot window as part of the initial history.   Review of Systems   Review of Systems  Unable to perform ROS: Dementia    Physical Exam Updated Vital Signs BP (!) 143/68   Pulse 77   Temp 97.9 F (36.6 C) (Oral)   Resp 16   SpO2 96%  Physical Exam Vitals and nursing note reviewed.  Constitutional:      General: She is not in acute distress.    Appearance: She is well-developed.  HENT:     Head: Normocephalic and atraumatic.  Eyes:     Conjunctiva/sclera: Conjunctivae normal.  Cardiovascular:     Rate and Rhythm: Normal rate and regular rhythm.     Heart sounds: No murmur heard. Pulmonary:     Effort: Pulmonary effort is normal. No respiratory distress.     Breath sounds: Normal breath sounds.  Abdominal:     Palpations: Abdomen is soft.     Tenderness: There is no abdominal tenderness.  Musculoskeletal:        General: No swelling.     Cervical back: Neck supple.  Skin:    General: Skin is warm and dry.     Capillary Refill: Capillary refill takes less than 2 seconds.  Neurological:     Mental Status: She is alert. She is disoriented.      Cranial Nerves: No cranial nerve deficit.     Motor: Weakness (symetric 4/5) present.  Psychiatric:        Mood and Affect: Mood normal.      ED Course/ Medical Decision Making/ A&P    Procedures Procedures   Medications Ordered in ED Medications  lactated ringers bolus 500 mL (0 mLs Intravenous Stopped 08/03/22 1806)    Medical Decision Making:    AMADI YOSHINO is a 88 y.o. female who presented to the ED today with altered mental status detailed above.     Additional history discussed with patient's family/caregivers.  Patient's presentation is complicated by their history of multiple comorbid medical problems, advanced age.  Patient placed on continuous vitals and telemetry monitoring while in ED which was reviewed periodically.   Complete initial physical exam performed, notably the patient  was medically stable in no acute distress.  She has diffuse 4 out of 5 strength.      Reviewed and confirmed nursing documentation for past medical history, family history, social history.    Initial Assessment:   With the patient's presentation of altered mental status, most likely diagnosis is delirium in the setting of dementia. Other diagnoses were considered including (but not limited to) meningitis, encephalitis, metabolic disturbance, urinary tract infection, pneumonia,  COVID infection, endocrinologic disturbance, cranial hemorrhage, CVA. These are considered less likely due to history of present illness and physical exam findings.   This is most consistent with an acute life/limb threatening illness complicated by underlying chronic conditions.  Initial Plan:  CT head to evaluate for intracranial etiology of patient's altered mental status Screening labs including CBC and Metabolic panel to evaluate for infectious or metabolic etiology of disease.  Urinalysis with reflex culture ordered to evaluate for UTI or relevant urologic/nephrologic pathology.  CXR to evaluate for  structural/infectious intrathoracic pathology.  EKG and troponin to evaluate for cardiac pathology. X-ray left shoulder due to history of minor traumatic subluxation and localizing pain at that site on exam Objective evaluation as below reviewed with plan for close reassessment  Initial Study Results:   Laboratory  All laboratory results reviewed without evidence of clinically relevant pathology.   EKG EKG was reviewed independently. Rate, rhythm, axis, intervals all examined and without medically relevant abnormality. ST segments without concerns for elevations.    Radiology  All images reviewed independently. Agree with radiology report at this time.   CT HEAD WO CONTRAST ( )  Result Date: 08/03/2022 CLINICAL DATA:  Head trauma, minor (Age >= 65y) EXAM: CT HEAD WITHOUT CONTRAST TECHNIQUE: Contiguous axial images were obtained from the base of the skull through the vertex without intravenous contrast. RADIATION DOSE REDUCTION: This exam was performed according to the departmental dose-optimization program which includes automated exposure control, adjustment of the mA and/or kV according to patient size and/or use of iterative reconstruction technique. COMPARISON:  Head CT 01/29/2022 FINDINGS: Brain: No evidence of acute intracranial hemorrhage or extra-axial collection. No loss of gray-white matter differentiation.Patent basal cisterns.The ventricles are unchanged in size.Confluent periventricular and subcortical white matter hypoattenuation, which is nonspecific but likely sequela of chronic small vessel ischemic disease.Mild cerebral atrophy Vascular: Vascular calcifications. Skull: Negative for skull fracture. Sinuses/Orbits: Mild mucosal thickening in the left sphenoid sinus. Other: None. IMPRESSION: No acute intracranial abnormality. Chronic small vessel ischemic disease. Electronically Signed   By: Caprice Renshaw M.D.   On: 08/03/2022 16:35   DG Shoulder Left  Result Date:  08/03/2022 CLINICAL DATA:  Generalized weakness. EXAM: LEFT SHOULDER - 2+ VIEW COMPARISON:  Left shoulder radiographs 09/22/2021 (demonstrating anterior shoulder dislocation followed by subsequent reduction. FINDINGS: There is diffuse decreased bone mineralization. The glenohumeral joint is appropriately aligned. Moderate inferior humeral head-neck junction degenerative osteophytosis. The humeral head is high-riding. Mild peripheral clavicular head degenerative osteophytes. No acute fracture is seen. No dislocation. Moderate multilevel degenerative disc changes of the midthoracic spine. IMPRESSION: 1. Moderate glenohumeral osteoarthritis.  No dislocation. 2. High-riding humeral head suggesting chronic rotator cuff tear. Electronically Signed   By: Neita Garnet M.D.   On: 08/03/2022 16:19   DG Chest Portable 1 View  Result Date: 08/03/2022 CLINICAL DATA:  Altered mental status. EXAM: PORTABLE CHEST 1 VIEW COMPARISON:  January 29, 2022. FINDINGS: Stable cardiomediastinal silhouette. No acute pulmonary disease is noted. Bony thorax is unremarkable. IMPRESSION: No active disease. Electronically Signed   By: Lupita Raider M.D.   On: 08/03/2022 16:16     Final Assessment and Plan:   On repeat evaluation, patient is now ambulatory and has been tolerating p.o. intake.  She is in no acute distress.  No objective evaluation abnormalities today.  Had a prolonged conversation with patient's daughter at bedside.  She feels comfortable going home and having patient follow-up with PCP in the morning.  She understands that patient will likely require  require escalating care if she is having difficulty with ambulation and that this should likely be arranged sooner rather than later or patient risks needing to return to the emergency department for safety.  At this time, patient's daughter feels that they can take care of her at home with the hired assistance that she has 24/7. Patient is currently in no acute distress no  focal pathology on objective evaluation and patient stable for outpatient care and management.  Nursing assistant walked patient to the bathroom where patient was able to ambulate at her baseline per the daughter.  Patient able to be discharged with close return precautions and follow-up with PCP.  Clinical Impression:  1. Weakness      Discharge   Final Clinical Impression(s) / ED Diagnoses Final diagnoses:  Weakness    Rx / DC Orders ED Discharge Orders     None         Tretha Sciara, MD 08/03/22 2106

## 2022-08-03 NOTE — ED Notes (Signed)
NT Faith was able to get pt up with walker and assist pt to the BY by use of one assist. Dr. Oswald Hillock updated of pt's status by NT [facility]

## 2022-08-04 NOTE — Telephone Encounter (Addendum)
Buy and Bill No Prior Authorization Needed   

## 2022-08-04 NOTE — Telephone Encounter (Signed)
Scheduled xeomin injection with Dr. Krista Blue with pt's daughter over the phone. Injection is scheduled for 08/25/2022 at 4:00pm

## 2022-08-20 ENCOUNTER — Emergency Department (HOSPITAL_COMMUNITY): Payer: Medicare Other

## 2022-08-20 ENCOUNTER — Emergency Department (HOSPITAL_COMMUNITY)
Admission: EM | Admit: 2022-08-20 | Discharge: 2022-08-20 | Disposition: A | Discharge status: Home / Self Care | Payer: Medicare Other | Attending: Emergency Medicine | Admitting: Emergency Medicine

## 2022-08-20 DIAGNOSIS — W182XXA Fall in (into) shower or empty bathtub, initial encounter: Secondary | ICD-10-CM | POA: Diagnosis not present

## 2022-08-20 DIAGNOSIS — Z79899 Other long term (current) drug therapy: Secondary | ICD-10-CM | POA: Insufficient documentation

## 2022-08-20 DIAGNOSIS — F0390 Unspecified dementia without behavioral disturbance: Secondary | ICD-10-CM | POA: Diagnosis not present

## 2022-08-20 DIAGNOSIS — N3000 Acute cystitis without hematuria: Secondary | ICD-10-CM | POA: Insufficient documentation

## 2022-08-20 DIAGNOSIS — W19XXXA Unspecified fall, initial encounter: Secondary | ICD-10-CM

## 2022-08-20 DIAGNOSIS — R410 Disorientation, unspecified: Secondary | ICD-10-CM | POA: Insufficient documentation

## 2022-08-20 LAB — URINALYSIS, ROUTINE W REFLEX MICROSCOPIC
Bilirubin Urine: NEGATIVE
Glucose, UA: NEGATIVE mg/dL
Hgb urine dipstick: NEGATIVE
Ketones, ur: NEGATIVE mg/dL
Nitrite: POSITIVE — AB
Protein, ur: NEGATIVE mg/dL
Specific Gravity, Urine: 1.005 (ref 1.005–1.030)
WBC, UA: >50 WBC/hpf — ABNORMAL HIGH (ref 0–5)
pH: 7 (ref 5.0–8.0)

## 2022-08-20 LAB — COMPREHENSIVE METABOLIC PANEL
ALT: 14 U/L (ref 0–44)
AST: 17 U/L (ref 15–41)
Albumin: 3.7 g/dL (ref 3.5–5.0)
Alkaline Phosphatase: 86 U/L (ref 38–126)
Anion gap: 12 (ref 5–15)
BUN: 13 mg/dL (ref 8–23)
CO2: 26 mmol/L (ref 22–32)
Calcium: 9.1 mg/dL (ref 8.9–10.3)
Chloride: 97 mmol/L — ABNORMAL LOW (ref 98–111)
Creatinine, Ser: 0.71 mg/dL (ref 0.44–1.00)
GFR, Estimated: >60 mL/min (ref >60)
Glucose, Bld: 119 mg/dL — ABNORMAL HIGH (ref 70–99)
Potassium: 4 mmol/L (ref 3.5–5.1)
Sodium: 135 mmol/L (ref 135–145)
Total Bilirubin: 0.6 mg/dL (ref 0.3–1.2)
Total Protein: 7.2 g/dL (ref 6.5–8.1)

## 2022-08-20 LAB — CBC WITH DIFFERENTIAL/PLATELET
Abs Immature Granulocytes: 0.01 10*3/uL (ref 0.00–0.07)
Basophils Absolute: 0 10*3/uL (ref 0.0–0.1)
Basophils Relative: 1 %
Eosinophils Absolute: 0.3 10*3/uL (ref 0.0–0.5)
Eosinophils Relative: 6 %
HCT: 38.9 % (ref 36.0–46.0)
Hemoglobin: 12.8 g/dL (ref 12.0–15.0)
Immature Granulocytes: 0 %
Lymphocytes Relative: 29 %
Lymphs Abs: 1.5 10*3/uL (ref 0.7–4.0)
MCH: 33 pg (ref 26.0–34.0)
MCHC: 32.9 g/dL (ref 30.0–36.0)
MCV: 100.3 fL — ABNORMAL HIGH (ref 80.0–100.0)
Monocytes Absolute: 0.5 10*3/uL (ref 0.1–1.0)
Monocytes Relative: 10 %
Neutro Abs: 2.7 10*3/uL (ref 1.7–7.7)
Neutrophils Relative %: 54 %
Platelets: 317 10*3/uL (ref 150–400)
RBC: 3.88 MIL/uL (ref 3.87–5.11)
RDW: 13.2 % (ref 11.5–15.5)
WBC: 5.1 10*3/uL (ref 4.0–10.5)
nRBC: 0 % (ref 0.0–0.2)

## 2022-08-20 LAB — ETHANOL: Alcohol, Ethyl (B): <10 mg/dL (ref <10)

## 2022-08-20 LAB — CK: Total CK: 45 U/L (ref 38–234)

## 2022-08-20 LAB — CBG MONITORING, ED: Glucose-Capillary: 127 mg/dL — ABNORMAL HIGH (ref 70–99)

## 2022-08-20 MED ORDER — ACETAMINOPHEN 500 MG PO TABS
1000.0000 mg | ORAL_TABLET | Freq: Once | ORAL | Status: AC
  Administered 2022-08-20: 1000 mg via ORAL
  Filled 2022-08-20: qty 2

## 2022-08-20 MED ORDER — CEPHALEXIN 500 MG PO CAPS: ORAL_CAPSULE | ORAL | 0 refills | Status: AC

## 2022-08-20 MED ORDER — CEPHALEXIN 250 MG PO CAPS
1000.0000 mg | ORAL_CAPSULE | Freq: Once | ORAL | Status: AC
  Administered 2022-08-20: 1000 mg via ORAL
  Filled 2022-08-20: qty 4

## 2022-08-20 NOTE — ED Provider Notes (Signed)
[facility] EMERGENCY DEPARTMENT Provider Note   CSN: [id number] Arrival date & time: 08/20/22  0116     History  Chief Complaint  Patient presents with   Marletta Lor    Susan Henry is a 88 y.o. female.  88 yo F with a chief complaint of a fall.  Patient was found on the ground in the bathroom.  Unsure exactly how she got there.  Unknown how long she had been there.  EMS was called and she was brought here for evaluation.  Per the EMS report the patient has been off of it recently.  They think maybe she has a urinary tract infection.  Patient states that she hurts in her left shoulder and her neck after the incident.  Level 5 caveat dementia.   Fall       Home Medications Prior to Admission medications   Medication Sig Start Date End Date Taking? Authorizing Provider  cephALEXin (KEFLEX) 500 MG capsule 2 caps po bid x 7 days 08/20/22  Yes Melene Plan, DO  amLODipine (NORVASC) 5 MG tablet Take 5 mg by mouth every morning. 09/07/21   [provider]  citalopram (CELEXA) 20 MG tablet Take 20 mg by mouth every morning.    [provider]  gabapentin (NEURONTIN) 100 MG capsule Take 100-300 mg by mouth at bedtime as needed. 05/06/22   [provider]  gabapentin (NEURONTIN) 100 MG capsule Take 1 capsule (100 mg total) by mouth at bedtime as needed. 07/16/22   Levert Feinstein, MD  Gabapentin 50 MG TABS Take 200 mg by mouth at bedtime as needed. 07/15/22   Levert Feinstein, MD  lisinopril (PRINIVIL,ZESTRIL) 20 MG tablet Take 20 mg by mouth every morning. 12/11/13   [provider]  methenamine (MANDELAMINE) 0.5 GM tablet Take 500 mg by mouth 2 (two) times daily. 06/17/22   [provider]  Multiple Vitamins-Minerals (PRESERVISION AREDS 2) CAPS Take 2 capsules by mouth every morning.    [provider]  naproxen sodium (ALEVE) 220 MG tablet Take 220 mg by mouth 2 (two) times daily as needed (back pain).    [provider]   pantoprazole (PROTONIX) 40 MG tablet Take 1 tablet (40 mg total) by mouth daily. 11/28/21 12/28/21  Pokhrel, Rebekah Chesterfield, MD  polyethylene glycol (MIRALAX / GLYCOLAX) 17 g packet Take 17 g by mouth daily as needed for mild constipation or moderate constipation. 11/28/21   Pokhrel, Rebekah Chesterfield, MD  Probiotic Product (PROBIOTIC PO) Take 1 capsule by mouth daily. 60 billion probiotic    [provider]  Vitamin D, Ergocalciferol, (DRISDOL) 1.25 MG (50000 UNIT) CAPS capsule Take 50,000 Units by mouth every Thursday. 08/12/21   [provider]      Allergies    Alprazolam    Review of Systems   Review of Systems  Physical Exam Updated Vital Signs BP (!) 154/84   Pulse 76   Temp 98.1 F (36.7 C) (Oral)   Resp 18   Ht 5\' 6"  (1.676 m)   Wt 57 kg   SpO2 99%   BMI 20.28 kg/m  Physical Exam Vitals and nursing note reviewed.  Constitutional:      General: She is not in acute distress.    Appearance: She is well-developed. She is not diaphoretic.  HENT:     Head: Normocephalic and atraumatic.  Eyes:     Pupils: Pupils are equal, round, and reactive to light.  Cardiovascular:     Rate and Rhythm: Normal  rate and regular rhythm.     Heart sounds: No murmur heard.    No friction rub. No gallop.  Pulmonary:     Effort: Pulmonary effort is normal.     Breath sounds: No wheezing or rales.  Abdominal:     General: There is no distension.     Palpations: Abdomen is soft.     Tenderness: There is no abdominal tenderness.  Musculoskeletal:        General: No tenderness.     Cervical back: Normal range of motion and neck supple.  Skin:    General: Skin is warm and dry.  Neurological:     Mental Status: She is alert.     Comments: Some confused response.  Appears to be hard of hearing.  Psychiatric:        Behavior: Behavior normal.     ED Results / Procedures / Treatments   Labs (all labs ordered are listed, but only abnormal results are displayed) Labs Reviewed  CBC WITH  DIFFERENTIAL/PLATELET - Abnormal; Notable for the following components:      Result Value   MCV 100.3 (*)    All other components within normal limits  COMPREHENSIVE METABOLIC PANEL - Abnormal; Notable for the following components:   Chloride 97 (*)    Glucose, Bld 119 (*)    All other components within normal limits  URINALYSIS, ROUTINE W REFLEX MICROSCOPIC - Abnormal; Notable for the following components:   APPearance HAZY (*)    Nitrite POSITIVE (*)    Leukocytes,Ua LARGE (*)    WBC, UA >50 (*)    Bacteria, UA RARE (*)    All other components within normal limits  CBG MONITORING, ED - Abnormal; Notable for the following components:   Glucose-Capillary 127 (*)    All other components within normal limits  URINE CULTURE  CK  ETHANOL    EKG None  Radiology CT Cervical Spine Wo Contrast  Result Date: 08/20/2022 CLINICAL DATA:  Fall EXAM: CT CERVICAL SPINE WITHOUT CONTRAST TECHNIQUE: Multidetector CT imaging of the cervical spine was performed without intravenous contrast. Multiplanar CT image reconstructions were also generated. RADIATION DOSE REDUCTION: This exam was performed according to the departmental dose-optimization program which includes automated exposure control, adjustment of the mA and/or kV according to patient size and/or use of iterative reconstruction technique. COMPARISON:  None Available. FINDINGS: Alignment: Normal Skull base and vertebrae: No acute fracture. No primary bone lesion or focal pathologic process. Soft tissues and spinal canal: No prevertebral fluid or swelling. No visible canal hematoma. Disc levels: Mild degenerative disc disease most pronounced in the lower cervical spine. Moderate bilateral degenerative facet disease diffusely. Upper chest: No acute findings. Other: None IMPRESSION: No acute bony abnormality. Electronically Signed   By: Charlett Nose M.D.   On: 08/20/2022 02:35   CT Head Wo Contrast  Result Date: 08/20/2022 CLINICAL DATA:  Head  trauma, minor (Age >= 65y).  Fall EXAM: CT HEAD WITHOUT CONTRAST TECHNIQUE: Contiguous axial images were obtained from the base of the skull through the vertex without intravenous contrast. RADIATION DOSE REDUCTION: This exam was performed according to the departmental dose-optimization program which includes automated exposure control, adjustment of the mA and/or kV according to patient size and/or use of iterative reconstruction technique. COMPARISON:  08/03/2022 FINDINGS: Brain: There is atrophy and chronic small vessel disease changes. No acute intracranial abnormality. Specifically, no hemorrhage, hydrocephalus, mass lesion, acute infarction, or significant intracranial injury. Vascular: No hyperdense vessel or unexpected calcification. Skull: No  acute calvarial abnormality. Sinuses/Orbits: No acute findings Other: None IMPRESSION: Atrophy, chronic microvascular disease. No acute intracranial abnormality. Electronically Signed   By: Rolm Baptise M.D.   On: 08/20/2022 02:33   DG Shoulder Left  Result Date: 08/20/2022 CLINICAL DATA:  Fall EXAM: LEFT SHOULDER - 2+ VIEW COMPARISON:  None Available. FINDINGS: No acute bony abnormality. Specifically, no fracture, subluxation, or dislocation. Mild degenerative changes in the [facility] joint. IMPRESSION: No acute bony abnormality. Electronically Signed   By: Rolm Baptise M.D.   On: 08/20/2022 02:12   DG Chest Port 1 View  Result Date: 08/20/2022 CLINICAL DATA:  Fall EXAM: PORTABLE CHEST 1 VIEW COMPARISON:  08/03/2022 FINDINGS: Heart is normal size. Aortic atherosclerosis. Moderate-sized hiatal hernia. No confluent airspace opacities, effusions or pneumothorax. No acute bony abnormality. IMPRESSION: No acute cardiopulmonary disease. Hiatal hernia. Electronically Signed   By: Rolm Baptise M.D.   On: 08/20/2022 02:12    Procedures Procedures    Medications Ordered in ED Medications  cephALEXin (KEFLEX) capsule 1,000 mg (has no administration in time range)   acetaminophen (TYLENOL) tablet 1,000 mg (1,000 mg Oral Given 08/20/22 0135)    ED Course/ Medical Decision Making/ A&P                           Medical Decision Making Amount and/or Complexity of Data Reviewed Labs: ordered. Radiology: ordered.  Risk OTC drugs. Prescription drug management.   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  Sounds like she has had a bit of a progressive decline over the past month or so based on her recent ED visits on my record review.  Was found on the ground per EMS in the bathroom.  She has a hematoma to the left frontal region.  We will obtain a CT of the head and C-spine.  Plain film of the left shoulder.  Plain film of the chest and a UA to assess for infection.  Lab work.  Reassess.  Family has arrived and states that the patient has been slightly off recently.  They endorse urinary changes in the past 24 hours or so.  They think the patient had disabled her bed alarm and got up and tried to do something on her own and then fell.  They do not think she had a prolonged time on the ground.   CT of the head and C-spine are negative for acute intracranial or intraspinal pathology.  Patient's UA is concerning for urinary tract infection and is nitrite positive with too numerous to count whites.  Rare bacteria and no squames.  Will start on antibiotics.  No leukocytosis no anemia no significant electrolyte abnormalities CK is normal.  I discussed results with the patient and family.  Encouraged him to follow-up with their family doctor.  3:37 AM:  I have discussed the diagnosis/risks/treatment options with the patient and family.  Evaluation and diagnostic testing in the emergency department does not suggest an emergent condition requiring admission or immediate intervention beyond what has been performed at this time.  They will follow up with PCP.  We also discussed returning to the ED immediately if new or worsening sx occur. We discussed the sx which are most concerning (e.g., sudden worsening pain, fever, inability to tolerate by mouth) that necessitate immediate return. Medications administered to the patient during their visit and any new prescriptions  provided to the patient are listed below.  Medications given during this visit Medications  cephALEXin (KEFLEX) capsule 1,000 mg (has no administration in time range)  acetaminophen (TYLENOL) tablet 1,000 mg (1,000 mg Oral Given 08/20/22 0135)     The patient appears reasonably screen and/or stabilized for discharge and I doubt any other medical condition or other [facility] requiring further screening, evaluation, or treatment in the ED at this time prior to discharge.         Final Clinical Impression(s) / ED Diagnoses Final diagnoses:  Fall, initial encounter  Acute cystitis without hematuria    Rx / DC Orders ED Discharge Orders          Ordered    cephALEXin (KEFLEX) 500 MG capsule        08/20/22 0326              Melene Plan, DO 08/20/22 [phone]

## 2022-08-20 NOTE — ED Triage Notes (Signed)
Pt bib [facility] from home for unwitnessed fall at unknown time. Pt found by caregiver on tile floor. Denies thinners. Hx of dementia. C collar in place on arrival.   BP 178/102, HR 84, Spo2 95% RA, CBG 135

## 2022-08-20 NOTE — Discharge Instructions (Signed)
Follow up with your doctor in the office.  Return for fever, inability to eat or drink, or concern for safety at home.

## 2022-08-22 LAB — URINE CULTURE: Culture: >=100000 — AB

## 2022-08-23 ENCOUNTER — Telehealth (HOSPITAL_BASED_OUTPATIENT_CLINIC_OR_DEPARTMENT_OTHER): Payer: Self-pay | Admitting: *Deleted

## 2022-08-23 NOTE — Telephone Encounter (Signed)
Post ED Visit - Positive Culture Follow-up  Culture report reviewed by antimicrobial stewardship pharmacist: [facility] Team []  Elenor Quinones, Pharm.D. []  Heide Guile, Pharm.D., BCPS AQ-ID []  Parks Neptune, Pharm.D., BCPS []  Alycia Rossetti, Pharm.D., BCPS []  [place], [place].D., BCPS, AAHIVP []  Legrand Como, Pharm.D., BCPS, AAHIVP []  Salome Arnt, PharmD, BCPS []  Johnnette Gourd, PharmD, BCPS []  Hughes Better, PharmD, BCPS []  Leeroy Cha, PharmD []  Laqueta Linden, PharmD, BCPS [x]  Jeneen Rinks, PharmD  [place] Team []  Leodis Sias, PharmD []  Lindell Spar, PharmD []  Royetta Asal, PharmD []  Graylin Shiver, Rph []  Rema Fendt) Glennon Mac, PharmD []  Arlyn Dunning, PharmD []  Netta Cedars, PharmD []  Dia Sitter, PharmD []  Leone Haven, PharmD []  Gretta Arab, PharmD []  Theodis Shove, PharmD []  Peggyann Juba, PharmD []  Reuel Boom, PharmD   Positive urine culture Treated with Cephalexin, organism sensitive to the same and no further patient follow-up is required at this time.  Rosie Fate 08/23/2022, 8:14 AM

## 2022-08-25 ENCOUNTER — Ambulatory Visit (INDEPENDENT_AMBULATORY_CARE_PROVIDER_SITE_OTHER): Payer: Medicare Other | Admitting: Neurology

## 2022-08-25 ENCOUNTER — Encounter: Payer: Self-pay | Admitting: Neurology

## 2022-08-25 VITALS — BP 150/84 | HR 90 | Ht 66.0 in | Wt 125.0 lb

## 2022-08-25 DIAGNOSIS — G244 Idiopathic orofacial dystonia: Secondary | ICD-10-CM

## 2022-08-25 DIAGNOSIS — I890 Lymphedema, not elsewhere classified: Secondary | ICD-10-CM | POA: Diagnosis not present

## 2022-08-25 MED ORDER — INCOBOTULINUMTOXINA 50 UNITS IM SOLR
50.0000 [IU] | INTRAMUSCULAR | Status: AC
  Administered 2022-08-25: 50 [IU] via INTRAMUSCULAR

## 2022-08-25 NOTE — Progress Notes (Signed)
Chief Complaint  Patient presents with   Procedure    Room 15, xeomin 50      ASSESSMENT AND PLAN  Susan Henry is a 88 y.o. female Meige's syndrome Dementia, difficulty sleeping Gait abnormality  Her gait abnormality multifactorial, aging, deconditioning, left arm and leg weakness, may be a component of cervical myelopathy  Gabapentin 50 mg tablets 1 to 2 tablets as needed for for sleeping  EMG guided xeomin injection for facial spasm, used xeomin 50 minutes today  Bilateral orbicularis oculi at 2, 3, 4, 5, 6, 7, 8 o'clock position (2.5 units x7=17.5 units)  Bilateral corrugate 5 units each Bilateral frontalis 5 units   DIAGNOSTIC DATA (LABS, IMAGING, TESTING) - I reviewed patient records, labs, notes, testing and imaging myself where available.   MEDICAL HISTORY:  Susan Henry, is a 88 year old female, seen in request by primary care doctor  Garth Bigness for evaluation of facial grimace, dementia, initial evaluation was on July 15, 2022 with her daughter  I reviewed and summarized the referring note. PMHX.  Hypertension Dementia  Patient currently lives at nursing facility, had gradual worsening memory loss, still able to carry on simple conversation, tends to bite on her fingernails during today's interview, rely on her daughter to provide history, but pleasant, there frequent facial grimace,  She was seen by movement specialist Dr. Arbutus Leas  since 2016, reviewed her chart,  Reported more than 30 years history of gradual worsening facial abnormal movement, frequent eye blinking, spreading to lower face, involuntary grimace, moving towels inside her mouth, she had slow worsening memory loss,   She was diagnosed with Meige syndrome, began to receive Botox injection around 2018, responding some,  She is pleasant most of the time, but has difficulty sleeping sometimes, taking gabapentin 100 mg, if she take 2 tablets, she often get up lightheaded noted  increased difficulty walking,  Last injection was Dr. Lurena Joiner Tat was on January 12, 2022, used Botox a 30 units, injection was mainly around bilateral orbicularis oculi,  Personally reviewed CT cervical without contrast January 29, 2022: No acute abnormality, rotation of C1 on C2, anterolisthesis of C4 on C5, multilevel degenerative changes greatest at C5-6, enlarged heterogeneous thyroid,  CT head, mild atrophy, small vessel disease no acute abnormality  UPDATE Aug 25 2022: She is with her daughter, uses xeomin 50 units today  PHYSICAL EXAM:   Today's Vitals   08/25/22 1618  BP: (!) 150/84  Pulse: 90  Weight: 125 lb (56.7 kg)  Height: 5\' 6"  (1.676 m)   Body mass index is 20.18 kg/m.   PHYSICAL EXAMNIATION:   Awake, alert, hard of hearing, biting her nails, reliant on her daughter to provide history, frequent grimace, facial dystonic movement involving cheek muscles, also platysmas  REVIEW OF SYSTEMS:  Full 14 system review of systems performed and notable only for as above All other review of systems were negative.   ALLERGIES: Allergies  Allergen Reactions   Alprazolam Rash    HOME MEDICATIONS: Current Outpatient Medications  Medication Sig Dispense Refill   amLODipine (NORVASC) 5 MG tablet Take 5 mg by mouth every morning.     cephALEXin (KEFLEX) 500 MG capsule 2 caps po bid x 7 days 28 capsule 0   citalopram (CELEXA) 20 MG tablet Take 20 mg by mouth every morning.     gabapentin (NEURONTIN) 100 MG capsule Take 100-300 mg by mouth at bedtime as needed.     gabapentin (NEURONTIN) 100 MG capsule Take 1 capsule (  100 mg total) by mouth at bedtime as needed. 90 capsule 3   Gabapentin 50 MG TABS Take 200 mg by mouth at bedtime as needed. 120 tablet 11   lisinopril (PRINIVIL,ZESTRIL) 20 MG tablet Take 20 mg by mouth every morning.     methenamine (MANDELAMINE) 0.5 GM tablet Take 500 mg by mouth 2 (two) times daily.     Multiple Vitamins-Minerals (PRESERVISION AREDS 2) CAPS  Take 2 capsules by mouth every morning.     naproxen sodium (ALEVE) 220 MG tablet Take 220 mg by mouth 2 (two) times daily as needed (back pain).     pantoprazole (PROTONIX) 40 MG tablet Take 1 tablet (40 mg total) by mouth daily. 30 tablet 0   polyethylene glycol (MIRALAX / GLYCOLAX) 17 g packet Take 17 g by mouth daily as needed for mild constipation or moderate constipation. 14 each 0   Probiotic Product (PROBIOTIC PO) Take 1 capsule by mouth daily. 60 billion probiotic     Vitamin D, Ergocalciferol, (DRISDOL) 1.25 MG (50000 UNIT) CAPS capsule Take 50,000 Units by mouth every Thursday.     No current facility-administered medications for this visit.    PAST MEDICAL HISTORY: Past Medical History:  Diagnosis Date   Allergy    Seasonal   Anemia    Iron deficiency    Anxiety    Aortic sclerosis    Arthritis    Blood transfusion    Colon polyps    Hx of  adenomatous   Depression    Diverticulosis    GERD (gastroesophageal reflux disease)    Hemorrhoids, internal, with bleeding 01/08/2011   Hypertension    Insomnia    Pneumonia    Reflux     PAST SURGICAL HISTORY: Past Surgical History:  Procedure Laterality Date   CHOLECYSTECTOMY     COLON SURGERY     COLONOSCOPY  01/2011   COLONOSCOPY  11/18/2011   Procedure: COLONOSCOPY;  Surgeon: Owens Loffler, MD;  Location: [facility];  Service: Endoscopy;  Laterality: N/A;   COLONOSCOPY  11/24/2011   Procedure: COLONOSCOPY;  Surgeon: Zenovia Jarred, MD;  Location: [facility];  Service: Gastroenterology;  Laterality: N/A;   ESOPHAGOGASTRODUODENOSCOPY  11/18/2011   Procedure: ESOPHAGOGASTRODUODENOSCOPY (EGD);  Surgeon: Owens Loffler, MD;  Location: [facility];  Service: Endoscopy;  Laterality: N/A;   LOW ANTERIOR BOWEL RESECTION  1996   diverticulitis   UPPER GASTROINTESTINAL ENDOSCOPY      FAMILY HISTORY: Family History  Problem Relation Age of Onset   Pancreatic cancer Father    Colon cancer Neg Hx    Anesthesia problems Neg Hx     Hypotension Neg Hx    Malignant hyperthermia Neg Hx    Pseudochol deficiency Neg Hx     SOCIAL HISTORY: Social History   Socioeconomic History   Marital status: Widowed    Spouse name: Not on file   Number of children: Not on file   Years of education: Not on file   Highest education level: Not on file  Occupational History   Occupation: Unemployed     Comment: Homemaker  Tobacco Use   Smoking status: Never   Smokeless tobacco: Never  Vaping Use   Vaping Use: Never used  Substance and Sexual Activity   Alcohol use: Yes    Alcohol/week: 0.0 standard drinks of alcohol    Comment: occasional   Drug use: No   Sexual activity: Never  Other Topics Concern   Not on file  Social History Narrative   Right  handed   1 story    Lives with spouse   Social Determinants of Health   Financial Resource Strain: Not on file  Food Insecurity: Not on file  Transportation Needs: Not on file  Physical Activity: Not on file  Stress: Not on file  Social Connections: Not on file  Intimate Partner Violence: Not on file      Marcial Pacas, M.D. Ph.D.  [facility] [address], [place], [place] [address] Ph: [phone] Fax: [phone]  CC:  Glenis Smoker, MD [place],  [place] [address]  Glenis Smoker, MD

## 2022-08-25 NOTE — Progress Notes (Signed)
Xeomin 50 units x 1 vial  [id number] [id number] Exp-10/2022 B/B 

## 2022-11-17 NOTE — Telephone Encounter (Signed)
Can you please do a new botox PA she now only has [place] thanks

## 2022-11-18 ENCOUNTER — Other Ambulatory Visit (HOSPITAL_COMMUNITY): Payer: Self-pay

## 2022-11-18 NOTE — Telephone Encounter (Signed)
Benefits verification entered into Xeomin portal-will f/u with result when available.

## 2022-11-18 NOTE — Telephone Encounter (Signed)
Let us know if you need anything further on this.

## 2022-11-23 ENCOUNTER — Other Ambulatory Visit (HOSPITAL_COMMUNITY): Payer: Self-pay

## 2022-11-23 NOTE — Telephone Encounter (Signed)
Pt is already on schedule for 12/01/22.

## 2022-11-23 NOTE — Telephone Encounter (Signed)
This is buy and bill.

## 2022-11-23 NOTE — Telephone Encounter (Signed)
Pharmacy Patient Advocate Encounter  Prior Authorization for Xeomin 100 units has been approved.    PA# [id number] Effective dates: 12/01/2022 through 12/02/2023

## 2022-11-24 ENCOUNTER — Telehealth: Payer: Self-pay | Admitting: Neurology

## 2022-11-24 NOTE — Telephone Encounter (Signed)
Pt's daughter, Rise Vidovich cancelling Botox appt due to pt having druppy eye with eyelashes turning in. Occuloplastic to have her eyes checked

## 2022-12-01 ENCOUNTER — Ambulatory Visit: Payer: Medicare Other | Admitting: Neurology

## 2023-07-19 DEATH — deceased
